# Patient Record
Sex: Male | Born: 1985 | Race: White | Hispanic: No | Marital: Married | Smoking: Never smoker
Health system: Southern US, Community
[De-identification: ages and names within clinical notes are randomized; demographics above are authoritative.]

## PROBLEM LIST (undated history)

## (undated) DIAGNOSIS — I861 Scrotal varices: Secondary | ICD-10-CM

## (undated) DIAGNOSIS — J45909 Unspecified asthma, uncomplicated: Secondary | ICD-10-CM

## (undated) DIAGNOSIS — Z8669 Personal history of other diseases of the nervous system and sense organs: Secondary | ICD-10-CM

## (undated) DIAGNOSIS — G4733 Obstructive sleep apnea (adult) (pediatric): Secondary | ICD-10-CM

## (undated) DIAGNOSIS — K219 Gastro-esophageal reflux disease without esophagitis: Secondary | ICD-10-CM

## (undated) DIAGNOSIS — J309 Allergic rhinitis, unspecified: Secondary | ICD-10-CM

## (undated) HISTORY — DX: Personal history of other diseases of the nervous system and sense organs: Z86.69

## (undated) HISTORY — PX: EYE SURGERY: SHX253

## (undated) HISTORY — PX: TESTICLE SURGERY: SHX794

## (undated) HISTORY — DX: Gastro-esophageal reflux disease without esophagitis: K21.9

## (undated) HISTORY — PX: TONSILLECTOMY AND ADENOIDECTOMY: SUR1326

## (undated) HISTORY — DX: Allergic rhinitis, unspecified: J30.9

## (undated) HISTORY — DX: Scrotal varices: I86.1

## (undated) HISTORY — DX: Unspecified asthma, uncomplicated: J45.909

## (undated) HISTORY — DX: Obstructive sleep apnea (adult) (pediatric): G47.33

---

## 2013-08-30 ENCOUNTER — Ambulatory Visit (INDEPENDENT_AMBULATORY_CARE_PROVIDER_SITE_OTHER): Payer: 59 | Admitting: Family Medicine

## 2013-08-30 ENCOUNTER — Encounter: Payer: Self-pay | Admitting: Family Medicine

## 2013-08-30 ENCOUNTER — Encounter: Payer: Self-pay | Admitting: *Deleted

## 2013-08-30 VITALS — BP 120/78 | HR 78 | Temp 98.1°F | Ht 73.75 in | Wt 320.0 lb

## 2013-08-30 DIAGNOSIS — K219 Gastro-esophageal reflux disease without esophagitis: Secondary | ICD-10-CM

## 2013-08-30 DIAGNOSIS — J309 Allergic rhinitis, unspecified: Secondary | ICD-10-CM

## 2013-08-30 DIAGNOSIS — I861 Scrotal varices: Secondary | ICD-10-CM

## 2013-08-30 DIAGNOSIS — Z0000 Encounter for general adult medical examination without abnormal findings: Secondary | ICD-10-CM

## 2013-08-30 DIAGNOSIS — E669 Obesity, unspecified: Secondary | ICD-10-CM

## 2013-08-30 DIAGNOSIS — Z8669 Personal history of other diseases of the nervous system and sense organs: Secondary | ICD-10-CM

## 2013-08-30 DIAGNOSIS — J45909 Unspecified asthma, uncomplicated: Secondary | ICD-10-CM

## 2013-08-30 HISTORY — DX: Personal history of other diseases of the nervous system and sense organs: Z86.69

## 2013-08-30 HISTORY — DX: Scrotal varices: I86.1

## 2013-08-30 HISTORY — DX: Allergic rhinitis, unspecified: J30.9

## 2013-08-30 LAB — LIPID PANEL
CHOL/HDL RATIO: 4
Cholesterol: 170 mg/dL (ref 0–200)
HDL: 38.4 mg/dL — ABNORMAL LOW (ref >39.00)
LDL CALC: 109 mg/dL — AB (ref 0–99)
Triglycerides: 113 mg/dL (ref 0.0–149.0)
VLDL: 22.6 mg/dL (ref 0.0–40.0)

## 2013-08-30 LAB — HEMOGLOBIN A1C: Hgb A1c MFr Bld: 5.1 % (ref 4.6–6.5)

## 2013-08-30 LAB — TSH: TSH: 0.89 u[IU]/mL (ref 0.35–4.50)

## 2013-08-30 NOTE — Patient Instructions (Signed)
-  We placed a referral for you as discussed to the urologist. It usually takes about 1-2 weeks to process and schedule this referral. If you have not heard from [place] regarding this appointment in 2 weeks please contact our office.  -We have ordered labs or studies at this visit. It can take up to 1-2 weeks for results and processing. We will contact you with instructions IF your results are abnormal. Normal results will be released to your [facility]. If you have not heard from [place] or can not find your results in [facility] in 2 weeks please contact our office.  -PLEASE SIGN UP FOR MYCHART TODAY   We recommend the following healthy lifestyle measures: - eat a healthy diet consisting of lots of vegetables, fruits, beans, nuts, seeds, healthy meats such as white chicken and fish and whole grains.  - avoid fried foods, fast food, processed foods, sodas, red meet and other fattening foods.  - get a least 150 minutes of aerobic exercise per week.   Follow up in: 1 year and as needed

## 2013-08-30 NOTE — Progress Notes (Signed)
No chief complaint on file.   HPI:  Sean Allen is here to establish care. Recently exercising more and in wellness program. Wants referral for varicocele. Evaluated remotely about 8 years ago and surgery offered vs conservative management. Now giving him a  Little trouble from time to time and wants to see urologist here.  Last PCP and physical:   Has the following chronic problems and concerns today:  Patient Active Problem List   Diagnosis Date Noted  . Varicocele, evaluated by urologist in the past 08/30/2013  . Hx of retinal detachment - [facility] 08/30/2013  . Asthma - sees Allergy and Asthma specialist 08/30/2013  . Allergic rhinitis 08/30/2013  . GERD (gastroesophageal reflux disease) 08/30/2013   Health Maintenance: -reports needs physical  ROS: See pertinent positives and negatives per HPI.  Past Medical History  Diagnosis Date  . Asthma   . GERD (gastroesophageal reflux disease)   . Varicocele, evaluated by urologist in the past 08/30/2013  . Allergic rhinitis 08/30/2013  . Hx of retinal detachment - [facility] 08/30/2013    Family History  Problem Relation Age of Onset  . Hyperlipidemia Father   . Hypertension Paternal Grandmother     History   Social History  . Marital Status: Single    Spouse Name: N/A    Number of Children: N/A  . Years of Education: N/A   Social History Main Topics  . Smoking status: Never Smoker   . Smokeless tobacco: None  . Alcohol Use: Yes     Comment: occ 1-2 drinks  . Drug Use: None  . Sexual Activity: None   Other Topics Concern  . 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: lives alone      Spiritual Beliefs: Christian      Lifestyle: regular CV exercise; working on a healthy diet                Current outpatient prescriptions:budesonide-formoterol (SYMBICORT) 160-4.5 MCG/ACT inhaler, Inhale 2 puffs into the lungs 2 (two) times  daily., Disp: , Rfl: ;  esomeprazole (NEXIUM) 40 MG capsule, Take 40 mg by mouth daily at 12 noon., Disp: , Rfl: ;  levocetirizine (XYZAL) 5 MG tablet, Take 5 mg by mouth every evening., Disp: , Rfl: ;  montelukast (SINGULAIR) 10 MG tablet, Take 10 mg by mouth at bedtime., Disp: , Rfl:  Olopatadine HCl (PATANASE NA), Place into the nose daily., Disp: , Rfl:   EXAM:  Filed Vitals:   08/30/13 0831  BP: 120/78  Pulse: 78  Temp: 98.1 F (36.7 C)    Body mass index is 41.38 kg/(m^2).  GENERAL: vitals reviewed and listed above, alert, oriented, appears well hydrated and in no acute distress  HEENT: atraumatic, conjunttiva clear, no obvious abnormalities on inspection of external nose and ears  NECK: no obvious masses on inspection  LUNGS: clear to auscultation bilaterally, no wheezes, rales or rhonchi, good air movement  CV: HRRR, no peripheral edema  MS: moves all extremities without noticeable abnormality  PSYCH: pleasant and cooperative, no obvious depression or anxiety  ASSESSMENT AND PLAN:  Discussed the following assessment and plan:  Varicocele, evaluated bu urologist in the past - Plan: Ambulatory referral to Urology  Hx of retinal detachment - [facility]  Asthma -followed by Asthma and Allergy specialist Allergic rhinitis  GERD (gastroesophageal reflux disease)  Encounter for preventive health examination - Plan: Lipid Panel, Hemoglobin  A1c, TSH  Obesity  -We reviewed the PMH, PSH, FH, SH, Meds and Allergies. -We provided refills for any medications we will prescribe as needed. -We addressed current concerns per orders and patient instructions. -We have asked for records for pertinent exams, studies, vaccines and notes from previous providers. -We have advised patient to follow up per instructions below. -FASTING labs -USPSTF level A and B recs advised, declined STI testing  -Patient advised to return or notify a doctor immediately if symptoms  worsen or persist or new concerns arise.  Patient Instructions  -We placed a referral for you as discussed to the urologist. It usually takes about 1-2 weeks to process and schedule this referral. If you have not heard from [place] regarding this appointment in 2 weeks please contact our office.  -We have ordered labs or studies at this visit. It can take up to 1-2 weeks for results and processing. We will contact you with instructions IF your results are abnormal. Normal results will be released to your [facility]. If you have not heard from [place] or can not find your results in [facility] in 2 weeks please contact our office.  -PLEASE SIGN UP FOR MYCHART TODAY   We recommend the following healthy lifestyle measures: - eat a healthy diet consisting of lots of vegetables, fruits, beans, nuts, seeds, healthy meats such as white chicken and fish and whole grains.  - avoid fried foods, fast food, processed foods, sodas, red meet and other fattening foods.  - get a least 150 minutes of aerobic exercise per week.   Follow up in: 1 year and as needed       Lucretia Kern

## 2013-08-30 NOTE — Progress Notes (Signed)
Pre visit review using our clinic review tool, if applicable. No additional management support is needed unless otherwise documented below in the visit note. 

## 2013-09-10 ENCOUNTER — Encounter: Payer: Self-pay | Admitting: *Deleted

## 2013-10-14 ENCOUNTER — Encounter: Payer: Self-pay | Admitting: *Deleted

## 2013-11-04 ENCOUNTER — Encounter: Payer: Self-pay | Admitting: *Deleted

## 2013-12-07 ENCOUNTER — Ambulatory Visit (INDEPENDENT_AMBULATORY_CARE_PROVIDER_SITE_OTHER): Payer: 59 | Admitting: Family Medicine

## 2013-12-07 ENCOUNTER — Encounter: Payer: Self-pay | Admitting: Family Medicine

## 2013-12-07 VITALS — BP 120/82 | HR 78 | Temp 97.9°F | Ht 73.75 in | Wt 321.5 lb

## 2013-12-07 DIAGNOSIS — J45901 Unspecified asthma with (acute) exacerbation: Secondary | ICD-10-CM

## 2013-12-07 DIAGNOSIS — J988 Other specified respiratory disorders: Secondary | ICD-10-CM

## 2013-12-07 MED ORDER — ALBUTEROL SULFATE HFA 108 (90 BASE) MCG/ACT IN AERS: 2.0000 | INHALATION_SPRAY | RESPIRATORY_TRACT | Status: DC | PRN

## 2013-12-07 MED ORDER — PREDNISONE 20 MG PO TABS: 40.0000 mg | ORAL_TABLET | Freq: Every day | ORAL | Status: DC

## 2013-12-07 MED ORDER — AZITHROMYCIN 250 MG PO TABS: ORAL_TABLET | ORAL | Status: DC

## 2013-12-07 NOTE — Patient Instructions (Signed)
-  As we discussed, we have prescribed a new medication for you at this appointment. We discussed the common and serious potential adverse effects of this medication and you can review these and more with the pharmacist when you pick up your medication.  Please follow the instructions for use carefully and notify us immediately if you have any problems taking this medication.  Melissa Montane for 4 days  -allergy and asthma medications daily  -tyelnol or naproxen per instructions as needed  -follow up as needed and if persists or worsens

## 2013-12-07 NOTE — Progress Notes (Signed)
Pre visit review using our clinic review tool, if applicable. No additional management support is needed unless otherwise documented below in the visit note. 

## 2013-12-07 NOTE — Progress Notes (Signed)
No chief complaint on file.   HPI:  -started: 3-4 days ago, cough is worsening and prouctive -symptoms:nasal congestion, sore throat, cough, chills, malaise -denies:fever, SOB, NVD, tooth pain -has tried: sudafed -sick contacts/travel/risks: denies flu exposure, tick exposure or or Ebola risks -Hx of: allergies, asthma - seeing specialist - meds include patanase, xyzal, symbicort and proair ROS: See pertinent positives and negatives per HPI.  Past Medical History  Diagnosis Date  . Asthma   . GERD (gastroesophageal reflux disease)   . Varicocele, evaluated by urologist in the past 08/30/2013  . Allergic rhinitis 08/30/2013  . Hx of retinal detachment - [facility] 08/30/2013    Past Surgical History  Procedure Laterality Date  . Tonsillectomy and adenoidectomy    . Eye surgery      bilateral retinal detachment  . Testicle surgery      Family History  Problem Relation Age of Onset  . Hyperlipidemia Father   . Hypertension Paternal Grandmother     History   Social History  . Marital Status: Single    Spouse Name: N/A    Number of Children: N/A  . Years of Education: N/A   Social History Main Topics  . Smoking status: Never Smoker   . Smokeless tobacco: None  . Alcohol Use: Yes     Comment: occ 1-2 drinks  . Drug Use: None  . Sexual Activity: None   Other Topics Concern  . 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: lives alone      Spiritual Beliefs: Christian      Lifestyle: regular CV exercise; working on a healthy diet                Current outpatient prescriptions:budesonide-formoterol (SYMBICORT) 160-4.5 MCG/ACT inhaler, Inhale 2 puffs into the lungs 2 (two) times daily., Disp: , Rfl: ;  esomeprazole (NEXIUM) 40 MG capsule, Take 40 mg by mouth daily at 12 noon., Disp: , Rfl: ;  levocetirizine (XYZAL) 5 MG tablet, Take 5 mg by mouth every evening., Disp: , Rfl: ;  montelukast (SINGULAIR) 10 MG  tablet, Take 10 mg by mouth at bedtime., Disp: , Rfl:  NON FORMULARY, Allergy injections, Disp: , Rfl: ;  Olopatadine HCl (PATANASE NA), Place into the nose daily., Disp: , Rfl: ;  Pseudoephedrine HCl (SUDAFED PO), Take by mouth., Disp: , Rfl: ;  albuterol (PROAIR HFA) 108 (90 BASE) MCG/ACT inhaler, Inhale 2 puffs into the lungs every 4 (four) hours as needed for wheezing or shortness of breath., Disp: 1 Inhaler, Rfl: 0 azithromycin (ZITHROMAX) 250 MG tablet, 2 tabs on first day then one tab daily, Disp: 6 tablet, Rfl: 0;  predniSONE (DELTASONE) 20 MG tablet, Take 2 tablets (40 mg total) by mouth daily with breakfast., Disp: 8 tablet, Rfl: 0  EXAM:  Filed Vitals:   12/07/13 1530  BP: 120/82  Pulse: 78  Temp: 97.9 F (36.6 C)    Body mass index is 41.57 kg/(m^2).  GENERAL: vitals reviewed and listed above, alert, oriented, appears well hydrated and in no acute distress  HEENT: atraumatic, conjunttiva clear, no obvious abnormalities on inspection of external nose and ears, normal appearance of ear canals and TMs, clear nasal congestion, mild post oropharyngeal erythema with PND, no tonsillar edema or exudate, no sinus TTP  NECK: no obvious masses on inspection  LUNGS: clear to auscultation bilaterally, no wheezes, rales or rhonchi, good air movement  CV: HRRR, no  peripheral edema  MS: moves all extremities without noticeable abnormality  PSYCH: pleasant and cooperative, no obvious depression or anxiety  ASSESSMENT AND PLAN:  Discussed the following assessment and plan:  Asthma exacerbation - Plan: predniSONE (DELTASONE) 20 MG tablet, azithromycin (ZITHROMAX) 250 MG tablet, albuterol (PROAIR HFA) 108 (90 BASE) MCG/ACT inhaler  Respiratory infection - Plan: predniSONE (DELTASONE) 20 MG tablet, azithromycin (ZITHROMAX) 250 MG tablet, albuterol (PROAIR HFA) 108 (90 BASE) MCG/ACT inhaler  -given HPI and exam findings today, a serious infection or illness is unlikely. We discussed  potential etiologies, with VURI being most likely, and advised supportive care and monitoring. We discussed treatment side effects, likely course, antibiotic misuse, transmission, and signs of developing a serious illness. -of course, we advised to return or notify a doctor immediately if symptoms worsen or persist or new concerns arise.    Patient Instructions  -As we discussed, we have prescribed a new medication for you at this appointment. We discussed the common and serious potential adverse effects of this medication and you can review these and more with the pharmacist when you pick up your medication.  Please follow the instructions for use carefully and notify us immediately if you have any problems taking this medication.  Melissa Montane for 4 days  -allergy and asthma medications daily  -tyelnol or naproxen per instructions as needed  -follow up as needed and if persists or worsens     Bertha Lokken R.

## 2013-12-10 ENCOUNTER — Encounter: Payer: 59 | Attending: Family Medicine | Admitting: *Deleted

## 2013-12-10 ENCOUNTER — Encounter: Payer: Self-pay | Admitting: *Deleted

## 2013-12-10 VITALS — Ht 73.75 in | Wt 319.3 lb

## 2013-12-10 DIAGNOSIS — E669 Obesity, unspecified: Secondary | ICD-10-CM | POA: Insufficient documentation

## 2013-12-10 DIAGNOSIS — Z713 Dietary counseling and surveillance: Secondary | ICD-10-CM | POA: Diagnosis present

## 2013-12-10 DIAGNOSIS — Z6841 Body Mass Index (BMI) 40.0 and over, adult: Secondary | ICD-10-CM | POA: Insufficient documentation

## 2013-12-10 NOTE — Progress Notes (Signed)
Medical Nutrition Therapy:  Appt start time: 0830 end time:  0930.  Assessment:  Patient here today for weight management. Patient is a Adult nurse. He reports that he would like to lose about 100 pounds to improve health. He has recently cut back on soda intake, limits fried foods, eating out less, and increasing intake of vegetables/salads. He has started exercising 3 days weekly at [facility] for at least 30 minutes, and would like to start doing strength exercises. He did lose some weight, but reports that he has gained it back. He is still drinking sweetened drinks (sweet tea, Gatorade) regularly. He would like to lose 100 pounds in 1 year, and while this is possible given patient's calorie requirements, this may not be realistic. A goal of 50 pound weight loss in 6-9 months was set.   MEDICATIONS: See list   DIETARY INTAKE:   Usual eating pattern includes 3 meals and 0-1 snacks per day.  24-hr recall:  B ( AM): English muffin or bagel, peanut butter, homemade jelly OR frosted mini wheat cereal/grits/oatmeal - 1-2 packets  Snk ( AM): None  L ( PM): Sandwich, [place] yogurt, raisins, individual cheese packet, occasionally chips, water and Gatorade Snk ( PM): Chips and salsa, M&M's on weekends D ( PM): Eats out ([place]), grilled chicken/deli meat salad Snk ( PM): None Beverages: Water, Gatorade daily (16-20 oz bottles), coffee (1 tsp cream, sugar), sweet tea (3 quarts in 2-3 weeks)  Usual physical activity: [employer] gym 3 days weekly, cardio 30 minutes, getting into weights  Estimated energy needs: 2000 calories 250 g carbohydrates 125 g protein 56 g fat  Progress Towards Goal(s):  In progress.   Nutritional Diagnosis:  [place]-3.3 Overweight/obesity As related to excessive energy intake from large portions and sweetened drinks.  As evidenced by BMI >30.    Intervention:  Nutrition counseling. We discussed strategies for weight loss, including balancing nutrients  (carbs, protein, fat), portion control, healthy snacks, and exercise. We also discussed realistic expectations for weight loss.   Goals:  1. 1-2 pounds weight loss per week.  2. Limit intake of sweet drinks to <20 ounces daily, cutting this back further if not losing weight.  3. Balance protein, carbs, fat, and vegetables at meals.  4. Monitor portion size.  5. Increase exercise to cardio 45-60 minutes 4-5 days weekly, strength training 2-3 days weekly.   Handouts given during visit include:  Weight loss tips  5 day 1800 calorie meal plan  Meal plan card  Monitoring/Evaluation:  Dietary intake, exercise, and body weight prn.

## 2014-08-31 ENCOUNTER — Encounter: Payer: 59 | Admitting: Family Medicine

## 2014-12-06 ENCOUNTER — Ambulatory Visit (INDEPENDENT_AMBULATORY_CARE_PROVIDER_SITE_OTHER): Payer: 59 | Admitting: Family Medicine

## 2014-12-06 ENCOUNTER — Encounter: Payer: Self-pay | Admitting: Family Medicine

## 2014-12-06 VITALS — BP 120/76 | HR 89 | Temp 98.1°F | Ht 75.0 in | Wt 314.3 lb

## 2014-12-06 DIAGNOSIS — R0683 Snoring: Secondary | ICD-10-CM | POA: Diagnosis not present

## 2014-12-06 DIAGNOSIS — Z0000 Encounter for general adult medical examination without abnormal findings: Secondary | ICD-10-CM | POA: Diagnosis not present

## 2014-12-06 DIAGNOSIS — K219 Gastro-esophageal reflux disease without esophagitis: Secondary | ICD-10-CM | POA: Diagnosis not present

## 2014-12-06 DIAGNOSIS — G4710 Hypersomnia, unspecified: Secondary | ICD-10-CM

## 2014-12-06 DIAGNOSIS — J309 Allergic rhinitis, unspecified: Secondary | ICD-10-CM

## 2014-12-06 DIAGNOSIS — Z23 Encounter for immunization: Secondary | ICD-10-CM

## 2014-12-06 DIAGNOSIS — R400 Somnolence: Secondary | ICD-10-CM

## 2014-12-06 LAB — BASIC METABOLIC PANEL
BUN: 13 mg/dL (ref 6–23)
CO2: 31 meq/L (ref 19–32)
CREATININE: 1.14 mg/dL (ref 0.40–1.50)
Calcium: 9.8 mg/dL (ref 8.4–10.5)
Chloride: 102 mEq/L (ref 96–112)
GFR: 81.6 mL/min (ref >60.00)
GLUCOSE: 92 mg/dL (ref 70–99)
Potassium: 4.8 mEq/L (ref 3.5–5.1)
Sodium: 138 mEq/L (ref 135–145)

## 2014-12-06 LAB — LIPID PANEL
CHOLESTEROL: 168 mg/dL (ref 0–200)
HDL: 45.7 mg/dL (ref >39.00)
LDL CALC: 105 mg/dL — AB (ref 0–99)
NonHDL: 122.25
TRIGLYCERIDES: 87 mg/dL (ref 0.0–149.0)
Total CHOL/HDL Ratio: 4
VLDL: 17.4 mg/dL (ref 0.0–40.0)

## 2014-12-06 LAB — HEMOGLOBIN A1C: Hgb A1c MFr Bld: 5.4 % (ref 4.6–6.5)

## 2014-12-06 NOTE — Progress Notes (Signed)
Pre visit review using our clinic review tool, if applicable. No additional management support is needed unless otherwise documented below in the visit note. 

## 2014-12-06 NOTE — Progress Notes (Signed)
HPI:  Here for CPE:  -Concerns and/or follow up today:  GERD/asthma: -on protonix 40mg  from his allergist -denies any hx of stricture or esophagitis -he wants to stop PPI but reports his asthma doctor told him he needs to take this  ? OSA: -snores a lot -usually feels well rested but sometimes is tired during the day -wakes other people up by snoring -denies SOB, choking in sleep -wants to see sleep specialist for testing for sleep apnea  -Diet: variety of foods, balance and well rounded, larger portion sizes  -Exercise: regular exercise - [employer] trainer session set up  -Diabetes and Dyslipidemia Screening: FASTING  -Hx of HTN: no  -Vaccines: Tdap today, wants to get flu shot at his office  -sexual activity: yes, male partner, no new partners  -wants STI testing, Hep C screening (if born 08-1961): no  -FH colon or prstate ca: see FH Last colon cancer screening: n/a Last prostate ca screening: n/a  -Alcohol, Tobacco, drug use: see social history  Review of Systems - no fevers, unintentional weight loss, vision loss, hearing loss, chest pain, sob, hemoptysis, melena, hematochezia, hematuria, genital discharge, changing or concerning skin lesions, bleeding, bruising, loc, thoughts of self harm or SI  Past Medical History  Diagnosis Date  . Asthma   . GERD (gastroesophageal reflux disease)   . Varicocele, evaluated by urologist in the past 08/30/2013  . Allergic rhinitis 08/30/2013  . Hx of retinal detachment - [facility] 08/30/2013    Past Surgical History  Procedure Laterality Date  . Tonsillectomy and adenoidectomy    . Eye surgery      bilateral retinal detachment  . Testicle surgery      Family History  Problem Relation Age of Onset  . Hyperlipidemia Father   . Hypertension Paternal Grandmother     Social History   Social History  . Marital Status: Single    Spouse Name: N/A  . Number of Children: N/A  . Years of Education:  N/A   Social History Main Topics  . Smoking status: Never Smoker   . Smokeless tobacco: None  . Alcohol Use: Yes     Comment: occ 1-2 drinks  . Drug Use: None  . Sexual Activity: Not Asked   Other Topics Concern  . 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  albuterol (PROAIR HFA) 108 (90 BASE) MCG/ACT inhaler, Inhale 2 puffs into the lungs every 4 (four) hours as needed for wheezing or shortness of breath., Disp: 1 Inhaler, Rfl: 0 .  budesonide-formoterol (SYMBICORT) 160-4.5 MCG/ACT inhaler, Inhale 2 puffs into the lungs 2 (two) times daily., Disp: , Rfl:  .  levocetirizine (XYZAL) 5 MG tablet, Take 5 mg by mouth every evening., Disp: , Rfl:  .  montelukast (SINGULAIR) 10 MG tablet, Take 10 mg by mouth at bedtime., Disp: , Rfl:  .  NON FORMULARY, Allergy injections, Disp: , Rfl:  .  Olopatadine HCl (PATANASE NA), Place into the nose daily., Disp: , Rfl:  .  pantoprazole (PROTONIX) 40 MG tablet, Take 40 mg by mouth daily., Disp: , Rfl:   EXAM:  Filed Vitals:   12/06/14 0835  BP: 120/76  Pulse: 89  Temp: 98.1 F (36.7 C)  TempSrc: Oral  Height: 6\' 3"  (1.905 m)  Weight: 314 lb 4.8 oz (142.566 kg)    Estimated body mass index is 39.28 kg/(m^2) as calculated from the following:   Height as of this encounter: 6\' 3"  (1.905 m).   Weight as of this encounter: 314 lb 4.8 oz (142.566 kg).  GENERAL: vitals reviewed and listed below, alert, oriented, appears well hydrated and in no acute distress  HEENT: head atraumatic, PERRLA, normal appearance of eyes, ears, nose and mouth. moist mucus membranes.  NECK: supple, no masses or lymphadenopathy  LUNGS: clear to auscultation bilaterally, no rales, rhonchi or wheeze  CV: HRRR, no peripheral edema or cyanosis, normal  pedal pulses  ABDOMEN: bowel sounds normal, soft, non tender to palpation, no masses, no rebound or guarding  GU: declined  RECTAL: deferred  SKIN: no rash or abnormal lesions  MS: normal gait, moves all extremities normally  NEURO: CN II-XII grossly intact, normal muscle strength and sensation to light touch on extremities  PSYCH: normal affect, pleasant and cooperative  ASSESSMENT AND PLAN:  Discussed the following assessment and plan:  Visit for preventive health examination - Plan: HIV antibody (with reflex), Lipid Panel, Hemoglobin K0S, Basic metabolic panel  Allergic rhinitis, unspecified allergic rhinitis type  Gastroesophageal reflux disease, esophagitis presence not specified  Snoring - Plan: Ambulatory referral to Pulmonology  Daytime somnolence - Plan: Ambulatory referral to Pulmonology   -Discussed and advised all [place] preventive services health task force level A and B recommendations for age, sex and risks.  -Advised at least 150 minutes of exercise per week and a healthy diet low in saturated fats and sweets and consisting of fresh fruits and vegetables, lean meats such as fish and white chicken and whole grains.  -Tdap  -discussed risks/benefits PPI and other options, he decided to do a trial of ranitidine or lower dose and work on lifestyle  -referred to pulm per his request for his concerns for OSA, symptoms mild and advise continued work on diet and exercise  -FASTING labs, studies and vaccines per orders this encounter  Patient advised to return to clinic immediately if symptoms worsen or persist or new concerns.  Patient Instructions  BEFORE YOU LEAVE: -labs -follow up yearly and as needed -Tdap  We recommend the following healthy lifestyle measures: - eat a healthy diet consisting of small portions of vegetables, fruits, beans, nuts, seeds, healthy meats such as white chicken and fish  - avoid fried foods, starches, sweets, fast food, processed  foods, sodas, red meet and other fattening foods.  - get a least 150-300 minutes of aerobic exercise per week.      No Follow-up on file.   Colin Benton R.

## 2014-12-06 NOTE — Patient Instructions (Signed)
BEFORE YOU LEAVE: -labs -follow up yearly and as needed -Tdap  We recommend the following healthy lifestyle measures: - eat a healthy diet consisting of small portions of vegetables, fruits, beans, nuts, seeds, healthy meats such as white chicken and fish  - avoid fried foods, starches, sweets, fast food, processed foods, sodas, red meet and other fattening foods.  - get a least 150-300 minutes of aerobic exercise per week.

## 2014-12-06 NOTE — Addendum Note (Signed)
Addended by: Agnes Lawrence on: 12/06/2014 09:22 AM   Modules accepted: Orders

## 2014-12-07 LAB — HIV ANTIBODY (ROUTINE TESTING W REFLEX): HIV 1&2 Ab, 4th Generation: NONREACTIVE

## 2015-01-27 ENCOUNTER — Ambulatory Visit (INDEPENDENT_AMBULATORY_CARE_PROVIDER_SITE_OTHER): Payer: 59 | Admitting: Pulmonary Disease

## 2015-01-27 ENCOUNTER — Encounter: Payer: Self-pay | Admitting: Pulmonary Disease

## 2015-01-27 VITALS — BP 130/78 | HR 81 | Temp 98.0°F | Ht 75.0 in | Wt 315.8 lb

## 2015-01-27 DIAGNOSIS — R0683 Snoring: Secondary | ICD-10-CM | POA: Diagnosis not present

## 2015-01-27 DIAGNOSIS — F5112 Insufficient sleep syndrome: Secondary | ICD-10-CM | POA: Diagnosis not present

## 2015-01-27 DIAGNOSIS — E669 Obesity, unspecified: Secondary | ICD-10-CM | POA: Diagnosis not present

## 2015-01-27 NOTE — Progress Notes (Deleted)
   Subjective:    Patient ID: RHYLAND HINDERLITER, male    DOB: December 10, 1985, 29 y.o.   MRN: [id number]  HPI    Review of Systems  Constitutional: Negative for fever and unexpected weight change.  HENT: Positive for postnasal drip and rhinorrhea. Negative for congestion, dental problem, ear pain, nosebleeds, sinus pressure, sneezing, sore throat and trouble swallowing.   Eyes: Negative for redness and itching.  Respiratory: Negative for cough, chest tightness, shortness of breath and wheezing.   Cardiovascular: Negative for palpitations and leg swelling.  Gastrointestinal: Negative for nausea and vomiting.  Genitourinary: Negative for dysuria.  Musculoskeletal: Negative for joint swelling.  Skin: Negative for rash.  Neurological: Positive for headaches.  Hematological: Does not bruise/bleed easily.  Psychiatric/Behavioral: Negative for dysphoric mood. The patient is not nervous/anxious.        Objective:   Physical Exam        Assessment & Plan:

## 2015-01-27 NOTE — Patient Instructions (Signed)
Will arrange for home sleep study Will call to arrange for follow up after sleep study reviewed  

## 2015-01-27 NOTE — Progress Notes (Signed)
Chief Complaint  Patient presents with  . SLEEP CONSULT    pt referred by Dr. Maudie Mercury for snoring. pt states he snores while sleeping.  pt states if he gets atleast 8 hrs of sleep he is not tired if he gets less than sometimes he struggles during the day. Epworth score: 3    History of Present Illness: Sean Allen is a 29 y.o. male for evaluation of sleep problems.  His family has told him that he snores, and stops breathing while asleep.  This has been going on for a while, but is getting worse.  He has several family members who have sleep apnea.  He will get a dry mouth at night, and frequent dreams.  He can't sleep on his stomach.  He has been told that he grinds his teeth occasionally at night, but doesn't need a mouth guard.  He goes to sleep between 10 pm and midnight.  He falls asleep instantly.  He usually sleeps through the night.  He gets out of bed at 6 am.  He feels tired in the morning, and this is worse when he goes to bed closer to midnight.  He denies morning headache.  He does not use anything to help him fall sleep.  He will drink several cups of coffee to help stay awake during the day.  He will fall asleep in the evening when watching TV.  He denies sleep walking, sleep talking, or nightmares.  There is no history of restless legs.  He denies sleep hallucinations, sleep paralysis, or cataplexy.  The Epworth score is 3 out of 24.   Sean Allen  has a past medical history of Asthma; GERD (gastroesophageal reflux disease); Varicocele, evaluated by urologist in the past (08/30/2013); Allergic rhinitis (08/30/2013); and Hx of retinal detachment - [facility] (08/30/2013).  Sean Allen  has past surgical history that includes Tonsillectomy and adenoidectomy; Eye surgery; and Testicle surgery.  Prior to Admission medications   Medication Sig Start Date End Date Taking? Authorizing Provider  albuterol (PROAIR HFA) 108 (90 BASE) MCG/ACT inhaler Inhale 2 puffs into  the lungs every 4 (four) hours as needed for wheezing or shortness of breath. 12/07/13  Yes Lucretia Kern, DO  budesonide-formoterol (SYMBICORT) 160-4.5 MCG/ACT inhaler Inhale 2 puffs into the lungs 2 (two) times daily.   Yes Historical Provider, MD  levocetirizine (XYZAL) 5 MG tablet Take 5 mg by mouth every evening.   Yes Historical Provider, MD  montelukast (SINGULAIR) 10 MG tablet Take 10 mg by mouth at bedtime.   Yes Historical Provider, MD  NON FORMULARY Allergy injections   Yes Historical Provider, MD  Olopatadine HCl (PATANASE NA) Place into the nose daily.   Yes Historical Provider, MD  pantoprazole (PROTONIX) 40 MG tablet Take 40 mg by mouth daily.   Yes Historical Provider, MD    Allergies  Allergen Reactions  . Dust Mite Extract   . Mold Extract [Trichophyton]     His family history includes Hyperlipidemia in his father; Hypertension in his paternal grandmother.  He  reports that he has never smoked. He does not have any smokeless tobacco history on file. He reports that he drinks alcohol.  Review of Systems  Constitutional: Negative for fever and unexpected weight change.  HENT: Positive for postnasal drip and rhinorrhea. Negative for congestion, dental problem, ear pain, nosebleeds, sinus pressure, sneezing, sore throat and trouble swallowing.   Eyes: Negative for redness and itching.  Respiratory: Negative for cough,  chest tightness, shortness of breath and wheezing.   Cardiovascular: Negative for palpitations and leg swelling.  Gastrointestinal: Negative for nausea and vomiting.  Genitourinary: Negative for dysuria.  Musculoskeletal: Negative for joint swelling.  Skin: Negative for rash.  Neurological: Positive for headaches.  Hematological: Does not bruise/bleed easily.  Psychiatric/Behavioral: Negative for dysphoric mood. The patient is not nervous/anxious.    Physical Exam: BP 130/78 mmHg  Pulse 81  Temp(Src) 98 F (36.7 C) (Oral)  Ht 6\' 3"  (1.905 m)  Wt 315 lb  12.8 oz (143.246 kg)  BMI 39.47 kg/m2  SpO2 98%  General - No distress ENT - No sinus tenderness, no oral exudate, no LAN, no thyromegaly, TM clear, pupils equal/reactive, MP 4, high arched palate, enlarged tongue, overbite Cardiac - s1s2 regular, no murmur, pulses symmetric Chest - No wheeze/rales/dullness, good air entry, normal respiratory excursion Back - No focal tenderness Abd - Soft, non-tender, no organomegaly, + bowel sounds Ext - No edema Neuro - Normal strength, cranial nerves intact Skin - No rashes Psych - Normal mood, and behavior  Discussion: He has snoring, sleep disruption, witnessed apnea, and daytime sleepiness.  He has family history of sleep apnea.  His BMI is > 35.  I am concerned he could have sleep apnea.  We discussed how sleep apnea can affect various health problems including risks for hypertension, cardiovascular disease, and diabetes.  We also discussed how sleep disruption can increase risks for accident, such as while driving.  Weight loss as a means of improving sleep apnea was also reviewed.  Additional treatment options discussed were CPAP therapy, oral appliance, and surgical intervention.  Assessment/plan:  Snoring with concern for obstructive sleep apnea. Plan: - will arrange for home sleep study pending insurance approval  Obesity. Plan: - discussed importance of weight loss  Insufficient sleep. Plan: - emphasized importance of allowing enough time for sleep, and explained that average sleep is 7 to 8 hours  Asthma. Does not seem to be causing nocturnal symptoms at this time, and seems well controlled at present. Plan: - he is followed by Dr. Mosetta Anis with [facility]   Chesley Mires, M.D. Pager [phone]

## 2015-02-10 DIAGNOSIS — G4733 Obstructive sleep apnea (adult) (pediatric): Secondary | ICD-10-CM | POA: Diagnosis not present

## 2015-02-14 ENCOUNTER — Encounter: Payer: Self-pay | Admitting: Pulmonary Disease

## 2015-02-14 ENCOUNTER — Telehealth: Payer: Self-pay | Admitting: Pulmonary Disease

## 2015-02-14 DIAGNOSIS — G4733 Obstructive sleep apnea (adult) (pediatric): Secondary | ICD-10-CM | POA: Insufficient documentation

## 2015-02-14 HISTORY — DX: Obstructive sleep apnea (adult) (pediatric): G47.33

## 2015-02-14 NOTE — Telephone Encounter (Signed)
HST 02/10/15 >> AHI 44.4, SaO2 low 76%.  Will have my nurse inform pt that sleep study shows severe sleep apnea.  Options are 1) CPAP now, 2) ROV first.  If He is agreeable to CPAP, then please send order for auto CPAP range 5 to 15 cm H2O with heated humidity and mask of choice.  Have download sent 1 month after starting CPAP and set up ROV 2 months after starting CPAP.

## 2015-02-15 DIAGNOSIS — G4733 Obstructive sleep apnea (adult) (pediatric): Secondary | ICD-10-CM | POA: Diagnosis not present

## 2015-02-15 NOTE — Telephone Encounter (Signed)
Pt is aware of sleep study results. Wants to come in for OV to discuss. He has been scheduled to see TP on 02/28/15 at 4pm.

## 2015-02-15 NOTE — Telephone Encounter (Signed)
[phone], pt cb

## 2015-02-15 NOTE — Telephone Encounter (Signed)
LMTCB

## 2015-02-15 NOTE — Telephone Encounter (Signed)
lmtcb for pt.  

## 2015-02-15 NOTE — Telephone Encounter (Signed)
[phone], pt cb

## 2015-02-20 ENCOUNTER — Other Ambulatory Visit: Payer: Self-pay | Admitting: *Deleted

## 2015-02-20 DIAGNOSIS — R0683 Snoring: Secondary | ICD-10-CM

## 2015-02-28 ENCOUNTER — Ambulatory Visit: Payer: 59 | Admitting: Adult Health

## 2015-03-07 ENCOUNTER — Ambulatory Visit (INDEPENDENT_AMBULATORY_CARE_PROVIDER_SITE_OTHER): Payer: 59 | Admitting: Adult Health

## 2015-03-07 ENCOUNTER — Encounter: Payer: Self-pay | Admitting: Adult Health

## 2015-03-07 VITALS — BP 132/86 | HR 76 | Temp 98.0°F | Ht 75.0 in | Wt 320.0 lb

## 2015-03-07 DIAGNOSIS — G4733 Obstructive sleep apnea (adult) (pediatric): Secondary | ICD-10-CM

## 2015-03-07 NOTE — Patient Instructions (Signed)
Begin CPAP At bedtime   Goal is to wear 6hr each night.  Work on weight loss.  Do not drive if sleepy  Download in 1 month  Follow up Dr. Halford Chessman  2 months and As needed

## 2015-03-07 NOTE — Assessment & Plan Note (Signed)
Severe OSA on HST   Plan  Begin CPAP At bedtime    Goal is to wear 6hr each night.  Work on weight loss.  Do not drive if sleepy  Download in 1 month  Follow up Dr. Halford Chessman  2 months and As needed

## 2015-03-07 NOTE — Progress Notes (Signed)
Subjective:    Patient ID: Sean Allen, male    DOB: 10/01/1985, 29 y.o.   MRN: [id number]  HPI 29 yo male morbidly obese seen for sleep consult 01/27/15 with Dr. Halford Chessman     TESTS HST 02/10/15 >> AHI 44.4, SaO2 low 76%.   03/07/2015  Follow up : Severe OSA  PT returns for a 1 month follow up . Seen last month for sleep consult for snoring and daytime  Sleepiness. He was set up for a HST , that showed severe OSA with AHI 44, SaO2 76%.  We discussed his results and reviewed possible treatment options including wt loss, and CPAP  He would like to proceed with CPAP support at bedtime.  We discussed wt loss, he has set up to start with personal trainer.   He denies chest pain, orthopnea, or edema .     Past Medical History  Diagnosis Date  . Asthma   . GERD (gastroesophageal reflux disease)   . Varicocele, evaluated by urologist in the past 08/30/2013  . Allergic rhinitis 08/30/2013  . Hx of retinal detachment - [facility] 08/30/2013  . OSA (obstructive sleep apnea) 02/14/2015   Current Outpatient Prescriptions on File Prior to Visit  Medication Sig Dispense Refill  . albuterol (PROAIR HFA) 108 (90 BASE) MCG/ACT inhaler Inhale 2 puffs into the lungs every 4 (four) hours as needed for wheezing or shortness of breath. 1 Inhaler 0  . budesonide-formoterol (SYMBICORT) 160-4.5 MCG/ACT inhaler Inhale 2 puffs into the lungs 2 (two) times daily.    Marland Kitchen levocetirizine (XYZAL) 5 MG tablet Take 5 mg by mouth every evening.    . montelukast (SINGULAIR) 10 MG tablet Take 10 mg by mouth at bedtime.    . NON FORMULARY Allergy injections    . Olopatadine HCl (PATANASE NA) Place into the nose daily.    . pantoprazole (PROTONIX) 40 MG tablet Take 40 mg by mouth daily.     No current facility-administered medications on file prior to visit.     Review of Systems Constitutional:   No  weight loss, night sweats,  Fevers, chills, fatigue, or  Lassitude.+snoring   HEENT:   No  headaches,  Difficulty swallowing,  Tooth/dental problems, or  Sore throat,                No sneezing, itching, ear ache, nasal congestion, post nasal drip,   CV:  No chest pain,  Orthopnea, PND, swelling in lower extremities, anasarca, dizziness, palpitations, syncope.   GI  No heartburn, indigestion, abdominal pain, nausea, vomiting, diarrhea, change in bowel habits, loss of appetite, bloody stools.   Resp: No shortness of breath with exertion or at rest.  No excess mucus, no productive cough,  No non-productive cough,  No coughing up of blood.  No change in color of mucus.  No wheezing.  No chest wall deformity  Skin: no rash or lesions.  GU: no dysuria, change in color of urine, no urgency or frequency.  No flank pain, no hematuria   MS:  No joint pain or swelling.  No decreased range of motion.  No back pain.  Psych:  No change in mood or affect. No depression or anxiety.  No memory loss.         Objective:   Physical Exam GEN: A/Ox3; pleasant , NAD, morbidly obese   HEENT:  [place]/AT,  EACs-clear, TMs-wnl, NOSE-clear, THROAT-clear, no lesions, no postnasal drip or exudate noted. Class 3 MP airway  NECK:  Supple w/ fair ROM; no JVD; normal carotid impulses w/o bruits; no thyromegaly or nodules palpated; no lymphadenopathy.  RESP  Clear  P & A; w/o, wheezes/ rales/ or rhonchi.no accessory muscle use, no dullness to percussion  CARD:  RRR, no m/r/g  , no peripheral edema, pulses intact, no cyanosis or clubbing.  GI:   Soft & nt; nml bowel sounds; no organomegaly or masses detected.  Musco: Warm bil, no deformities or joint swelling noted.   Neuro: alert, no focal deficits noted.    Skin: Warm, no lesions or rashes         Assessment & Plan:

## 2015-03-07 NOTE — Assessment & Plan Note (Signed)
Work on weight loss.

## 2015-03-09 NOTE — Progress Notes (Signed)
Reviewed and agree with assessment/plan. 

## 2015-04-12 ENCOUNTER — Telehealth: Payer: Self-pay | Admitting: Family Medicine

## 2015-04-12 NOTE — Telephone Encounter (Signed)
Call Id: [id number] [facility] [place] Patient Name: MAICHAEL [facility]) BOY TE DOB: Aug 30, 1985 Initial Comment Caller states he has left side upper chest tightness and congestion Nurse Assessment Nurse: Vallery Sa, RN, Cathy Date/Time (Eastern Time): 04/12/2015 8:54:10 AM Confirm and document reason for call. If symptomatic, describe symptoms. ---Caller states he developed felt a pull in his chest while doing yard work 5 days ago and continues to have mild pain. He developed chest congestion this morning. No breathing difficulty. No fever. Has the patient traveled out of the country within the last 30 days? ---No Does the patient have any new or worsening symptoms? ---Yes Will a triage be completed? ---Yes Related visit to physician within the last 2 weeks? ---No Does the PT have any chronic conditions? (i.e. diabetes, asthma, etc.) ---Yes List chronic conditions. ---Sleep Apnea (CPAP machine) , Allergies Is this a behavioral health or substance abuse call? ---No Guidelines Guideline Title Affirmed Question Affirmed Notes Chest Injury Followed a chest injury Common Cold Cold with no complications (all triage questions negative) Final Disposition User See Within 24 Hours Referrals No appointment available. He will go to [facility] Disagree/Comply: Comply

## 2015-04-13 NOTE — Telephone Encounter (Signed)
Called and Pt spoke with pt and pt states his symptoms resolved.  Pt would like to get a follow up with Dr. Maudie Mercury to discuss possibly getting some labs. Pt scheduled for 1.3.2017.  Pt verbalized understanding.

## 2015-04-18 ENCOUNTER — Ambulatory Visit (INDEPENDENT_AMBULATORY_CARE_PROVIDER_SITE_OTHER): Payer: 59 | Admitting: Family Medicine

## 2015-04-18 ENCOUNTER — Encounter: Payer: Self-pay | Admitting: Family Medicine

## 2015-04-18 VITALS — BP 136/88 | HR 79 | Temp 98.1°F | Ht 75.0 in | Wt 318.7 lb

## 2015-04-18 DIAGNOSIS — J45909 Unspecified asthma, uncomplicated: Secondary | ICD-10-CM

## 2015-04-18 DIAGNOSIS — J3089 Other allergic rhinitis: Secondary | ICD-10-CM | POA: Diagnosis not present

## 2015-04-18 DIAGNOSIS — R0789 Other chest pain: Secondary | ICD-10-CM

## 2015-04-18 DIAGNOSIS — J301 Allergic rhinitis due to pollen: Secondary | ICD-10-CM | POA: Diagnosis not present

## 2015-04-18 DIAGNOSIS — G4733 Obstructive sleep apnea (adult) (pediatric): Secondary | ICD-10-CM | POA: Diagnosis not present

## 2015-04-18 DIAGNOSIS — K219 Gastro-esophageal reflux disease without esophagitis: Secondary | ICD-10-CM

## 2015-04-18 DIAGNOSIS — J3081 Allergic rhinitis due to animal (cat) (dog) hair and dander: Secondary | ICD-10-CM | POA: Diagnosis not present

## 2015-04-18 DIAGNOSIS — E669 Obesity, unspecified: Secondary | ICD-10-CM

## 2015-04-18 NOTE — Progress Notes (Signed)
Pre visit review using our clinic review tool, if applicable. No additional management support is needed unless otherwise documented below in the visit note. 

## 2015-04-18 NOTE — Progress Notes (Signed)
HPI:  Acute issue:  Atypical CP: -has been working out and did some heavy work in the yard and developed soreness in area of L pectoralis muscles last week -was soreness when pressed in this are and when did certain movements with arms -pain was better with heat/ice and aleve -denies pain with aerobic activity that improved with rest, jaw pain, radiation of pain, SOB, DOE, nausea,k palpitations -he went to [facility] but did not feel like waiting, now pain is resolved -he thought it was a pulled muscle but was anxious about his heart  GERD/asthma: -on protonix 40mg  from his allergist -denies any hx of stricture or esophagitis -he wants to stop PPI but reports his asthma doctor told him he needs to take this  Obesity: -working on diet and starting exercise program with a trainer  OSA: -severe by testing 01/2015 -seeing pulm, on CPAP now -feel energy has improved  ROS: See pertinent positives and negatives per HPI.  Past Medical History  Diagnosis Date  . Asthma   . GERD (gastroesophageal reflux disease)   . Varicocele, evaluated by urologist in the past 08/30/2013  . Allergic rhinitis 08/30/2013  . Hx of retinal detachment - [facility] 08/30/2013  . OSA (obstructive sleep apnea) 02/14/2015    Past Surgical History  Procedure Laterality Date  . Tonsillectomy and adenoidectomy    . Eye surgery      bilateral retinal detachment  . Testicle surgery      Family History  Problem Relation Age of Onset  . Hyperlipidemia Father   . Hypertension Paternal Grandmother     Social History   Social History  . Marital Status: Single    Spouse Name: N/A  . Number of Children: N/A  . Years of Education: N/A   Social History Main Topics  . Smoking status: Never Smoker   . Smokeless tobacco: None  . Alcohol Use: 0.0 oz/week    0 Standard drinks or equivalent per week     Comment: occ 1-2 drinks  . Drug Use: None  . Sexual Activity: Not Asked   Other Topics  Concern  . 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  albuterol (PROAIR HFA) 108 (90 BASE) MCG/ACT inhaler, Inhale 2 puffs into the lungs every 4 (four) hours as needed for wheezing or shortness of breath., Disp: 1 Inhaler, Rfl: 0 .  budesonide-formoterol (SYMBICORT) 160-4.5 MCG/ACT inhaler, Inhale 2 puffs into the lungs 2 (two) times daily., Disp: , Rfl:  .  levocetirizine (XYZAL) 5 MG tablet, Take 5 mg by mouth every evening., Disp: , Rfl:  .  montelukast (SINGULAIR) 10 MG tablet, Take 10 mg by mouth at bedtime., Disp: , Rfl:  .  NON FORMULARY, Allergy injections, Disp: , Rfl:  .  Olopatadine HCl (PATANASE NA), Place into the nose daily., Disp: , Rfl:  .  pantoprazole (PROTONIX) 40 MG tablet, Take 40 mg by mouth daily., Disp: , Rfl:   EXAM:  Filed Vitals:   04/18/15 0935  BP: 136/88  Pulse: 79  Temp: 98.1 F (36.7 C)    Body mass index is 39.83 kg/(m^2).  GENERAL: vitals reviewed and listed above, alert, oriented,  appears well hydrated and in no acute distress  HEENT: atraumatic, conjunttiva clear, no obvious abnormalities on inspection of external nose and ears  NECK: no obvious masses on inspection  LUNGS: clear to auscultation bilaterally, no wheezes, rales or rhonchi, good air movement  CV: HRRR, no peripheral edema  MS: moves all extremities without noticeable abnormality, TTP mild in L pec major muscle  PSYCH: pleasant and cooperative, no obvious depression or anxiety  ASSESSMENT AND PLAN:  Discussed the following assessment and plan:  Atypical chest pain - Plan: Exercise Tolerance Test -we discussed possible serious and likely etiologies, workup and treatment, treatment risks and return precautions - highly suspect muscle strain given symptoms and  reproducible pain on exam -after this discussion, Darwyn opted for supportive care, stress test out of abundance of caution given his PMH and his worries -of course, we advised Marbin  to return or notify a doctor immediately if symptoms worsen or persist or new concerns arise - discussed features of ischemic chest pain.  Gastroesophageal reflux disease, esophagitis presence not specified -? Of, stable  Asthma, unspecified asthma severity, uncomplicated -stable  OSA (obstructive sleep apnea) -symptoms improving on treatment  Obesity -lifestyle recs  -Patient advised to return or notify a doctor immediately if symptoms worsen or persist or new concerns arise.  There are no Patient Instructions on file for this visit.   Colin Benton R.

## 2015-04-18 NOTE — Patient Instructions (Signed)
BEFORE YOU LEAVE: -follow up appointment in 4-6 months  -We placed a referral for you as discussed for the stress test. It usually takes about 1-2 weeks to process and schedule this referral. If you have not heard from [place] regarding this appointment in 2 weeks please contact our office.  We recommend the following healthy lifestyle measures: - eat a healthy whole foods diet consisting of regular small meals composed of vegetables, fruits, beans, nuts, seeds, healthy meats such as white chicken and fish and whole grains.  - avoid sweets, white starchy foods, fried foods, fast food, processed foods, sodas, red meet and other fattening foods.  - get a least 150-300 minutes of aerobic exercise per week.

## 2015-04-19 MED FILL — SYMBICORT 160-4.5 MCG INH: 160-4.5 | 30 days supply | Qty: 10 | Fill #0

## 2015-04-19 MED FILL — VENTOLIN HFA 90 MCG INHALER: 108 (90 BAS | 25 days supply | Qty: 18 | Fill #0

## 2015-04-22 DIAGNOSIS — G4733 Obstructive sleep apnea (adult) (pediatric): Secondary | ICD-10-CM | POA: Diagnosis not present

## 2015-04-24 ENCOUNTER — Ambulatory Visit: Payer: 59 | Admitting: Adult Health

## 2015-04-25 DIAGNOSIS — J3089 Other allergic rhinitis: Secondary | ICD-10-CM | POA: Diagnosis not present

## 2015-04-25 DIAGNOSIS — J3081 Allergic rhinitis due to animal (cat) (dog) hair and dander: Secondary | ICD-10-CM | POA: Diagnosis not present

## 2015-04-27 ENCOUNTER — Ambulatory Visit (INDEPENDENT_AMBULATORY_CARE_PROVIDER_SITE_OTHER): Payer: 59 | Admitting: Pulmonary Disease

## 2015-04-27 ENCOUNTER — Encounter: Payer: Self-pay | Admitting: Pulmonary Disease

## 2015-04-27 ENCOUNTER — Telehealth (HOSPITAL_COMMUNITY): Payer: Self-pay

## 2015-04-27 VITALS — BP 126/74 | HR 61 | Ht 75.0 in | Wt 321.6 lb

## 2015-04-27 DIAGNOSIS — G4733 Obstructive sleep apnea (adult) (pediatric): Secondary | ICD-10-CM | POA: Diagnosis not present

## 2015-04-27 MED ORDER — FLUTICASONE FUROATE-VILANTEROL 100-25 MCG/INH IN AEPB: 1.0000 | INHALATION_SPRAY | Freq: Every day | RESPIRATORY_TRACT | Status: DC

## 2015-04-27 NOTE — Addendum Note (Signed)
Addended by: Virl Cagey on: 04/27/2015 05:03 PM   Modules accepted: Orders, Medications

## 2015-04-27 NOTE — Telephone Encounter (Signed)
Encounter complete. 

## 2015-04-27 NOTE — Patient Instructions (Signed)
Follow up in 1 year.

## 2015-04-27 NOTE — Progress Notes (Signed)
Current Outpatient Prescriptions on File Prior to Visit  Medication Sig  . albuterol (PROAIR HFA) 108 (90 BASE) MCG/ACT inhaler Inhale 2 puffs into the lungs every 4 (four) hours as needed for wheezing or shortness of breath.  . budesonide-formoterol (SYMBICORT) 160-4.5 MCG/ACT inhaler Inhale 2 puffs into the lungs 2 (two) times daily.  Marland Kitchen levocetirizine (XYZAL) 5 MG tablet Take 5 mg by mouth every evening.  . montelukast (SINGULAIR) 10 MG tablet Take 10 mg by mouth at bedtime.  . NON FORMULARY Allergy injections  . Olopatadine HCl (PATANASE NA) Place into the nose daily.  . pantoprazole (PROTONIX) 40 MG tablet Take 40 mg by mouth daily.   No current facility-administered medications on file prior to visit.     Chief Complaint  Patient presents with  . Follow-up    pt. states he wears CPAP 6-7 hr. everynight. feels pressure is good. no supplies needed. feels that it drys his mouth out. DME:[facility]     Tests HST 02/10/15 >> AHI 44.4, SaO2 low 76%. Auto CPAP 03/22/15 to 04/26/15 >> used on 33 of 36 nights with average 6 hrs and 58 min.  Average AHI is 1.8 with median CPAP 9 cm H2O and 95 th percentile CPAP 12 cm H20.  Past medical hx Asthma, GERD, Allergies  Past surgical hx, Allergies, Family hx, Social hx all reviewed.  Vital Signs BP 126/74 mmHg  Pulse 61  Ht 6\' 3"  (1.905 m)  Wt 321 lb 9.6 oz (145.877 kg)  BMI 40.20 kg/m2  SpO2 99%  History of Present Illness Sean Allen is a 30 y.o. male with OSA.  He has been doing well with his CPAP.  He has full face mask.  He had trouble with mouth dryness >> checked with DME and they adjusted his humidifier.  He has started working with Physiological scientist, and has changed his diet.  Physical Exam  General - No distress ENT - No sinus tenderness, no oral exudate, no LAN Cardiac - s1s2 regular, no murmur Chest - No wheeze/rales/dullness Back - No focal tenderness Abd - Soft, non-tender Ext - No edema Neuro - Normal strength Skin  - No rashes Psych - normal mood, and behavior   Assessment/Plan  Obstructive sleep apnea. He is compliant with therapy and reports benefit. Plan: - continue auto CPAP  Obesity. Plan: - encouraged him to continue with his weight loss regimen   Patient Instructions  Follow up in 1 year     Chesley Mires, MD [place] Pager:  [phone]

## 2015-05-02 ENCOUNTER — Ambulatory Visit (HOSPITAL_COMMUNITY)
Admission: RE | Admit: 2015-05-02 | Discharge: 2015-05-02 | Disposition: A | Discharge status: Home / Self Care | Payer: 59 | Source: Ambulatory Visit | Attending: Cardiovascular Disease | Admitting: Cardiovascular Disease

## 2015-05-02 ENCOUNTER — Encounter (HOSPITAL_COMMUNITY): Payer: Self-pay | Admitting: *Deleted

## 2015-05-02 DIAGNOSIS — J301 Allergic rhinitis due to pollen: Secondary | ICD-10-CM | POA: Diagnosis not present

## 2015-05-02 DIAGNOSIS — J3081 Allergic rhinitis due to animal (cat) (dog) hair and dander: Secondary | ICD-10-CM | POA: Diagnosis not present

## 2015-05-02 DIAGNOSIS — R9439 Abnormal result of other cardiovascular function study: Secondary | ICD-10-CM | POA: Diagnosis not present

## 2015-05-02 DIAGNOSIS — R0789 Other chest pain: Secondary | ICD-10-CM | POA: Diagnosis not present

## 2015-05-02 DIAGNOSIS — J3089 Other allergic rhinitis: Secondary | ICD-10-CM | POA: Diagnosis not present

## 2015-05-02 LAB — EXERCISE TOLERANCE TEST
CHL CUP MPHR: 193 {beats}/min
CHL CUP STRESS STAGE 1 DBP: 85 mmHg
CHL CUP STRESS STAGE 1 SPEED: 0 mph
CHL CUP STRESS STAGE 2 GRADE: 0 %
CHL CUP STRESS STAGE 2 HR: 93 {beats}/min
CHL CUP STRESS STAGE 3 GRADE: 0 %
CHL CUP STRESS STAGE 3 HR: 95 {beats}/min
CHL CUP STRESS STAGE 4 SBP: 152 mmHg
CHL CUP STRESS STAGE 5 DBP: 72 mmHg
CHL CUP STRESS STAGE 5 GRADE: 12 %
CHL CUP STRESS STAGE 6 HR: 196 {beats}/min
CHL CUP STRESS STAGE 7 DBP: 64 mmHg
CHL CUP STRESS STAGE 7 SBP: 188 mmHg
CHL CUP STRESS STAGE 7 SPEED: 0 mph
CHL CUP STRESS STAGE 8 SBP: 150 mmHg
CHL CUP STRESS STAGE 8 SPEED: 0 mph
CHL RATE OF PERCEIVED EXERTION: 16
CSEPED: 8 min
CSEPEW: 10.1 METS
CSEPHR: 101 %
Peak BP: 190 mmHg
Peak HR: 196 {beats}/min
Percent of predicted max HR: 101 %
Rest HR: 99 {beats}/min
Stage 1 Grade: 0 %
Stage 1 HR: 96 {beats}/min
Stage 1 SBP: 133 mmHg
Stage 2 Speed: 0.8 mph
Stage 3 Speed: 1 mph
Stage 4 DBP: 64 mmHg
Stage 4 Grade: 10 %
Stage 4 HR: 148 {beats}/min
Stage 4 Speed: 1.7 mph
Stage 5 HR: 181 {beats}/min
Stage 5 SBP: 193 mmHg
Stage 5 Speed: 2.5 mph
Stage 6 DBP: 70 mmHg
Stage 6 Grade: 14 %
Stage 6 SBP: 190 mmHg
Stage 6 Speed: 3.4 mph
Stage 7 Grade: 0 %
Stage 7 HR: 171 {beats}/min
Stage 8 DBP: 57 mmHg
Stage 8 Grade: 0 %
Stage 8 HR: 123 {beats}/min

## 2015-05-02 NOTE — Progress Notes (Unsigned)
Patient ID: Sean Allen, male   DOB: 1985-12-09, 30 y.o.   MRN: [id number] Patient had NSST changes, Dr. Gwenlyn Found reviewed ok to d/c home.

## 2015-05-03 ENCOUNTER — Other Ambulatory Visit: Payer: Self-pay | Admitting: Family Medicine

## 2015-05-03 DIAGNOSIS — R9439 Abnormal result of other cardiovascular function study: Secondary | ICD-10-CM

## 2015-05-04 MED FILL — LEVOCETIRIZINE 5 MG TABLET: 5 | 60 days supply | Qty: 60 | Fill #1

## 2015-05-08 ENCOUNTER — Ambulatory Visit (INDEPENDENT_AMBULATORY_CARE_PROVIDER_SITE_OTHER): Payer: 59 | Admitting: Cardiovascular Disease

## 2015-05-08 ENCOUNTER — Encounter: Payer: Self-pay | Admitting: Cardiovascular Disease

## 2015-05-08 VITALS — BP 138/88 | HR 89 | Ht 74.0 in | Wt 319.0 lb

## 2015-05-08 DIAGNOSIS — R9439 Abnormal result of other cardiovascular function study: Secondary | ICD-10-CM | POA: Diagnosis not present

## 2015-05-08 DIAGNOSIS — R0789 Other chest pain: Secondary | ICD-10-CM | POA: Diagnosis not present

## 2015-05-08 NOTE — Patient Instructions (Signed)
Continue all current medications. Follow up as needed  

## 2015-05-08 NOTE — Progress Notes (Signed)
Patient ID: Sean Allen, male   DOB: 1985-11-19, 30 y.o.   MRN: [id number]       CARDIOLOGY CONSULT NOTE  Patient ID: Sean Allen MRN: [id number] DOB/AGE: April 05, 1986 30 y.o.  Admit date: (Not on file) Primary Physician Lucretia Kern., DO  Reason for Consultation: chest pain  HPI: The patient is a 30 year old male with a history of asthma, obesity, obstructive sleep apnea, and GERD (he reports due to postnasal drip) who experienced atypical chest pain. He had been doing some heavy yard work and developed a soreness in the left pectoral region and said he felt like he pulled a muscle. It was alleviated with NSAIDs and alternating heat and ice. He denies exertional chest pain. The pain was reportedly reproducible on palpation.  He underwent an exercise treadmill stress test on 05/02/15 and demonstrated fair exercise tolerance, exercising for 8 minutes with no chest pain and a normal hemodynamic response. He was noted to have 2 mm ST segment depressions in the inferior leads with exercise.  He has been working out with a trainer once a week and does both cardio and strength training and has no exertional symptoms whatsoever. He has no family history of premature coronary artery disease. He does not smoke and rarely drinks alcohol. He does not use drugs. He only has exertional dyspnea if he doesn't take his inhalers. He denies orthopnea, paroxysmal nocturnal dyspnea, and leg swelling. He uses CPAP for sleep apnea.     Allergies  Allergen Reactions  . Dust Mite Extract   . Mold Extract [Trichophyton]   . Other     Cats     Current Outpatient Prescriptions  Medication Sig Dispense Refill  . albuterol (PROAIR HFA) 108 (90 BASE) MCG/ACT inhaler Inhale 2 puffs into the lungs every 4 (four) hours as needed for wheezing or shortness of breath. 1 Inhaler 0  . budesonide-formoterol (SYMBICORT) 160-4.5 MCG/ACT inhaler Inhale 2 puffs into the lungs 2 (two) times daily.    Marland Kitchen levocetirizine  (XYZAL) 5 MG tablet Take 5 mg by mouth every evening.    . montelukast (SINGULAIR) 10 MG tablet Take 10 mg by mouth at bedtime.    . NON FORMULARY Allergy injections    . Olopatadine HCl (PATANASE NA) Place into the nose daily.    . pantoprazole (PROTONIX) 40 MG tablet Take 40 mg by mouth daily.     No current facility-administered medications for this visit.    Past Medical History  Diagnosis Date  . Asthma   . GERD (gastroesophageal reflux disease)   . Varicocele, evaluated by urologist in the past 08/30/2013  . Allergic rhinitis 08/30/2013  . Hx of retinal detachment - [facility] 08/30/2013  . OSA (obstructive sleep apnea) 02/14/2015    Past Surgical History  Procedure Laterality Date  . Tonsillectomy and adenoidectomy    . Eye surgery      bilateral retinal detachment  . Testicle surgery      Social History   Social History  . Marital Status: Single    Spouse Name: N/A  . Number of Children: N/A  . Years of Education: N/A   Occupational History  . Not on file.   Social History Main Topics  . Smoking status: Never Smoker   . Smokeless tobacco: Never Used  . Alcohol Use: 0.0 oz/week    0 Standard drinks or equivalent per week     Comment: occ 1-2 drinks  . Drug Use: Not on file  .  Sexual Activity: Not on file   Other Topics Concern  . Not on file   Social History Narrative   Work or School: Writer      Home Situation: lives alone      Spiritual Beliefs: Christian      Lifestyle: regular CV exercise; working on a healthy diet                 No family history of premature CAD in 1st degree relatives.  Prior to Admission medications   Medication Sig Start Date End Date Taking? Authorizing Provider  albuterol (PROAIR HFA) 108 (90 BASE) MCG/ACT inhaler Inhale 2 puffs into the lungs every 4 (four) hours as needed for wheezing or shortness of breath. 12/07/13   Lucretia Kern, DO  budesonide-formoterol (SYMBICORT) 160-4.5 MCG/ACT  inhaler Inhale 2 puffs into the lungs 2 (two) times daily.    Historical Provider, MD  levocetirizine (XYZAL) 5 MG tablet Take 5 mg by mouth every evening.    Historical Provider, MD  montelukast (SINGULAIR) 10 MG tablet Take 10 mg by mouth at bedtime.    Historical Provider, MD  NON FORMULARY Allergy injections    Historical Provider, MD  Olopatadine HCl (PATANASE NA) Place into the nose daily.    Historical Provider, MD  pantoprazole (PROTONIX) 40 MG tablet Take 40 mg by mouth daily.    Historical Provider, MD     Review of systems complete and found to be negative unless listed above in HPI     Physical exam Blood pressure 138/88, pulse 89, height 6\' 2"  (1.88 m), weight 319 lb (144.697 kg), SpO2 98 %. General: NAD Neck: No JVD, no thyromegaly or thyroid nodule.  Lungs: Diminished at bases, no rales or wheezes. CV: Nondisplaced PMI. No chest wall tenderness. Regular rate and rhythm, normal S1/S2, no S3/S4, no murmur.  No peripheral edema.  No carotid bruit.  Normal pedal pulses.  Abdomen: Soft, obese. Skin: Intact without lesions or rashes.  Neurologic: Alert and oriented x 3.  Psych: Normal affect. Extremities: No clubbing or cyanosis.  HEENT: Normal.   ECG: Most recent ECG reviewed.  Labs:  No results found for: WBC, HGB, HCT, MCV, PLT No results for input(s): NA, K, CL, CO2, BUN, CREATININE, CALCIUM, PROT, BILITOT, ALKPHOS, ALT, AST, GLUCOSE in the last 168 hours.  Invalid input(s): LABALBU No results found for: CKTOTAL, CKMB, CKMBINDEX, TROPONINI  Lab Results  Component Value Date   CHOL 168 12/06/2014   CHOL 170 08/30/2013   Lab Results  Component Value Date   HDL 45.70 12/06/2014   HDL 38.40* 08/30/2013   Lab Results  Component Value Date   LDLCALC 105* 12/06/2014   LDLCALC 109* 08/30/2013   Lab Results  Component Value Date   TRIG 87.0 12/06/2014   TRIG 113.0 08/30/2013   Lab Results  Component Value Date   CHOLHDL 4 12/06/2014   CHOLHDL 4 08/30/2013     No results found for: LDLDIRECT       Studies: No results found.  ASSESSMENT AND PLAN:  1. Chest pain with abnormal stress test: Symptoms were atypical and indeed seem consistent with musculoskeletal injury. While ECG findings are indicative of ischemia, he has no exertional symptoms whatsoever and lacks multiple, significant risk factors for ischemic heart disease. Given his age, I feel this represents a false positive stress test and no further testing is indicated. Interestingly, his father had a false positive stress test at nearly the same age.  Dispo: f/u  prn.   Signed: Kate Sable, M.D., F.A.C.C.  05/08/2015, 9:39 AM

## 2015-05-09 DIAGNOSIS — J3081 Allergic rhinitis due to animal (cat) (dog) hair and dander: Secondary | ICD-10-CM | POA: Diagnosis not present

## 2015-05-09 DIAGNOSIS — J3089 Other allergic rhinitis: Secondary | ICD-10-CM | POA: Diagnosis not present

## 2015-05-16 DIAGNOSIS — J3089 Other allergic rhinitis: Secondary | ICD-10-CM | POA: Diagnosis not present

## 2015-05-16 DIAGNOSIS — J3081 Allergic rhinitis due to animal (cat) (dog) hair and dander: Secondary | ICD-10-CM | POA: Diagnosis not present

## 2015-05-23 DIAGNOSIS — J3089 Other allergic rhinitis: Secondary | ICD-10-CM | POA: Diagnosis not present

## 2015-05-23 DIAGNOSIS — G4733 Obstructive sleep apnea (adult) (pediatric): Secondary | ICD-10-CM | POA: Diagnosis not present

## 2015-05-23 DIAGNOSIS — J3081 Allergic rhinitis due to animal (cat) (dog) hair and dander: Secondary | ICD-10-CM | POA: Diagnosis not present

## 2015-05-23 DIAGNOSIS — J301 Allergic rhinitis due to pollen: Secondary | ICD-10-CM | POA: Diagnosis not present

## 2015-05-29 MED FILL — MONTELUKAST SOD 10 MG TAB: 10 | 30 days supply | Qty: 30 | Fill #0

## 2015-05-30 DIAGNOSIS — J3081 Allergic rhinitis due to animal (cat) (dog) hair and dander: Secondary | ICD-10-CM | POA: Diagnosis not present

## 2015-05-30 DIAGNOSIS — J3089 Other allergic rhinitis: Secondary | ICD-10-CM | POA: Diagnosis not present

## 2015-06-06 DIAGNOSIS — J3089 Other allergic rhinitis: Secondary | ICD-10-CM | POA: Diagnosis not present

## 2015-06-06 DIAGNOSIS — J3081 Allergic rhinitis due to animal (cat) (dog) hair and dander: Secondary | ICD-10-CM | POA: Diagnosis not present

## 2015-06-12 ENCOUNTER — Encounter: Payer: Self-pay | Admitting: Cardiovascular Disease

## 2015-06-12 ENCOUNTER — Ambulatory Visit (INDEPENDENT_AMBULATORY_CARE_PROVIDER_SITE_OTHER): Payer: 59 | Admitting: Cardiovascular Disease

## 2015-06-12 VITALS — BP 136/82 | HR 86 | Ht 75.0 in | Wt 320.0 lb

## 2015-06-12 DIAGNOSIS — R0789 Other chest pain: Secondary | ICD-10-CM

## 2015-06-12 DIAGNOSIS — R9439 Abnormal result of other cardiovascular function study: Secondary | ICD-10-CM | POA: Diagnosis not present

## 2015-06-12 DIAGNOSIS — R002 Palpitations: Secondary | ICD-10-CM

## 2015-06-12 NOTE — Patient Instructions (Signed)
Continue all current medications. Follow up as needed  

## 2015-06-12 NOTE — Progress Notes (Signed)
Patient ID: Sean Allen, male   DOB: 29-Oct-1985, 30 y.o.   MRN: [id number]      SUBJECTIVE: The patient returns because he had some additional questions prior to embarking on a trip to [place] this summer. He continues to work out with a Physiological scientist. He occasionally has some chest muscle soreness but this quickly resolves with rest. This usually occurs after lifting weights. He had palpitations very briefly and only on one occasion since his last visit with me and wonders if it was due to anxiety.   Review of Systems: As per "subjective", otherwise negative.  Allergies  Allergen Reactions  . Dust Mite Extract   . Mold Extract [Trichophyton]   . Other     Cats     Current Outpatient Prescriptions  Medication Sig Dispense Refill  . albuterol (PROAIR HFA) 108 (90 BASE) MCG/ACT inhaler Inhale 2 puffs into the lungs every 4 (four) hours as needed for wheezing or shortness of breath. 1 Inhaler 0  . budesonide-formoterol (SYMBICORT) 160-4.5 MCG/ACT inhaler Inhale 2 puffs into the lungs 2 (two) times daily.    Marland Kitchen levocetirizine (XYZAL) 5 MG tablet Take 5 mg by mouth every evening.    . montelukast (SINGULAIR) 10 MG tablet Take 10 mg by mouth at bedtime.    . NON FORMULARY Allergy injections    . Olopatadine HCl (PATANASE NA) Place into the nose daily.    . pantoprazole (PROTONIX) 40 MG tablet Take 40 mg by mouth daily.     No current facility-administered medications for this visit.    Past Medical History  Diagnosis Date  . Asthma   . GERD (gastroesophageal reflux disease)   . Varicocele, evaluated by urologist in the past 08/30/2013  . Allergic rhinitis 08/30/2013  . Hx of retinal detachment - [facility] 08/30/2013  . OSA (obstructive sleep apnea) 02/14/2015    Past Surgical History  Procedure Laterality Date  . Tonsillectomy and adenoidectomy    . Eye surgery      bilateral retinal detachment  . Testicle surgery      Social History   Social History  .  Marital Status: Single    Spouse Name: N/A  . Number of Children: N/A  . Years of Education: N/A   Occupational History  . Not on file.   Social History Main Topics  . Smoking status: Never Smoker   . Smokeless tobacco: Never Used  . Alcohol Use: 0.0 oz/week    0 Standard drinks or equivalent per week     Comment: occ 1-2 drinks  . Drug Use: Not on file  . Sexual Activity: Not on file   Other Topics Concern  . Not on file   Social History Narrative   Work or School: Writer      Home Situation: lives alone      Spiritual Beliefs: Christian      Lifestyle: regular CV exercise; working on a healthy diet                 Filed Vitals:   06/12/15 1603  BP: 136/82  Pulse: 86  Height: 6\' 3"  (1.905 m)  Weight: 320 lb (145.151 kg)  SpO2: 98%    PHYSICAL EXAM General: NAD Neck: No JVD, no thyromegaly or thyroid nodule.  Lungs: Diminished at bases, no rales or wheezes. CV: Nondisplaced PMI. No chest wall tenderness. Regular rate and rhythm, normal S1/S2, no S3/S4, no murmur. No peripheral edema.  Abdomen: Soft, obese. Skin: Intact  without lesions or rashes.  Neurologic: Alert and oriented x 3.  Psych: Normal affect. Extremities: No clubbing or cyanosis.  HEENT: Normal.   ECG: Most recent ECG reviewed.      ASSESSMENT AND PLAN: 1. Chest pain with abnormal stress test: Symptoms were atypical and indeed seem consistent with musculoskeletal injury. While ECG findings are indicative of ischemia, he has no exertional symptoms whatsoever and lacks multiple, significant risk factors for ischemic heart disease. Given his age, I feel this represents a false positive stress test and no further testing is indicated. Interestingly, his father had a false positive stress test at nearly the same age.  2. Palpitations: Only occurred one occasion and this too very briefly. If there were frequent occurrences, I would consider Holter monitoring.  Dispo: f/u  prn.    Kate Sable, M.D., F.A.C.C.

## 2015-06-13 DIAGNOSIS — J3089 Other allergic rhinitis: Secondary | ICD-10-CM | POA: Diagnosis not present

## 2015-06-13 DIAGNOSIS — J3081 Allergic rhinitis due to animal (cat) (dog) hair and dander: Secondary | ICD-10-CM | POA: Diagnosis not present

## 2015-06-20 DIAGNOSIS — J301 Allergic rhinitis due to pollen: Secondary | ICD-10-CM | POA: Diagnosis not present

## 2015-06-20 DIAGNOSIS — J3081 Allergic rhinitis due to animal (cat) (dog) hair and dander: Secondary | ICD-10-CM | POA: Diagnosis not present

## 2015-06-20 DIAGNOSIS — J3089 Other allergic rhinitis: Secondary | ICD-10-CM | POA: Diagnosis not present

## 2015-06-20 DIAGNOSIS — G4733 Obstructive sleep apnea (adult) (pediatric): Secondary | ICD-10-CM | POA: Diagnosis not present

## 2015-06-23 DIAGNOSIS — G4733 Obstructive sleep apnea (adult) (pediatric): Secondary | ICD-10-CM | POA: Diagnosis not present

## 2015-06-27 DIAGNOSIS — J3089 Other allergic rhinitis: Secondary | ICD-10-CM | POA: Diagnosis not present

## 2015-06-27 DIAGNOSIS — J3081 Allergic rhinitis due to animal (cat) (dog) hair and dander: Secondary | ICD-10-CM | POA: Diagnosis not present

## 2015-07-04 ENCOUNTER — Telehealth: Payer: 59 | Admitting: Family

## 2015-07-04 DIAGNOSIS — J3081 Allergic rhinitis due to animal (cat) (dog) hair and dander: Secondary | ICD-10-CM | POA: Diagnosis not present

## 2015-07-04 DIAGNOSIS — J4530 Mild persistent asthma, uncomplicated: Secondary | ICD-10-CM | POA: Diagnosis not present

## 2015-07-04 DIAGNOSIS — N489 Disorder of penis, unspecified: Secondary | ICD-10-CM

## 2015-07-04 DIAGNOSIS — K219 Gastro-esophageal reflux disease without esophagitis: Secondary | ICD-10-CM | POA: Diagnosis not present

## 2015-07-04 DIAGNOSIS — J3089 Other allergic rhinitis: Secondary | ICD-10-CM | POA: Diagnosis not present

## 2015-07-04 DIAGNOSIS — J301 Allergic rhinitis due to pollen: Secondary | ICD-10-CM | POA: Diagnosis not present

## 2015-07-04 MED FILL — MONTELUKAST SOD 10 MG TAB: 10 | 90 days supply | Qty: 90 | Fill #0

## 2015-07-04 MED FILL — LEVOCETIRIZINE 5 MG TABLET: 5 | 90 days supply | Qty: 90 | Fill #0

## 2015-07-04 NOTE — Progress Notes (Signed)
Based on what you shared with me it looks like you have a serious condition that should be evaluated in a face to face office visit.  If you are having a true medical emergency please call 911.  If you need an urgent face to face visit, [facility] has four urgent care centers for your convenience.  . [facility]  [phone] Get Driving Directions Find a Provider at this Location  [address] [place], [place] [address] . 8 am to 8 pm Monday-Friday . 9 am to 7 pm Saturday-Sunday  . [facility]  [phone] Get Driving Directions Find a Provider at this Location  [address], [address] [place], [place] [address] . 8 am to 8 pm Monday-Friday . 9 am to 6 pm Saturday . 11 am to 6 pm Sunday   .  [facility]  [phone] Get Driving Directions  [address] [place], [place] [address] . 8 am to 8 pm Monday-Friday . 9 am to 4 pm Saturday-Sunday   . [facility] (a walk in primary care provider)  [phone]  Get Driving Directions Find a Provider at this Location  [address] [place], [place] [address] . 8 am to 8:30 pm Monday-Thursday . 8 am to 6 pm Friday . 8 am to 4 pm Saturday-Sunday   Your e-visit answers were reviewed by a board certified advanced clinical practitioner to complete your personal care plan.  Thank you for using e-Visits. 

## 2015-07-05 ENCOUNTER — Ambulatory Visit (INDEPENDENT_AMBULATORY_CARE_PROVIDER_SITE_OTHER): Payer: 59 | Admitting: Internal Medicine

## 2015-07-05 VITALS — BP 136/80 | HR 79 | Temp 98.4°F | Resp 20 | Wt 322.0 lb

## 2015-07-05 DIAGNOSIS — R300 Dysuria: Secondary | ICD-10-CM

## 2015-07-05 LAB — POCT URINALYSIS DIPSTICK
BILIRUBIN UA: NEGATIVE
GLUCOSE UA: NEGATIVE
Ketones, UA: NEGATIVE
LEUKOCYTES UA: NEGATIVE
NITRITE UA: NEGATIVE
Protein, UA: NEGATIVE
RBC UA: NEGATIVE
Spec Grav, UA: 1.02
Urobilinogen, UA: NEGATIVE
pH, UA: 6

## 2015-07-05 MED ORDER — CIPROFLOXACIN HCL 500 MG PO TABS: 500.0000 mg | ORAL_TABLET | Freq: Two times a day (BID) | ORAL | Status: DC

## 2015-07-05 NOTE — Progress Notes (Signed)
Subjective:    Patient ID: Sean Allen, male    DOB: 03-30-1986, 30 y.o.   MRN: [id number]  HPI  Here after chopping wood in the woods 4 days ago, had a ? Insect bite to the scrotum, felt the bite, never saw the insect, and today with dysuria with mostly end-urination discomfort and urgency, helped somewhat with cranberry, but back today.  Denies urinary symptoms such as urgency, flank pain, hematuria or n/v, fever, chills. Reading on the internet, he thinks possible uti. No unprotected intercourse x 6 mo.  No prior hx. HIV neg aug 2016.  Pt denies fever, wt loss, night sweats, loss of appetite, or other constitutional symptoms Past Medical History  Diagnosis Date  . Asthma   . GERD (gastroesophageal reflux disease)   . Varicocele, evaluated by urologist in the past 08/30/2013  . Allergic rhinitis 08/30/2013  . Hx of retinal detachment - [facility] 08/30/2013  . OSA (obstructive sleep apnea) 02/14/2015   Past Surgical History  Procedure Laterality Date  . Tonsillectomy and adenoidectomy    . Eye surgery      bilateral retinal detachment  . Testicle surgery      reports that he has never smoked. He has never used smokeless tobacco. He reports that he drinks alcohol. His drug history is not on file. family history includes Heart attack in his paternal grandfather and paternal grandmother; Heart disease in his maternal grandfather, maternal grandmother, paternal grandfather, and paternal grandmother; Hyperlipidemia in his father; Hypertension in his paternal grandmother. Allergies  Allergen Reactions  . Dust Mite Extract   . Mold Extract [Trichophyton]   . Other     Cats    Current Outpatient Prescriptions on File Prior to Visit  Medication Sig Dispense Refill  . albuterol (PROAIR HFA) 108 (90 BASE) MCG/ACT inhaler Inhale 2 puffs into the lungs every 4 (four) hours as needed for wheezing or shortness of breath. 1 Inhaler 0  . budesonide-formoterol (SYMBICORT) 160-4.5  MCG/ACT inhaler Inhale 2 puffs into the lungs 2 (two) times daily.    Marland Kitchen levocetirizine (XYZAL) 5 MG tablet Take 5 mg by mouth every evening.    . montelukast (SINGULAIR) 10 MG tablet Take 10 mg by mouth at bedtime.    . NON FORMULARY Allergy injections    . Olopatadine HCl (PATANASE NA) Place into the nose daily.    . pantoprazole (PROTONIX) 40 MG tablet Take 40 mg by mouth daily.     No current facility-administered medications on file prior to visit.     Review of Systems  All otherwise neg per pt      Objective:   Physical Exam BP 136/80 mmHg  Pulse 79  Temp(Src) 98.4 F (36.9 C) (Oral)  Resp 20  Wt 322 lb (146.058 kg)  SpO2 97% VS noted, obese Constitutional: Pt appears in no significant distress HENT: Head: NCAT.  Right Ear: External ear normal.  Left Ear: External ear normal.  Eyes: . Pupils are equal, round, and reactive to light. Conjunctivae and EOM are normal Neck: Normal range of motion. Neck supple.  Cardiovascular: Normal rate and regular rhythm.   Pulmonary/Chest: Effort normal and breath sounds without rales or wheezing.  Abd:  Soft, ND, + BS with mild low mid abd tender GU: normal male penis without d/c, ulcer, rash, Has varicocele nontender left epididymal area, testicles normal size, no mass, no tender Neurological: Pt is alert. Not confused , motor grossly intact Skin: Skin is warm. No rash, no  LE edema Psychiatric: Pt behavior is normal. No agitation. mild nervous  POCT Urinalysis Dipstick  Status: Finalresult Visible to patient:  Not Released Dx:  Dysuria   Normal         Ref Range 5:40 PM    Color, UA  yellow   Clarity, UA  cloudy   Glucose, UA  negative   Bilirubin, UA  negative   Ketones, UA  negative   Spec Grav, UA  1.020   Blood, UA  negative   pH, UA  6.0   Protein, UA  negative   Urobilinogen, UA  negative   Nitrite, UA  negative   Leukocytes, UA Negative  Negative            Assessment & Plan:

## 2015-07-05 NOTE — Patient Instructions (Signed)
Please take all new medication as prescribed - the antibiotic; I sent this to the [facility] outpatient pharmacy, but they are now closed  I also gave a written prescription that can be filled at any other pharmacy tonight  Please continue all other medications as before, and refills have been done if requested.  Please have the pharmacy call with any other refills you may need.  Please keep your appointments with your specialists as you may have planned

## 2015-07-05 NOTE — Progress Notes (Signed)
Pre visit review using our clinic review tool, if applicable. No additional management support is needed unless otherwise documented below in the visit note. 

## 2015-07-06 NOTE — Assessment & Plan Note (Signed)
Udip reviewed, but with symptoms and mild tender, will tx empirically pending urine cx,  to f/u any worsening symptoms or concerns

## 2015-07-11 DIAGNOSIS — J301 Allergic rhinitis due to pollen: Secondary | ICD-10-CM | POA: Diagnosis not present

## 2015-07-11 DIAGNOSIS — J3089 Other allergic rhinitis: Secondary | ICD-10-CM | POA: Diagnosis not present

## 2015-07-18 DIAGNOSIS — J3089 Other allergic rhinitis: Secondary | ICD-10-CM | POA: Diagnosis not present

## 2015-07-18 DIAGNOSIS — J3081 Allergic rhinitis due to animal (cat) (dog) hair and dander: Secondary | ICD-10-CM | POA: Diagnosis not present

## 2015-07-21 DIAGNOSIS — G4733 Obstructive sleep apnea (adult) (pediatric): Secondary | ICD-10-CM | POA: Diagnosis not present

## 2015-07-25 DIAGNOSIS — J3089 Other allergic rhinitis: Secondary | ICD-10-CM | POA: Diagnosis not present

## 2015-07-25 DIAGNOSIS — J301 Allergic rhinitis due to pollen: Secondary | ICD-10-CM | POA: Diagnosis not present

## 2015-07-25 DIAGNOSIS — J3081 Allergic rhinitis due to animal (cat) (dog) hair and dander: Secondary | ICD-10-CM | POA: Diagnosis not present

## 2015-08-01 DIAGNOSIS — J3089 Other allergic rhinitis: Secondary | ICD-10-CM | POA: Diagnosis not present

## 2015-08-01 DIAGNOSIS — J3081 Allergic rhinitis due to animal (cat) (dog) hair and dander: Secondary | ICD-10-CM | POA: Diagnosis not present

## 2015-08-08 DIAGNOSIS — J3081 Allergic rhinitis due to animal (cat) (dog) hair and dander: Secondary | ICD-10-CM | POA: Diagnosis not present

## 2015-08-08 DIAGNOSIS — J3089 Other allergic rhinitis: Secondary | ICD-10-CM | POA: Diagnosis not present

## 2015-08-08 MED FILL — EPINEPHRINE 0.3 MG AUTO-INJ: 0.3 | 30 days supply | Qty: 2 | Fill #0

## 2015-08-08 MED FILL — OLOPATADINE 665 MCG NASAL S: 0.6 | 30 days supply | Qty: 31 | Fill #0

## 2015-08-09 DIAGNOSIS — J3089 Other allergic rhinitis: Secondary | ICD-10-CM | POA: Diagnosis not present

## 2015-08-09 DIAGNOSIS — J3081 Allergic rhinitis due to animal (cat) (dog) hair and dander: Secondary | ICD-10-CM | POA: Diagnosis not present

## 2015-08-10 ENCOUNTER — Telehealth: Payer: 59 | Admitting: Physician Assistant

## 2015-08-10 DIAGNOSIS — R399 Unspecified symptoms and signs involving the genitourinary system: Secondary | ICD-10-CM

## 2015-08-10 NOTE — Progress Notes (Signed)
We are sorry that you are not feeling well.  Here is how we plan to help!  Male bladder infections are not very common.  We worry about prostate or kidney conditions.  The standard of care is to examine the abdomen and kidneys, and to do a urine and blood test to make sure that something more serious is not going on.  We recommend that you see a provider today.  If your doctor's office is closed [facility] has the following Urgent Cares:   . [facility]  [phone] Get Driving Directions Find a Provider at this Location  [address] [place], [place] [address] . 8 am to 8 pm Monday-Friday . 9 am to 7 pm Saturday-Sunday  . [facility]  [phone] Get Driving Directions Find a Provider at this Location  [address], [address] [place], [place] [address] . 8 am to 8 pm Monday-Friday . 9 am to 6 pm Saturday . 11 am to 6 pm Sunday  .  [facility] [place]  [phone] Get Driving Directions  [address] [place], [place] [address] . 8 am to 8 pm Monday-Friday . 9 am to 4 pm Saturday-Sunday  . [facility] (a walk in primary care provider)  [phone]  Get Driving Directions Find a Provider at this Location  [address] [place], [place] [address] . 8 am to 8:30 pm Monday-Thursday . 8 am to 6 pm Friday . 8 am to 4 pm Saturday-Sunday  Your e-visit answers were reviewed by a board certified advanced clinical practitioner to complete your personal care plan.  Depending on the condition, your plan could have included both over the counter or prescription medications.  You will get an e-mail in the next two days asking about your experience.  I hope that your e-visit has been valuable and will speed your recovery. Thank you for using e-visits.    

## 2015-08-11 ENCOUNTER — Encounter: Payer: Self-pay | Admitting: Family Medicine

## 2015-08-11 ENCOUNTER — Telehealth: Payer: Self-pay | Admitting: *Deleted

## 2015-08-11 ENCOUNTER — Ambulatory Visit (INDEPENDENT_AMBULATORY_CARE_PROVIDER_SITE_OTHER): Payer: 59 | Admitting: Family Medicine

## 2015-08-11 ENCOUNTER — Other Ambulatory Visit (HOSPITAL_COMMUNITY)
Admission: RE | Admit: 2015-08-11 | Discharge: 2015-08-11 | Disposition: A | Discharge status: Home / Self Care | Payer: 59 | Source: Ambulatory Visit | Attending: Family Medicine | Admitting: Family Medicine

## 2015-08-11 VITALS — BP 132/84 | HR 79 | Temp 98.1°F | Ht 75.0 in | Wt 317.1 lb

## 2015-08-11 DIAGNOSIS — R300 Dysuria: Secondary | ICD-10-CM | POA: Diagnosis not present

## 2015-08-11 DIAGNOSIS — Z113 Encounter for screening for infections with a predominantly sexual mode of transmission: Secondary | ICD-10-CM | POA: Diagnosis not present

## 2015-08-11 DIAGNOSIS — N342 Other urethritis: Secondary | ICD-10-CM

## 2015-08-11 LAB — POCT URINALYSIS DIPSTICK
Bilirubin, UA: NEGATIVE
Blood, UA: NEGATIVE
GLUCOSE UA: NEGATIVE
Ketones, UA: NEGATIVE
Leukocytes, UA: NEGATIVE
NITRITE UA: POSITIVE
PROTEIN UA: NEGATIVE
UROBILINOGEN UA: 0.2
pH, UA: 6

## 2015-08-11 MED ORDER — DOXYCYCLINE HYCLATE 100 MG PO CAPS: 100.0000 mg | ORAL_CAPSULE | Freq: Two times a day (BID) | ORAL | Status: DC

## 2015-08-11 MED ORDER — PANTOPRAZOLE SODIUM 20 MG PO TBEC: 20.0000 mg | DELAYED_RELEASE_TABLET | Freq: Every day | ORAL | Status: DC | PRN

## 2015-08-11 MED ORDER — CEFTRIAXONE SODIUM 1 G IJ SOLR
250.0000 mg | Freq: Once | INTRAMUSCULAR | Status: AC
  Administered 2015-08-11: 250 mg via INTRAMUSCULAR

## 2015-08-11 MED FILL — PANTOPRAZOLE SOD DR 20 MG T: 20 | 30 days supply | Qty: 30 | Fill #0

## 2015-08-11 MED FILL — DOXYCYCLINE HYCLATE 100 MG: 100 | 7 days supply | Qty: 14 | Fill #0

## 2015-08-11 NOTE — Telephone Encounter (Signed)
We recommend he take a lower dose of this. 20mg  sent for occasional use.

## 2015-08-11 NOTE — Telephone Encounter (Signed)
Patient forgot to ask Dr Maudie Mercury at his visit today for a refill on Protonix?  States this was given by Dr Donneta Romberg initially for PND and he needs to take this only every now and again?  Please call pt at his cell number.

## 2015-08-11 NOTE — Patient Instructions (Addendum)
BEFORE YOU LEAVE: -Rocephin  Take the antibiotic as instructed. Stay out of the sun.  Follow up if any persistent symptoms after treatment.  -We have ordered labs or studies at this visit. It can take up to 1-2 weeks for results and processing. We will contact you with instructions IF your results are abnormal. Normal results will be released to your [facility]. If you have not heard from [place] or can not find your results in [facility] in 2 weeks please contact our office.

## 2015-08-11 NOTE — Progress Notes (Signed)
Pre visit review using our clinic review tool, if applicable. No additional management support is needed unless otherwise documented below in the visit note. 

## 2015-08-11 NOTE — Progress Notes (Signed)
HPI: Acute visit for Dysuria: -several evisits and OV with other providers for this  -sees urologist for varicocele -symptoms started several weeks ago and include burning at penile tip, frequency, urgency - small amount of discharge in underwear yesterday -reports treated with several days of cipro and symptoms resolved for the most part but then returned -not concerned for STI, reports no sexual activity in 6 months, always used condoms and in monogamous relationship in the past -denies: rectal pain, fevers, malaise, weak stream, flank pain, hematuria -reports he does not want to take cipro again as upset his stomach  ROS: See pertinent positives and negatives per HPI.  Past Medical History  Diagnosis Date  . Asthma   . GERD (gastroesophageal reflux disease)   . Varicocele, evaluated by urologist in the past 08/30/2013  . Allergic rhinitis 08/30/2013  . Hx of retinal detachment - [facility] 08/30/2013  . OSA (obstructive sleep apnea) 02/14/2015    Past Surgical History  Procedure Laterality Date  . Tonsillectomy and adenoidectomy    . Eye surgery      bilateral retinal detachment  . Testicle surgery      Family History  Problem Relation Age of Onset  . Hyperlipidemia Father   . Hypertension Paternal Grandmother   . Heart disease Paternal Grandmother   . Heart attack Paternal Grandmother   . Heart disease Maternal Grandmother   . Heart disease Maternal Grandfather   . Heart disease Paternal Grandfather   . Heart attack Paternal Grandfather     Social History   Social History  . Marital Status: Single    Spouse Name: N/A  . Number of Children: N/A  . Years of Education: N/A   Social History Main Topics  . Smoking status: Never Smoker   . Smokeless tobacco: Never Used  . Alcohol Use: 0.0 oz/week    0 Standard drinks or equivalent per week     Comment: occ 1-2 drinks  . Drug Use: None  . Sexual Activity: Not Asked   Other Topics Concern  . 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  albuterol (PROAIR HFA) 108 (90 BASE) MCG/ACT inhaler, Inhale 2 puffs into the lungs every 4 (four) hours as needed for wheezing or shortness of breath., Disp: 1 Inhaler, Rfl: 0 .  budesonide-formoterol (SYMBICORT) 160-4.5 MCG/ACT inhaler, Inhale 2 puffs into the lungs 2 (two) times daily., Disp: , Rfl:  .  levocetirizine (XYZAL) 5 MG tablet, Take 5 mg by mouth every evening., Disp: , Rfl:  .  montelukast (SINGULAIR) 10 MG tablet, Take 10 mg by mouth at bedtime., Disp: , Rfl:  .  NON FORMULARY, Allergy injections, Disp: , Rfl:  .  Olopatadine HCl (PATANASE NA), Place into the nose daily., Disp: , Rfl:  .  pantoprazole (PROTONIX) 40 MG tablet, Take 40 mg by mouth daily., Disp: , Rfl:   EXAM:  Filed Vitals:   08/11/15 1029  BP: 132/84  Pulse: 79  Temp: 98.1 F (36.7 C)    Body mass index is 39.63 kg/(m^2).  GENERAL: vitals reviewed and listed above, alert, oriented, appears well hydrated and in no acute distress  HEENT: atraumatic,  conjunttiva clear, no obvious abnormalities on inspection of external nose and ears  NECK: no obvious masses on inspection  LUNGS: clear to auscultation bilaterally, no wheezes, rales or rhonchi, good air movement  CV: HRRR, no peripheral edema  GU: normal GU, normal DRE  ABD: BS+, soft, NTTP  MS: moves all extremities without noticeable abnormality  PSYCH: pleasant and cooperative, no obvious depression or anxiety  ASSESSMENT AND PLAN:  Discussed the following assessment and plan:  Dysuria - Plan: POC Urinalysis Dipstick  -query urethritis, partially treated -ucx, GC/Chlam, trich pending -rocephin IM today, home with doxy pedning cx results -urology referral if symptoms persist despite  tx -Patient advised to return or notify a doctor immediately if symptoms worsen or persist or new concerns arise.  There are no Patient Instructions on file for this visit.   Colin Benton R.

## 2015-08-11 NOTE — Addendum Note (Signed)
Addended by: Agnes Lawrence on: 08/11/2015 11:37 AM   Modules accepted: Orders

## 2015-08-11 NOTE — Telephone Encounter (Signed)
I called the pt and informed him of the message below. 

## 2015-08-13 LAB — URINE CULTURE
Colony Count: NO GROWTH
Organism ID, Bacteria: NO GROWTH

## 2015-08-14 LAB — URINE CYTOLOGY ANCILLARY ONLY
Chlamydia: NEGATIVE
Neisseria Gonorrhea: NEGATIVE
Trichomonas: NEGATIVE

## 2015-08-22 DIAGNOSIS — J3089 Other allergic rhinitis: Secondary | ICD-10-CM | POA: Diagnosis not present

## 2015-08-22 DIAGNOSIS — J3081 Allergic rhinitis due to animal (cat) (dog) hair and dander: Secondary | ICD-10-CM | POA: Diagnosis not present

## 2015-08-29 DIAGNOSIS — J3081 Allergic rhinitis due to animal (cat) (dog) hair and dander: Secondary | ICD-10-CM | POA: Diagnosis not present

## 2015-08-29 DIAGNOSIS — J3089 Other allergic rhinitis: Secondary | ICD-10-CM | POA: Diagnosis not present

## 2015-08-31 DIAGNOSIS — J3089 Other allergic rhinitis: Secondary | ICD-10-CM | POA: Diagnosis not present

## 2015-08-31 DIAGNOSIS — J3081 Allergic rhinitis due to animal (cat) (dog) hair and dander: Secondary | ICD-10-CM | POA: Diagnosis not present

## 2015-09-05 DIAGNOSIS — J301 Allergic rhinitis due to pollen: Secondary | ICD-10-CM | POA: Diagnosis not present

## 2015-09-05 DIAGNOSIS — J3081 Allergic rhinitis due to animal (cat) (dog) hair and dander: Secondary | ICD-10-CM | POA: Diagnosis not present

## 2015-09-05 DIAGNOSIS — J3089 Other allergic rhinitis: Secondary | ICD-10-CM | POA: Diagnosis not present

## 2015-09-07 DIAGNOSIS — J3081 Allergic rhinitis due to animal (cat) (dog) hair and dander: Secondary | ICD-10-CM | POA: Diagnosis not present

## 2015-09-07 DIAGNOSIS — J3089 Other allergic rhinitis: Secondary | ICD-10-CM | POA: Diagnosis not present

## 2015-09-09 ENCOUNTER — Telehealth: Payer: 59 | Admitting: Nurse Practitioner

## 2015-09-09 DIAGNOSIS — W57XXXA Bitten or stung by nonvenomous insect and other nonvenomous arthropods, initial encounter: Secondary | ICD-10-CM | POA: Diagnosis not present

## 2015-09-09 DIAGNOSIS — T148 Other injury of unspecified body region: Secondary | ICD-10-CM | POA: Diagnosis not present

## 2015-09-09 MED ORDER — DOXYCYCLINE HYCLATE 100 MG PO CAPS: 100.0000 mg | ORAL_CAPSULE | Freq: Two times a day (BID) | ORAL | Status: DC

## 2015-09-09 NOTE — Progress Notes (Signed)
Thank you for describing your tick bite, Here is how we plan to help! Based on the information that you shared with me it looks like you have A tick that bite that we will treat with a short course of doxycycline.  In most cases a tick bite is painless and does not itch.  Most tick bites in which the tick is quickly removed do not require prescriptions. Ticks can transmit several diseases if they are infected and remain attacked to your skin. Therefore the length that the tick was attached and any symptoms you have experienced after the bite are import to accurately develop your custom treatment plan. In most cases a single dose of doxycycline may prevent the development of a more serious condition.  Based on your information I have Provided a home care guide for tick bites  and  instructions on when to call for help. and I have sent a single dose of doxycycline to the pharmacy you selected. Please make sure that you selected a pharmacy that is open now.  Which ticks  are associated with illness?  The Wood Tick (dog tick) is the size of a watermelon seed and can sometimes transmit [place] spotted fever and [place] tick fever.   The Deer Tick (black-legged tick) is between the size of a poppy seed (pin head) and an apple seed, and can sometimes transmit Lyme disease.  A brown to black tick with a white splotch on its back is likely a male Amblyomma americanum ([place] tick). This tick has been associated with Southern Tick Associated illness ( STARI)  Lyme disease has become the most common tick-borne illness in the [place]. The risk of Lyme disease following a recognized deer tick bite is estimated to be 1%.  The majority of cases of Lyme disease start with a bull's eye rash at the site of the tick bite. The rash can occur days to weeks (typically 7-10 days) after a tick bite. Treatment with antibiotics is indicated if this rash appears. Flu-like symptoms may accompany the rash,  including: fever, chills, headaches, muscle aches, and fatigue. Removing ticks promptly may prevent tick borne disease.  What can be used to prevent Tick Bites?   Insect repellant with at leas 20% DEET.  Wearing long pants with sock and shoes.  Avoiding tall grass and heavily wooded areas.  Checking your skin after being outdoors.  Shower with a washcloth after outdoor exposures.  HOME CARE ADVICE FOR TICK BITE  1. Wood Tick Removal:  o Use a pair of tweezers and grasp the wood tick close to the skin (on its head). Pull the wood tick straight upward without twisting or crushing it. Maintain a steady pressure until it releases its grip.   o If tweezers aren't available, use fingers, a loop of thread around the jaws, or a needle between the jaws for traction.  o Note: covering the tick with petroleum jelly, nail polish or rubbing alcohol doesn't work. Neither does touching the tick with a hot or cold object. 2. Tiny Deer Tick Removal:   o Needs to be scraped off with a knife blade or credit card edge. o Place tick in a sealed container (e.g. glass jar, zip lock plastic bag), in case your doctor wants to see it. 3. Tick's Head Removal:  o If the wood tick's head breaks off in the skin, it must be removed. Clean the skin. Then use a sterile needle to uncover the head and lift it out   or scrape it off.  o If a very small piece of the head remains, the skin will eventually slough it off. 4. Antibiotic Ointment:  o Wash the wound and your hands with soap and water after removal to prevent catching any tick disease.  Apply an over the counter antibiotic ointment (e.g. bacitracin) to the bite once. 5. Expected Course: Tick bites normally don't itch or hurt. That's why they often go unnoticed. 6. Call Your Doctor If:  o You can't remove the tick or the tick's head o Fever, a severe head ache, or rash occur in the next 2 weeks o Bite begins to look infected o Lyme's disease is common in your  area o You have not had a tetanus in the last 10 years o Your current symptoms become worse    MAKE SURE YOU   Understand these instructions.  Will watch your condition.  Will get help right away if you are not doing well or get worse.   Thank you for choosing an e-visit.  Your e-visit answers were reviewed by a board certified advanced clinical practitioner to complete your personal care plan. Depending upon the condition, your plan could have included both over the counter or prescription medications. Please review your pharmacy choice. If there is a problem you may use MyChart messaging to have the prescription routed to another pharmacy. Your safety is important to us. If you have drug allergies check your prescription carefully.   You can use MyChart to ask questions about today's visit, request a non-urgent call back, or ask for a work or school excuse for 24 hours related to this e-Visit. If it has been greater than 24 hours you will need to follow up with your provider, or enter a new e-Visit to address those concerns.  You will get an email in the next two days asking about your experience. I hope  that your e-visit has been valuable and will speed your recovery 

## 2015-09-12 DIAGNOSIS — J3081 Allergic rhinitis due to animal (cat) (dog) hair and dander: Secondary | ICD-10-CM | POA: Diagnosis not present

## 2015-09-12 DIAGNOSIS — J3089 Other allergic rhinitis: Secondary | ICD-10-CM | POA: Diagnosis not present

## 2015-09-19 ENCOUNTER — Ambulatory Visit (INDEPENDENT_AMBULATORY_CARE_PROVIDER_SITE_OTHER): Payer: 59 | Admitting: Family Medicine

## 2015-09-19 ENCOUNTER — Telehealth: Payer: Self-pay | Admitting: Family Medicine

## 2015-09-19 DIAGNOSIS — J3081 Allergic rhinitis due to animal (cat) (dog) hair and dander: Secondary | ICD-10-CM | POA: Diagnosis not present

## 2015-09-19 DIAGNOSIS — R69 Illness, unspecified: Secondary | ICD-10-CM

## 2015-09-19 DIAGNOSIS — J3089 Other allergic rhinitis: Secondary | ICD-10-CM | POA: Diagnosis not present

## 2015-09-19 NOTE — Telephone Encounter (Signed)
Okay 

## 2015-09-19 NOTE — Progress Notes (Signed)
Late cancel

## 2015-09-19 NOTE — Telephone Encounter (Signed)
Patient had a 4-6 month follow up today that he got the appointment time mixed up for, so he wants to know if he can just wait until August to be seen at his next appointment.

## 2015-09-20 NOTE — Telephone Encounter (Signed)
Left a detailed message for pt making him aware that it was okay to wait until august.

## 2015-09-26 DIAGNOSIS — J3081 Allergic rhinitis due to animal (cat) (dog) hair and dander: Secondary | ICD-10-CM | POA: Diagnosis not present

## 2015-09-26 DIAGNOSIS — J301 Allergic rhinitis due to pollen: Secondary | ICD-10-CM | POA: Diagnosis not present

## 2015-09-28 DIAGNOSIS — G4733 Obstructive sleep apnea (adult) (pediatric): Secondary | ICD-10-CM | POA: Diagnosis not present

## 2015-10-03 DIAGNOSIS — J3089 Other allergic rhinitis: Secondary | ICD-10-CM | POA: Diagnosis not present

## 2015-10-03 DIAGNOSIS — J3081 Allergic rhinitis due to animal (cat) (dog) hair and dander: Secondary | ICD-10-CM | POA: Diagnosis not present

## 2015-10-12 DIAGNOSIS — J3081 Allergic rhinitis due to animal (cat) (dog) hair and dander: Secondary | ICD-10-CM | POA: Diagnosis not present

## 2015-10-12 DIAGNOSIS — J3089 Other allergic rhinitis: Secondary | ICD-10-CM | POA: Diagnosis not present

## 2015-10-19 DIAGNOSIS — J3081 Allergic rhinitis due to animal (cat) (dog) hair and dander: Secondary | ICD-10-CM | POA: Diagnosis not present

## 2015-10-19 DIAGNOSIS — J3089 Other allergic rhinitis: Secondary | ICD-10-CM | POA: Diagnosis not present

## 2015-10-20 MED FILL — MONTELUKAST SOD 10 MG TAB: 10 | 90 days supply | Qty: 90 | Fill #1

## 2015-10-20 MED FILL — LEVOCETIRIZINE 5 MG TABLET: 5 | 90 days supply | Qty: 90 | Fill #1

## 2015-10-24 DIAGNOSIS — J3081 Allergic rhinitis due to animal (cat) (dog) hair and dander: Secondary | ICD-10-CM | POA: Diagnosis not present

## 2015-10-24 DIAGNOSIS — J3089 Other allergic rhinitis: Secondary | ICD-10-CM | POA: Diagnosis not present

## 2015-10-31 DIAGNOSIS — J3089 Other allergic rhinitis: Secondary | ICD-10-CM | POA: Diagnosis not present

## 2015-10-31 DIAGNOSIS — J3081 Allergic rhinitis due to animal (cat) (dog) hair and dander: Secondary | ICD-10-CM | POA: Diagnosis not present

## 2015-11-14 DIAGNOSIS — J3089 Other allergic rhinitis: Secondary | ICD-10-CM | POA: Diagnosis not present

## 2015-11-14 DIAGNOSIS — J3081 Allergic rhinitis due to animal (cat) (dog) hair and dander: Secondary | ICD-10-CM | POA: Diagnosis not present

## 2015-11-16 ENCOUNTER — Telehealth: Payer: 59 | Admitting: Physician Assistant

## 2015-11-16 DIAGNOSIS — J309 Allergic rhinitis, unspecified: Secondary | ICD-10-CM

## 2015-11-16 NOTE — Progress Notes (Signed)
E visit for Allergic Rhinitis We are sorry that you are not feeling well.  Her is how we plan to help!  Based on what you have shared with me it looks like you have Allergic Rhinitis.  Rhinitis is when a reaction occurs that causes nasal congestion, runny nose, sneezing, and itching.  Most types of rhinitis are caused by an inflammation and are associated with symptoms in the eyes ears or throat.  There are several types of rhinitis.  The most common are acute rhinitis, which is usually caused by a viral illness, allergic or seasonal rhinitis, and nonallergic or year-round rhinitis.  Nasal allergies occur certain times of the year.  Allergic rhinitis is caused when allergens in the air trigger the release of histamine in the body.  Histamine causes itching, swelling, and fluid to build up in the fragile linings of the nasal passages, sinuses and eyelids.  An itchy nose and clear discharge are common.  I recommend the following over the counter treatments: You should take a daily dose of antihistamine like Xyzal 5 mg take 1 tablet daily. Over the counter Delsym can help with cough.  I also would recommend a nasal spray: Flonase 2 sprays into each nostril once daily  You may also benefit from eye drops such as: Visine 1-2 drops each eye twice daily as needed  HOME CARE:   You can use an over-the-counter saline nasal spray as needed  Avoid areas where there is heavy dust, mites, or molds  Stay indoors on windy days during the pollen season  Keep windows closed in home, at least in bedroom; use air conditioner.  Use high-efficiency house air filter  Keep windows closed in car, turn AC on re-circulate  Avoid playing out with dog during pollen season  GET HELP RIGHT AWAY IF:   If your symptoms do not improve within 10 days  You become short of breath  You develop yellow or green discharge from your nose for over 3 days  You have coughing fits  MAKE SURE YOU:   Understand  these instructions  Will watch your condition  Will get help right away if you are not doing well or get worse  Thank you for choosing an e-visit. Your e-visit answers were reviewed by a board certified advanced clinical practitioner to complete your personal care plan. Depending upon the condition, your plan could have included both over the counter or prescription medications. Please review your pharmacy choice. Be sure that the pharmacy you have chosen is open so that you can pick up your prescription now.  If there is a problem you may message your provider in [place] to have the prescription routed to another pharmacy. Your safety is important to [place]. If you have drug allergies check your prescription carefully.  For the next 24 hours, you can use MyChart to ask questions about today's visit, request a non-urgent call back, or ask for a work or school excuse from your e-visit provider. You will get an email in the next two days asking about your experience. I hope that your e-visit has been valuable and will speed your recovery.

## 2015-11-20 ENCOUNTER — Other Ambulatory Visit: Payer: Self-pay | Admitting: Family Medicine

## 2015-11-20 MED FILL — VENTOLIN HFA 90 MCG INHALER: 108 (90 BAS | 25 days supply | Qty: 18 | Fill #0

## 2015-11-20 MED FILL — PANTOPRAZOLE SOD DR 20 MG T: 20 | 30 days supply | Qty: 30 | Fill #0

## 2015-11-21 DIAGNOSIS — J3081 Allergic rhinitis due to animal (cat) (dog) hair and dander: Secondary | ICD-10-CM | POA: Diagnosis not present

## 2015-11-21 DIAGNOSIS — J3089 Other allergic rhinitis: Secondary | ICD-10-CM | POA: Diagnosis not present

## 2015-11-22 ENCOUNTER — Telehealth: Payer: 59 | Admitting: Family

## 2015-11-22 DIAGNOSIS — B354 Tinea corporis: Secondary | ICD-10-CM | POA: Diagnosis not present

## 2015-11-22 MED ORDER — NAFTIFINE HCL 1 % EX GEL: CUTANEOUS | 0 refills | Status: DC

## 2015-11-22 NOTE — Progress Notes (Signed)
E Visit for Rash  We are sorry that you are not feeling well. Here is how we plan to help!  It looks as if you may have ringworm. I have sent in naftifine topical cream that apply twice a day. Practice good hand hygiene.   HOME CARE:   Take cool showers and avoid direct sunlight.  Apply cool compress or wet dressings.  Take a bath in an oatmeal bath.  Sprinkle content of one Aveeno packet under running faucet with comfortably warm water.  Bathe for 15-20 minutes, 1-2 times daily.  Pat dry with a towel. Do not rub the rash.  Use hydrocortisone cream.  Take an antihistamine like Benadryl for widespread rashes that itch.  The adult dose of Benadryl is 25-50 mg by mouth 4 times daily.  Caution:  This type of medication may cause sleepiness.  Do not drink alcohol, drive, or operate dangerous machinery while taking antihistamines.  Do not take these medications if you have prostate enlargement.  Read package instructions thoroughly on all medications that you take.  GET HELP RIGHT AWAY IF:   Symptoms don't go away after treatment.  Severe itching that persists.  If you rash spreads or swells.  If you rash begins to smell.  If it blisters and opens or develops a yellow-brown crust.  You develop a fever.  You have a sore throat.  You become short of breath.  MAKE SURE YOU:  Understand these instructions. Will watch your condition. Will get help right away if you are not doing well or get worse.  Thank you for choosing an e-visit. Your e-visit answers were reviewed by a board certified advanced clinical practitioner to complete your personal care plan. Depending upon the condition, your plan could have included both over the counter or prescription medications. Please review your pharmacy choice. Be sure that the pharmacy you have chosen is open so that you can pick up your prescription now.  If there is a problem you may message your provider in [place] to have the  prescription routed to another pharmacy. Your safety is important to [place]. If you have drug allergies check your prescription carefully.  For the next 24 hours, you can use MyChart to ask questions about today's visit, request a non-urgent call back, or ask for a work or school excuse from your e-visit provider. You will get an email in the next two days asking about your experience. I hope that your e-visit has been valuable and will speed your recovery.

## 2015-11-28 DIAGNOSIS — J3081 Allergic rhinitis due to animal (cat) (dog) hair and dander: Secondary | ICD-10-CM | POA: Diagnosis not present

## 2015-11-28 DIAGNOSIS — J3089 Other allergic rhinitis: Secondary | ICD-10-CM | POA: Diagnosis not present

## 2015-12-06 NOTE — Progress Notes (Signed)
HPI:  Here for CPE:  -Concerns and/or follow up today:  Obesity: -has lost 30 lbs since established care here! -"cut out sugar", trying to eat healthy and Crossfit -he reports he feels better and is very happy about progress  -Diet: variety of foods, balance and well rounded  -Exercise: regular exercise 5-6 days per week  -Diabetes and Dyslipidemia Screening: Fasting for labs  -Hx of HTN: no  -Vaccines: UTD - will get flu shot at work  -sexual activity: yes, male partner, no new partners  -wants STI testing, Hep C screening (if born 48-1963): no  -FH colon or prstate ca: see FH Last colon cancer screening:n/a Last prostate ca screening:n/a  -Alcohol, Tobacco, drug use: see social history  Review of Systems - no fevers, unintentional weight loss, vision loss, hearing loss, chest pain, sob, hemoptysis, melena, hematochezia, hematuria, genital discharge, changing or concerning skin lesions, bleeding, bruising, loc, thoughts of self harm or SI  Past Medical History:  Diagnosis Date  . Allergic rhinitis 08/30/2013  . Asthma   . GERD (gastroesophageal reflux disease)   . Hx of retinal detachment - [facility] 08/30/2013  . OSA (obstructive sleep apnea) 02/14/2015  . Varicocele, evaluated by urologist in the past 08/30/2013    Past Surgical History:  Procedure Laterality Date  . EYE SURGERY     bilateral retinal detachment  . TESTICLE SURGERY    . TONSILLECTOMY AND ADENOIDECTOMY      Family History  Problem Relation Age of Onset  . Hyperlipidemia Father   . Hypertension Paternal Grandmother   . Heart disease Paternal Grandmother   . Heart attack Paternal Grandmother   . Heart disease Maternal Grandmother   . Heart disease Maternal Grandfather   . Heart disease Paternal Grandfather   . Heart attack Paternal Grandfather     Social History   Social History  . Marital status: Single    Spouse name: N/A  . Number of children: N/A  . Years of  education: N/A   Social History Main Topics  . Smoking status: Never Smoker  . Smokeless tobacco: Never Used  . Alcohol use 0.0 oz/week     Comment: occ 1-2 drinks  . Drug use: Unknown  . Sexual activity: Not Asked   Other Topics Concern  . 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: lives alone      Spiritual Beliefs: Christian      Lifestyle: regular CV exercise; working on a healthy diet                 Current Outpatient Prescriptions:  .  albuterol (PROAIR HFA) 108 (90 BASE) MCG/ACT inhaler, Inhale 2 puffs into the lungs every 4 (four) hours as needed for wheezing or shortness of breath., Disp: 1 Inhaler, Rfl: 0 .  budesonide-formoterol (SYMBICORT) 160-4.5 MCG/ACT inhaler, Inhale 2 puffs into the lungs 2 (two) times daily., Disp: , Rfl:  .  levocetirizine (XYZAL) 5 MG tablet, Take 5 mg by mouth every evening., Disp: , Rfl:  .  montelukast (SINGULAIR) 10 MG tablet, Take 10 mg by mouth at bedtime., Disp: , Rfl:  .  NON FORMULARY, Allergy injections, Disp: , Rfl:  .  Olopatadine HCl (PATANASE NA), Place into the nose daily., Disp: , Rfl:  .  pantoprazole (PROTONIX) 20 MG tablet, TAKE 1 TABLET BY MOUTH DAILY AS NEEDED., Disp: 30 tablet, Rfl: 0  EXAM:  Vitals:   12/07/15  0813  BP: 122/90  Pulse: 80  Temp: 98.6 F (37 C)  TempSrc: Oral  Weight: 292 lb 12.8 oz (132.8 kg)  Height: 6' 2.5" (1.892 m)    Estimated body mass index is 37.09 kg/m as calculated from the following:   Height as of this encounter: 6' 2.5" (1.892 m).   Weight as of this encounter: 292 lb 12.8 oz (132.8 kg).  GENERAL: vitals reviewed and listed below, alert, oriented, appears well hydrated and in no acute distress  HEENT: head atraumatic, PERRLA, normal appearance of eyes, ears, nose and mouth. moist mucus membranes.  NECK: supple, no masses or lymphadenopathy  LUNGS: clear to auscultation bilaterally, no rales, rhonchi or wheeze  CV: HRRR, no  peripheral edema or cyanosis, normal pedal pulses  ABDOMEN: bowel sounds normal, soft, non tender to palpation, no masses, no rebound or guarding  GU: declined  SKIN: no rash or abnormal lesions, declined full exam - scheduled with derm for skin exam  MS: normal gait, moves all extremities normally  NEURO: normal gait, speech and thought processing grossly intact, muscle tone grossly intact throughout  PSYCH: normal affect, pleasant and cooperative  ASSESSMENT AND PLAN:  Discussed the following assessment and plan:  Encounter for preventive health examination - Plan: Hemoglobin A1c, Lipid Panel, Basic metabolic panel  BMI [id number], adult   -Discussed and advised all [place] preventive services health task force level A and B recommendations for age, sex and risks.  --Advised at least 150 minutes of exercise per week and a healthy diet with avoidance of (less then 1 serving per week) processed foods, white starches, red meat, fast foods and sweets and consisting of: * 5-9 servings of fresh fruits and vegetables (not corn or potatoes) *nuts and seeds, beans *olives and olive oil *lean meats such as fish and white chicken  *whole grains  -labs, studies and vaccines per orders this encounter   Patient advised to return to clinic immediately if symptoms worsen or persist or new concerns.  Patient Instructions  BEFORE YOU LEAVE: -follow up: yearly for physical -labs  Skin exam with dermatologist  We have ordered labs or studies at this visit. It can take up to 1-2 weeks for results and processing. IF results require follow up or explanation, we will call you with instructions. Clinically stable results will be released to your [facility]. If you have not heard from [place] or cannot find your results in [facility] in 2 weeks please contact our office at [phone].  If you are not yet signed up for [facility], please consider signing up.        We recommend the following healthy  lifestyle: 1) Small portions - eat off of salad plate instead of dinner plate 2) Eat a healthy clean diet with avoidance of (less then 1 serving per week) processed foods, sweetened drinks, white starches, red meat, fast foods and sweets and consisting of: * 5-9 servings per day of fresh or frozen fruits and vegetables (not corn or potatoes, not dried or canned) *nuts and seeds, beans *olives and olive oil *small portions of lean meats such as fish and white chicken  *small portions of whole grains 3)Get at least 150 minutes of sweaty aerobic exercise per week 4)reduce stress - counseling, meditation, relaxation to balance other aspects of your life     No Follow-up on file.   Colin Benton R., DO

## 2015-12-07 ENCOUNTER — Ambulatory Visit (INDEPENDENT_AMBULATORY_CARE_PROVIDER_SITE_OTHER): Payer: 59 | Admitting: Family Medicine

## 2015-12-07 ENCOUNTER — Encounter: Payer: 59 | Admitting: Family Medicine

## 2015-12-07 ENCOUNTER — Encounter: Payer: Self-pay | Admitting: Family Medicine

## 2015-12-07 VITALS — BP 122/90 | HR 80 | Temp 98.6°F | Ht 74.5 in | Wt 292.8 lb

## 2015-12-07 DIAGNOSIS — Z6837 Body mass index (BMI) 37.0-37.9, adult: Secondary | ICD-10-CM

## 2015-12-07 DIAGNOSIS — Z0000 Encounter for general adult medical examination without abnormal findings: Secondary | ICD-10-CM

## 2015-12-07 DIAGNOSIS — J3089 Other allergic rhinitis: Secondary | ICD-10-CM | POA: Diagnosis not present

## 2015-12-07 DIAGNOSIS — J3081 Allergic rhinitis due to animal (cat) (dog) hair and dander: Secondary | ICD-10-CM | POA: Diagnosis not present

## 2015-12-07 LAB — BASIC METABOLIC PANEL
BUN: 17 mg/dL (ref 6–23)
CALCIUM: 9.1 mg/dL (ref 8.4–10.5)
CO2: 29 mEq/L (ref 19–32)
CREATININE: 1.24 mg/dL (ref 0.40–1.50)
Chloride: 102 mEq/L (ref 96–112)
GFR: 73.52 mL/min (ref >60.00)
Glucose, Bld: 85 mg/dL (ref 70–99)
Potassium: 4 mEq/L (ref 3.5–5.1)
Sodium: 138 mEq/L (ref 135–145)

## 2015-12-07 LAB — LIPID PANEL
CHOL/HDL RATIO: 3
CHOLESTEROL: 133 mg/dL (ref 0–200)
HDL: 43.9 mg/dL (ref >39.00)
LDL CALC: 72 mg/dL (ref 0–99)
NonHDL: 88.85
TRIGLYCERIDES: 85 mg/dL (ref 0.0–149.0)
VLDL: 17 mg/dL (ref 0.0–40.0)

## 2015-12-07 LAB — HEMOGLOBIN A1C: Hgb A1c MFr Bld: 5.4 % (ref 4.6–6.5)

## 2015-12-07 NOTE — Progress Notes (Signed)
Pre visit review using our clinic review tool, if applicable. No additional management support is needed unless otherwise documented below in the visit note. 

## 2015-12-07 NOTE — Patient Instructions (Signed)
BEFORE YOU LEAVE: -follow up: yearly for physical -labs  Skin exam with dermatologist  We have ordered labs or studies at this visit. It can take up to 1-2 weeks for results and processing. IF results require follow up or explanation, we will call you with instructions. Clinically stable results will be released to your [facility]. If you have not heard from [place] or cannot find your results in [facility] in 2 weeks please contact our office at [phone].  If you are not yet signed up for [facility], please consider signing up.        We recommend the following healthy lifestyle: 1) Small portions - eat off of salad plate instead of dinner plate 2) Eat a healthy clean diet with avoidance of (less then 1 serving per week) processed foods, sweetened drinks, white starches, red meat, fast foods and sweets and consisting of: * 5-9 servings per day of fresh or frozen fruits and vegetables (not corn or potatoes, not dried or canned) *nuts and seeds, beans *olives and olive oil *small portions of lean meats such as fish and white chicken  *small portions of whole grains 3)Get at least 150 minutes of sweaty aerobic exercise per week 4)reduce stress - counseling, meditation, relaxation to balance other aspects of your life

## 2015-12-12 DIAGNOSIS — J301 Allergic rhinitis due to pollen: Secondary | ICD-10-CM | POA: Diagnosis not present

## 2015-12-12 DIAGNOSIS — J3089 Other allergic rhinitis: Secondary | ICD-10-CM | POA: Diagnosis not present

## 2015-12-12 DIAGNOSIS — J3081 Allergic rhinitis due to animal (cat) (dog) hair and dander: Secondary | ICD-10-CM | POA: Diagnosis not present

## 2015-12-14 DIAGNOSIS — J3089 Other allergic rhinitis: Secondary | ICD-10-CM | POA: Diagnosis not present

## 2015-12-14 DIAGNOSIS — J3081 Allergic rhinitis due to animal (cat) (dog) hair and dander: Secondary | ICD-10-CM | POA: Diagnosis not present

## 2015-12-21 DIAGNOSIS — J3089 Other allergic rhinitis: Secondary | ICD-10-CM | POA: Diagnosis not present

## 2015-12-21 DIAGNOSIS — J3081 Allergic rhinitis due to animal (cat) (dog) hair and dander: Secondary | ICD-10-CM | POA: Diagnosis not present

## 2015-12-28 ENCOUNTER — Other Ambulatory Visit: Payer: Self-pay | Admitting: Family Medicine

## 2015-12-28 MED FILL — PANTOPRAZOLE SOD DR 20 MG T: 20 | 30 days supply | Qty: 30 | Fill #0

## 2015-12-28 NOTE — Telephone Encounter (Signed)
Rx refill sent to pharmacy. 

## 2015-12-29 DIAGNOSIS — J3089 Other allergic rhinitis: Secondary | ICD-10-CM | POA: Diagnosis not present

## 2015-12-29 DIAGNOSIS — J3081 Allergic rhinitis due to animal (cat) (dog) hair and dander: Secondary | ICD-10-CM | POA: Diagnosis not present

## 2016-01-02 DIAGNOSIS — J3081 Allergic rhinitis due to animal (cat) (dog) hair and dander: Secondary | ICD-10-CM | POA: Diagnosis not present

## 2016-01-02 DIAGNOSIS — J3089 Other allergic rhinitis: Secondary | ICD-10-CM | POA: Diagnosis not present

## 2016-01-09 DIAGNOSIS — J3089 Other allergic rhinitis: Secondary | ICD-10-CM | POA: Diagnosis not present

## 2016-01-09 DIAGNOSIS — J3081 Allergic rhinitis due to animal (cat) (dog) hair and dander: Secondary | ICD-10-CM | POA: Diagnosis not present

## 2016-01-10 DIAGNOSIS — D225 Melanocytic nevi of trunk: Secondary | ICD-10-CM | POA: Diagnosis not present

## 2016-01-10 DIAGNOSIS — L738 Other specified follicular disorders: Secondary | ICD-10-CM | POA: Diagnosis not present

## 2016-01-10 DIAGNOSIS — L814 Other melanin hyperpigmentation: Secondary | ICD-10-CM | POA: Diagnosis not present

## 2016-01-10 DIAGNOSIS — D1801 Hemangioma of skin and subcutaneous tissue: Secondary | ICD-10-CM | POA: Diagnosis not present

## 2016-01-10 DIAGNOSIS — L813 Cafe au lait spots: Secondary | ICD-10-CM | POA: Diagnosis not present

## 2016-01-10 DIAGNOSIS — D2262 Melanocytic nevi of left upper limb, including shoulder: Secondary | ICD-10-CM | POA: Diagnosis not present

## 2016-01-10 DIAGNOSIS — D2271 Melanocytic nevi of right lower limb, including hip: Secondary | ICD-10-CM | POA: Diagnosis not present

## 2016-01-16 DIAGNOSIS — J3089 Other allergic rhinitis: Secondary | ICD-10-CM | POA: Diagnosis not present

## 2016-01-16 DIAGNOSIS — J3081 Allergic rhinitis due to animal (cat) (dog) hair and dander: Secondary | ICD-10-CM | POA: Diagnosis not present

## 2016-01-19 ENCOUNTER — Telehealth: Payer: 59 | Admitting: Family

## 2016-01-19 DIAGNOSIS — R300 Dysuria: Secondary | ICD-10-CM

## 2016-01-19 NOTE — Progress Notes (Signed)
We are sorry that you are not feeling well.  Here is how we plan to help!  Male bladder infections are not very common.  We worry about prostate or kidney conditions.  The standard of care is to examine the abdomen and kidneys, and to do a urine and blood test to make sure that something more serious is not going on.  We recommend that you see a provider today.  If your doctor's office is closed [facility] has the following Urgent Cares:  If you need care fast and have a high deductible or no insurance consider:   [email or web address]  [phone]  [address], [address] [place], [place] [address] 8 am to 8 pm Monday-Friday 10 am to 4 pm Saturday-Sunday   The following sites will take your  insurance:    . [facility]  [phone] Get Driving Directions Find a Provider at this Location  [address] [place], [place] [address] . 10 am to 8 pm Monday-Friday . 12 pm to 8 pm Saturday-Sunday   . [facility]  [phone] Get Driving Directions Find a Provider at this Location  [address], [address] , [place] [address] . 8 am to 8 pm Monday-Friday . 9 am to 6 pm Saturday . 11 am to 6 pm Sunday   . [facility] [place]  [phone] Get Driving Directions  [address] [place], [place] [address] . 8 am to 8 pm Monday-Friday . 8 am to 4 pm Saturday-Sunday   . [facility] (a walk in primary care provider)  [phone]  Get Driving Directions Find a Provider at this Location  [address] [place], [place] [address] . 8 am to 8:30 pm Monday-Thursday . 8 am to 6 pm Friday . 8 am to 4 pm Saturday-Sunday   Your e-visit answers were reviewed by a board certified advanced clinical practitioner to complete your personal care plan.  Thank you for using e-Visits.   

## 2016-01-21 NOTE — Progress Notes (Signed)
HPI:  Acute visit for "bladder discomfort": -2 days last week after held urine for a long time on a car ride -symptoms was mild pain in mid lower abd -symptoms have now been completely resolved for several days -he was constipated at the time and symptoms resolved after prunes for constipation and BM -he did not have any: fever, malaise, nausea, diarrhea, vomiting, dysuria, pain with urination, urgency, frequency, hematuria, flank pain, penile discharge -mother thought he may have uti   ROS: See pertinent positives and negatives per HPI.  Past Medical History:  Diagnosis Date  . Allergic rhinitis 08/30/2013  . Asthma   . GERD (gastroesophageal reflux disease)   . Hx of retinal detachment - [facility] 08/30/2013  . OSA (obstructive sleep apnea) 02/14/2015  . Varicocele, evaluated by urologist in the past 08/30/2013    Past Surgical History:  Procedure Laterality Date  . EYE SURGERY     bilateral retinal detachment  . TESTICLE SURGERY    . TONSILLECTOMY AND ADENOIDECTOMY      Family History  Problem Relation Age of Onset  . Hyperlipidemia Father   . Hypertension Paternal Grandmother   . Heart disease Paternal Grandmother   . Heart attack Paternal Grandmother   . Heart disease Maternal Grandmother   . Heart disease Maternal Grandfather   . Heart disease Paternal Grandfather   . Heart attack Paternal Grandfather     Social History   Social History  . Marital status: Single    Spouse name: N/A  . Number of children: N/A  . Years of education: N/A   Social History Main Topics  . Smoking status: Never Smoker  . Smokeless tobacco: Never Used  . Alcohol use 0.0 oz/week     Comment: occ 1-2 drinks  . Drug use: Unknown  . Sexual activity: Not Asked   Other Topics Concern  . 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: lives alone      Spiritual Beliefs: Christian      Lifestyle: regular CV exercise;  working on a healthy diet                 Current Outpatient Prescriptions:  .  albuterol (PROAIR HFA) 108 (90 BASE) MCG/ACT inhaler, Inhale 2 puffs into the lungs every 4 (four) hours as needed for wheezing or shortness of breath., Disp: 1 Inhaler, Rfl: 0 .  budesonide-formoterol (SYMBICORT) 160-4.5 MCG/ACT inhaler, Inhale 2 puffs into the lungs 2 (two) times daily., Disp: , Rfl:  .  levocetirizine (XYZAL) 5 MG tablet, Take 5 mg by mouth every evening., Disp: , Rfl:  .  montelukast (SINGULAIR) 10 MG tablet, Take 10 mg by mouth at bedtime., Disp: , Rfl:  .  NON FORMULARY, Allergy injections, Disp: , Rfl:  .  Olopatadine HCl (PATANASE NA), Place into the nose daily., Disp: , Rfl:  .  pantoprazole (PROTONIX) 20 MG tablet, TAKE 1 TABLET BY MOUTH DAILY AS NEEDED., Disp: 30 tablet, Rfl: 0  EXAM:  Vitals:   01/22/16 0948  BP: 122/68  Pulse: 78  Temp: 98.3 F (36.8 C)    Body mass index is 37.04 kg/m.  GENERAL: vitals reviewed and listed above, alert, oriented, appears well hydrated and in no acute distress  HEENT: atraumatic, conjunttiva clear, no obvious abnormalities on inspection of external nose and ears  NECK: no obvious masses on inspection  LUNGS: clear to auscultation bilaterally, no wheezes, rales or  rhonchi, good air movement  CV: HRRR, no peripheral edema  ABD: BS+, soft, NTTP  MS: moves all extremities without noticeable abnormality  PSYCH: pleasant and cooperative, no obvious depression or anxiety  ASSESSMENT AND PLAN:  Discussed the following assessment and plan:  Lower abdominal pain - Plan: POC Urinalysis Dipstick  Constipation, unspecified constipation type  -seems symptoms were more likely related to constipation or diet/bowl issue then urinary -since resolved and UA normal opted for observation and f/u if recurrent or other concerns -bowel regimen for constipation discussed -Patient advised to return or notify a doctor immediately if symptoms  worsen or persist or new concerns arise.  There are no Patient Instructions on file for this visit.  Colin Benton R., DO

## 2016-01-22 ENCOUNTER — Encounter: Payer: Self-pay | Admitting: Family Medicine

## 2016-01-22 ENCOUNTER — Ambulatory Visit (INDEPENDENT_AMBULATORY_CARE_PROVIDER_SITE_OTHER): Payer: 59 | Admitting: Family Medicine

## 2016-01-22 VITALS — BP 122/68 | HR 78 | Temp 98.3°F | Ht 74.5 in | Wt 292.4 lb

## 2016-01-22 DIAGNOSIS — R1030 Lower abdominal pain, unspecified: Secondary | ICD-10-CM | POA: Diagnosis not present

## 2016-01-22 DIAGNOSIS — J3089 Other allergic rhinitis: Secondary | ICD-10-CM | POA: Diagnosis not present

## 2016-01-22 DIAGNOSIS — K5900 Constipation, unspecified: Secondary | ICD-10-CM | POA: Diagnosis not present

## 2016-01-22 DIAGNOSIS — J3081 Allergic rhinitis due to animal (cat) (dog) hair and dander: Secondary | ICD-10-CM | POA: Diagnosis not present

## 2016-01-22 LAB — POCT URINALYSIS DIPSTICK
BILIRUBIN UA: NEGATIVE
Blood, UA: NEGATIVE
GLUCOSE UA: NEGATIVE
KETONES UA: NEGATIVE
LEUKOCYTES UA: NEGATIVE
NITRITE UA: NEGATIVE
PH UA: 5
PROTEIN UA: NEGATIVE
Urobilinogen, UA: 0.2

## 2016-01-22 MED FILL — LEVOCETIRIZINE 5 MG TABLET: 5 | 30 days supply | Qty: 30 | Fill #2

## 2016-01-22 MED FILL — MONTELUKAST SOD 10 MG TAB: 10 | 30 days supply | Qty: 30 | Fill #2

## 2016-01-22 NOTE — Progress Notes (Signed)
Pre visit review using our clinic review tool, if applicable. No additional management support is needed unless otherwise documented below in the visit note. 

## 2016-01-23 ENCOUNTER — Encounter: Payer: Self-pay | Admitting: Family Medicine

## 2016-01-24 ENCOUNTER — Encounter: Payer: Self-pay | Admitting: Family Medicine

## 2016-01-29 ENCOUNTER — Other Ambulatory Visit: Payer: Self-pay | Admitting: *Deleted

## 2016-01-29 DIAGNOSIS — R300 Dysuria: Secondary | ICD-10-CM

## 2016-01-30 DIAGNOSIS — J3081 Allergic rhinitis due to animal (cat) (dog) hair and dander: Secondary | ICD-10-CM | POA: Diagnosis not present

## 2016-01-30 DIAGNOSIS — J3089 Other allergic rhinitis: Secondary | ICD-10-CM | POA: Diagnosis not present

## 2016-02-01 ENCOUNTER — Telehealth: Payer: 59 | Admitting: Physician Assistant

## 2016-02-01 ENCOUNTER — Encounter: Payer: Self-pay | Admitting: Family Medicine

## 2016-02-01 DIAGNOSIS — A6920 Lyme disease, unspecified: Secondary | ICD-10-CM

## 2016-02-01 NOTE — Progress Notes (Signed)
Based on what you shared with me it looks like you have a serious condition that should be evaluated in a face to face office visit. If there is truly a bulls-eye rash then there is concern for Lyme disease. You need assessment in office so the area can be examined to determine if  Lyme testing and treatment needs to be started versus other causes of symptoms -- including irritation or local infection of the skin. This is especially true since you do not remember seeing or removing a tick.  If you are having a true medical emergency please call 911.  If you need an urgent face to face visit, [place] has four urgent care centers for your convenience.  If you need care fast and have a high deductible or no insurance consider:   [email or web address]  [phone]  [address] [place], [place] [address] 8 am to 8 pm Monday-Friday 10 am to 4 pm Saturday-Sunday   The following sites will take your  insurance:    . [facility] [place] [facility] a Provider at this Location  [address] [place], [place] [address] . 10 am to 8 pm Monday-Friday . 12 pm to 8 pm Saturday-Sunday   . [facility] [place] a Provider at this Location  [address] [place], [place] [address] . 8 am to 8 pm Monday-Friday . 9 am to 6 pm Saturday . 11 am to 6 pm Sunday   . [facility] at [place] Get Driving Directions  [id number] [place], [place] [address] . 8 am to 8 pm Monday-Friday . 8 am to 4 pm Saturday-Sunday   . [facility] (walk-ins welcome, or call for a scheduled time)  [phone]  Get Driving Directions Find a Provider at this Location  [place], [place] [address] . 8 am to 8:30 pm Monday-Thursday . 8 am to 6 pm Friday . 8 am to 4 pm Saturday-Sunday     Your e-visit answers were reviewed by a board certified advanced clinical practitioner to complete your personal care plan.  Thank you for using e-Visits.

## 2016-02-01 NOTE — Progress Notes (Signed)
I called the pt, informed him of the message below and scheduled an appt for tomorrow at 11:15am.

## 2016-02-02 ENCOUNTER — Ambulatory Visit (INDEPENDENT_AMBULATORY_CARE_PROVIDER_SITE_OTHER): Payer: 59 | Admitting: Family Medicine

## 2016-02-02 ENCOUNTER — Encounter: Payer: Self-pay | Admitting: Family Medicine

## 2016-02-02 VITALS — BP 112/80 | HR 70 | Temp 97.9°F | Ht 74.5 in | Wt 285.5 lb

## 2016-02-02 DIAGNOSIS — L989 Disorder of the skin and subcutaneous tissue, unspecified: Secondary | ICD-10-CM

## 2016-02-02 NOTE — Progress Notes (Signed)
Pre visit review using our clinic review tool, if applicable. No additional management support is needed unless otherwise documented below in the visit note. 

## 2016-02-02 NOTE — Progress Notes (Signed)
HPI:  Acute visit for:  Bug bite: -happened 1 week ago -got a little infected, reports girlfriend squeezed it and some pus came out, then it hurt yesterday -he did evisit thinking " I can just take an antibiotic" - provider for evisit advised appointment -today better -no known tick bite, fevers, malaise, rash elsewhere or any other symptoms  ROS: See pertinent positives and negatives per HPI.  Past Medical History:  Diagnosis Date  . Allergic rhinitis 08/30/2013  . Asthma   . GERD (gastroesophageal reflux disease)   . Hx of retinal detachment - [facility] 08/30/2013  . OSA (obstructive sleep apnea) 02/14/2015  . Varicocele, evaluated by urologist in the past 08/30/2013    Past Surgical History:  Procedure Laterality Date  . EYE SURGERY     bilateral retinal detachment  . TESTICLE SURGERY    . TONSILLECTOMY AND ADENOIDECTOMY      Family History  Problem Relation Age of Onset  . Hyperlipidemia Father   . Hypertension Paternal Grandmother   . Heart disease Paternal Grandmother   . Heart attack Paternal Grandmother   . Heart disease Maternal Grandmother   . Heart disease Maternal Grandfather   . Heart disease Paternal Grandfather   . Heart attack Paternal Grandfather     Social History   Social History  . Marital status: Single    Spouse name: N/A  . Number of children: N/A  . Years of education: N/A   Social History Main Topics  . Smoking status: Never Smoker  . Smokeless tobacco: Never Used  . Alcohol use 0.0 oz/week     Comment: occ 1-2 drinks  . Drug use: Unknown  . Sexual activity: Not Asked   Other Topics Concern  . 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: lives alone      Spiritual Beliefs: Christian      Lifestyle: regular CV exercise; working on a healthy diet                 Current Outpatient Prescriptions:  .  albuterol (PROAIR HFA) 108 (90 BASE) MCG/ACT inhaler, Inhale 2  puffs into the lungs every 4 (four) hours as needed for wheezing or shortness of breath., Disp: 1 Inhaler, Rfl: 0 .  budesonide-formoterol (SYMBICORT) 160-4.5 MCG/ACT inhaler, Inhale 2 puffs into the lungs 2 (two) times daily., Disp: , Rfl:  .  levocetirizine (XYZAL) 5 MG tablet, Take 5 mg by mouth every evening., Disp: , Rfl:  .  montelukast (SINGULAIR) 10 MG tablet, Take 10 mg by mouth at bedtime., Disp: , Rfl:  .  NON FORMULARY, Allergy injections, Disp: , Rfl:  .  Olopatadine HCl (PATANASE NA), Place into the nose daily., Disp: , Rfl:  .  pantoprazole (PROTONIX) 20 MG tablet, TAKE 1 TABLET BY MOUTH DAILY AS NEEDED., Disp: 30 tablet, Rfl: 0  EXAM:  Vitals:   02/02/16 1127  BP: 112/80  Pulse: 70  Temp: 97.9 F (36.6 C)    Body mass index is 36.17 kg/m.  GENERAL: vitals reviewed and listed above, alert, oriented, appears well hydrated and in no acute distress  HEENT: atraumatic, conjunttiva clear, no obvious abnormalities on inspection of external nose and ears  NECK: no obvious masses on inspection  SKIN: small scab lowe back, no surrounding erythema, induration or any signs of infection today  MS: moves all extremities without noticeable abnormality  PSYCH: pleasant and cooperative, no obvious depression  or anxiety  ASSESSMENT AND PLAN:  Discussed the following assessment and plan:  Skin lesion -looks good today and appears to be healing - likely insect bite -likely was mildly infected per hx, but now no signs infection -discussed signs symptoms tick born illness and advise to seek care promptly if any should develop -Patient advised to return or notify a doctor immediately if symptoms worsen or persist or new concerns arise.  There are no Patient Instructions on file for this visit.  Colin Benton R., DO

## 2016-02-06 DIAGNOSIS — J3089 Other allergic rhinitis: Secondary | ICD-10-CM | POA: Diagnosis not present

## 2016-02-06 DIAGNOSIS — J3081 Allergic rhinitis due to animal (cat) (dog) hair and dander: Secondary | ICD-10-CM | POA: Diagnosis not present

## 2016-02-19 DIAGNOSIS — R300 Dysuria: Secondary | ICD-10-CM | POA: Diagnosis not present

## 2016-02-22 DIAGNOSIS — J3081 Allergic rhinitis due to animal (cat) (dog) hair and dander: Secondary | ICD-10-CM | POA: Diagnosis not present

## 2016-02-22 DIAGNOSIS — J3089 Other allergic rhinitis: Secondary | ICD-10-CM | POA: Diagnosis not present

## 2016-02-27 DIAGNOSIS — J3089 Other allergic rhinitis: Secondary | ICD-10-CM | POA: Diagnosis not present

## 2016-02-27 DIAGNOSIS — J3081 Allergic rhinitis due to animal (cat) (dog) hair and dander: Secondary | ICD-10-CM | POA: Diagnosis not present

## 2016-02-27 DIAGNOSIS — J301 Allergic rhinitis due to pollen: Secondary | ICD-10-CM | POA: Diagnosis not present

## 2016-03-05 DIAGNOSIS — J3089 Other allergic rhinitis: Secondary | ICD-10-CM | POA: Diagnosis not present

## 2016-03-05 DIAGNOSIS — J3081 Allergic rhinitis due to animal (cat) (dog) hair and dander: Secondary | ICD-10-CM | POA: Diagnosis not present

## 2016-03-12 DIAGNOSIS — J3081 Allergic rhinitis due to animal (cat) (dog) hair and dander: Secondary | ICD-10-CM | POA: Diagnosis not present

## 2016-03-12 DIAGNOSIS — J3089 Other allergic rhinitis: Secondary | ICD-10-CM | POA: Diagnosis not present

## 2016-03-19 DIAGNOSIS — J3089 Other allergic rhinitis: Secondary | ICD-10-CM | POA: Diagnosis not present

## 2016-03-19 DIAGNOSIS — J3081 Allergic rhinitis due to animal (cat) (dog) hair and dander: Secondary | ICD-10-CM | POA: Diagnosis not present

## 2016-03-24 ENCOUNTER — Telehealth: Payer: 59 | Admitting: Family

## 2016-03-24 DIAGNOSIS — B9789 Other viral agents as the cause of diseases classified elsewhere: Secondary | ICD-10-CM | POA: Diagnosis not present

## 2016-03-24 DIAGNOSIS — J329 Chronic sinusitis, unspecified: Secondary | ICD-10-CM | POA: Diagnosis not present

## 2016-03-24 MED ORDER — FLUTICASONE PROPIONATE 50 MCG/ACT NA SUSP: 2.0000 | Freq: Every day | NASAL | 6 refills | Status: DC

## 2016-03-24 MED ORDER — MOMETASONE FUROATE 50 MCG/ACT NA SUSP: 2.0000 | Freq: Every day | NASAL | 12 refills | Status: DC

## 2016-03-24 NOTE — Addendum Note (Signed)
Addended by: Evelina Dun A on: 03/24/2016 12:46 PM   Modules accepted: Orders

## 2016-03-24 NOTE — Progress Notes (Signed)

## 2016-03-27 MED ORDER — AZITHROMYCIN 250 MG PO TABS: ORAL_TABLET | ORAL | 0 refills | Status: DC

## 2016-03-27 MED FILL — AZITHROMYCIN 250 MG TABLET: 250 | 5 days supply | Qty: 6 | Fill #0

## 2016-03-27 NOTE — Addendum Note (Signed)
Addended by: Chevis Pretty on: 03/27/2016 08:21 AM   Modules accepted: Orders

## 2016-04-02 DIAGNOSIS — J301 Allergic rhinitis due to pollen: Secondary | ICD-10-CM | POA: Diagnosis not present

## 2016-04-02 DIAGNOSIS — J3089 Other allergic rhinitis: Secondary | ICD-10-CM | POA: Diagnosis not present

## 2016-04-02 DIAGNOSIS — J3081 Allergic rhinitis due to animal (cat) (dog) hair and dander: Secondary | ICD-10-CM | POA: Diagnosis not present

## 2016-04-03 ENCOUNTER — Other Ambulatory Visit: Payer: Self-pay | Admitting: Family Medicine

## 2016-04-04 MED FILL — PANTOPRAZOLE SOD DR 20 MG T: 20 | 30 days supply | Qty: 30 | Fill #0

## 2016-04-08 ENCOUNTER — Telehealth: Payer: 59 | Admitting: Nurse Practitioner

## 2016-04-08 DIAGNOSIS — H1030 Unspecified acute conjunctivitis, unspecified eye: Secondary | ICD-10-CM

## 2016-04-08 MED ORDER — POLYMYXIN B-TRIMETHOPRIM 10000-0.1 UNIT/ML-% OP SOLN: 2.0000 [drp] | OPHTHALMIC | 0 refills | Status: DC

## 2016-04-08 NOTE — Progress Notes (Signed)

## 2016-04-09 DIAGNOSIS — J3081 Allergic rhinitis due to animal (cat) (dog) hair and dander: Secondary | ICD-10-CM | POA: Diagnosis not present

## 2016-04-09 DIAGNOSIS — J4530 Mild persistent asthma, uncomplicated: Secondary | ICD-10-CM | POA: Diagnosis not present

## 2016-04-09 DIAGNOSIS — K219 Gastro-esophageal reflux disease without esophagitis: Secondary | ICD-10-CM | POA: Diagnosis not present

## 2016-04-09 DIAGNOSIS — J3089 Other allergic rhinitis: Secondary | ICD-10-CM | POA: Diagnosis not present

## 2016-04-09 MED FILL — POLYMYXIN B/TMP EYE DROPS: 10000-0.1 | 10 days supply | Qty: 10 | Fill #0

## 2016-04-10 DIAGNOSIS — J3089 Other allergic rhinitis: Secondary | ICD-10-CM | POA: Diagnosis not present

## 2016-04-10 DIAGNOSIS — J3081 Allergic rhinitis due to animal (cat) (dog) hair and dander: Secondary | ICD-10-CM | POA: Diagnosis not present

## 2016-04-11 DIAGNOSIS — J3081 Allergic rhinitis due to animal (cat) (dog) hair and dander: Secondary | ICD-10-CM | POA: Diagnosis not present

## 2016-04-19 ENCOUNTER — Other Ambulatory Visit: Payer: Self-pay | Admitting: Nurse Practitioner

## 2016-04-23 DIAGNOSIS — J3089 Other allergic rhinitis: Secondary | ICD-10-CM | POA: Diagnosis not present

## 2016-04-23 DIAGNOSIS — J3081 Allergic rhinitis due to animal (cat) (dog) hair and dander: Secondary | ICD-10-CM | POA: Diagnosis not present

## 2016-04-30 DIAGNOSIS — J3089 Other allergic rhinitis: Secondary | ICD-10-CM | POA: Diagnosis not present

## 2016-04-30 DIAGNOSIS — J3081 Allergic rhinitis due to animal (cat) (dog) hair and dander: Secondary | ICD-10-CM | POA: Diagnosis not present

## 2016-05-07 DIAGNOSIS — J3081 Allergic rhinitis due to animal (cat) (dog) hair and dander: Secondary | ICD-10-CM | POA: Diagnosis not present

## 2016-05-07 DIAGNOSIS — J3089 Other allergic rhinitis: Secondary | ICD-10-CM | POA: Diagnosis not present

## 2016-05-08 ENCOUNTER — Ambulatory Visit (INDEPENDENT_AMBULATORY_CARE_PROVIDER_SITE_OTHER): Payer: 59 | Admitting: Pulmonary Disease

## 2016-05-08 ENCOUNTER — Encounter: Payer: Self-pay | Admitting: Pulmonary Disease

## 2016-05-08 VITALS — BP 120/80 | HR 73 | Ht 74.25 in | Wt 285.6 lb

## 2016-05-08 DIAGNOSIS — E669 Obesity, unspecified: Secondary | ICD-10-CM

## 2016-05-08 DIAGNOSIS — G4733 Obstructive sleep apnea (adult) (pediatric): Secondary | ICD-10-CM

## 2016-05-08 NOTE — Patient Instructions (Signed)
Call if you need an order to get your CPAP mask refitted  Can look up online for different CPAP mask options  Follow up in 1 year

## 2016-05-08 NOTE — Progress Notes (Signed)
Current Outpatient Prescriptions on File Prior to Visit  Medication Sig  . albuterol (PROAIR HFA) 108 (90 BASE) MCG/ACT inhaler Inhale 2 puffs into the lungs every 4 (four) hours as needed for wheezing or shortness of breath.  . pantoprazole (PROTONIX) 20 MG tablet TAKE 1 TABLET BY MOUTH DAILY AS NEEDED.   No current facility-administered medications on file prior to visit.      Chief Complaint  Patient presents with  . Follow-up    1 yr follow up. Breathing has been ok since last visit.      Sleep tests HST 02/10/15 >> AHI 44.4, SaO2 low 76%. Auto CPAP 04/07/16 to 05/06/16 >> used on 28 of 30 nights with average 6 hrs 30 min.  Average AHI 2.2 with median CPAP 9 and 95 th percentile CPAP 13 cm H2O  Past medical hx Asthma, GERD, Allergies  Past surgical hx, Allergies, Family hx, Social hx all reviewed.  Vital Signs BP 120/80 (BP Location: Left Arm, Patient Position: Sitting, Cuff Size: Normal)   Pulse 73   Ht 6' 2.25" (1.886 m)   Wt 285 lb 9.6 oz (129.5 kg)   SpO2 99%   BMI 36.42 kg/m   History of Present Illness Sean Allen is a 31 y.o. male with OSA.  His girlfriend has noticed that he snores still.  He is not sure if this happens intermittently, or throughout the night.  He is not aware of mask leak.  He has been exercising more, and has lost 40 lbs since last year.  He thinks his face is smaller, and that might cause his mask to leak.  He does notice intermittent episodes of mouth dryness.  He is not sure if this is when his snoring is worse also.  Physical Exam  General - pleasant ENT - no sinus tenderness, no oral exudate, MP 3 Cardiac - regular, no murmur Chest - no wheeze/rales Back - no tenderness Abd - soft, non tender Ext - no edema Neuro - normal strength Skin - no rashes Psych - normal mood   Assessment/Plan  Obstructive sleep apnea. - he is compliant with CPAP and reports benefit - he might be getting intermittent mask leak causing snoring and  dryness - advised him to d/w his DME about assessing CPAP mask fit - he can also look up CPAP mask options online and call if he wants to switch to different mask  Obesity. - encouraged him to continue with his weight loss regimen   Patient Instructions  Call if you need an order to get your CPAP mask refitted  Can look up online for different CPAP mask options  Follow up in 1 year    Chesley Mires, MD [place] Pager:  [phone]

## 2016-05-14 MED FILL — PANTOPRAZOLE SOD DR 20 MG T: 20 | 90 days supply | Qty: 90 | Fill #1

## 2016-05-16 DIAGNOSIS — J3081 Allergic rhinitis due to animal (cat) (dog) hair and dander: Secondary | ICD-10-CM | POA: Diagnosis not present

## 2016-05-16 DIAGNOSIS — J3089 Other allergic rhinitis: Secondary | ICD-10-CM | POA: Diagnosis not present

## 2016-05-21 DIAGNOSIS — J3089 Other allergic rhinitis: Secondary | ICD-10-CM | POA: Diagnosis not present

## 2016-05-21 DIAGNOSIS — J3081 Allergic rhinitis due to animal (cat) (dog) hair and dander: Secondary | ICD-10-CM | POA: Diagnosis not present

## 2016-05-28 DIAGNOSIS — J3089 Other allergic rhinitis: Secondary | ICD-10-CM | POA: Diagnosis not present

## 2016-05-28 DIAGNOSIS — J3081 Allergic rhinitis due to animal (cat) (dog) hair and dander: Secondary | ICD-10-CM | POA: Diagnosis not present

## 2016-06-11 DIAGNOSIS — J301 Allergic rhinitis due to pollen: Secondary | ICD-10-CM | POA: Diagnosis not present

## 2016-06-11 DIAGNOSIS — J3081 Allergic rhinitis due to animal (cat) (dog) hair and dander: Secondary | ICD-10-CM | POA: Diagnosis not present

## 2016-06-18 DIAGNOSIS — J3089 Other allergic rhinitis: Secondary | ICD-10-CM | POA: Diagnosis not present

## 2016-06-18 DIAGNOSIS — J3081 Allergic rhinitis due to animal (cat) (dog) hair and dander: Secondary | ICD-10-CM | POA: Diagnosis not present

## 2016-06-26 DIAGNOSIS — J3089 Other allergic rhinitis: Secondary | ICD-10-CM | POA: Diagnosis not present

## 2016-06-26 DIAGNOSIS — J3081 Allergic rhinitis due to animal (cat) (dog) hair and dander: Secondary | ICD-10-CM | POA: Diagnosis not present

## 2016-07-02 DIAGNOSIS — J3089 Other allergic rhinitis: Secondary | ICD-10-CM | POA: Diagnosis not present

## 2016-07-02 DIAGNOSIS — J3081 Allergic rhinitis due to animal (cat) (dog) hair and dander: Secondary | ICD-10-CM | POA: Diagnosis not present

## 2016-07-04 DIAGNOSIS — J3081 Allergic rhinitis due to animal (cat) (dog) hair and dander: Secondary | ICD-10-CM | POA: Diagnosis not present

## 2016-07-09 DIAGNOSIS — J3081 Allergic rhinitis due to animal (cat) (dog) hair and dander: Secondary | ICD-10-CM | POA: Diagnosis not present

## 2016-07-09 DIAGNOSIS — J3089 Other allergic rhinitis: Secondary | ICD-10-CM | POA: Diagnosis not present

## 2016-07-16 DIAGNOSIS — J3089 Other allergic rhinitis: Secondary | ICD-10-CM | POA: Diagnosis not present

## 2016-07-16 DIAGNOSIS — J3081 Allergic rhinitis due to animal (cat) (dog) hair and dander: Secondary | ICD-10-CM | POA: Diagnosis not present

## 2016-07-18 DIAGNOSIS — J3089 Other allergic rhinitis: Secondary | ICD-10-CM | POA: Diagnosis not present

## 2016-07-18 DIAGNOSIS — J3081 Allergic rhinitis due to animal (cat) (dog) hair and dander: Secondary | ICD-10-CM | POA: Diagnosis not present

## 2016-07-23 DIAGNOSIS — J3089 Other allergic rhinitis: Secondary | ICD-10-CM | POA: Diagnosis not present

## 2016-07-23 DIAGNOSIS — J3081 Allergic rhinitis due to animal (cat) (dog) hair and dander: Secondary | ICD-10-CM | POA: Diagnosis not present

## 2016-07-25 DIAGNOSIS — J3081 Allergic rhinitis due to animal (cat) (dog) hair and dander: Secondary | ICD-10-CM | POA: Diagnosis not present

## 2016-07-25 DIAGNOSIS — J3089 Other allergic rhinitis: Secondary | ICD-10-CM | POA: Diagnosis not present

## 2016-07-26 ENCOUNTER — Telehealth: Payer: 59 | Admitting: Family

## 2016-07-26 DIAGNOSIS — W57XXXA Bitten or stung by nonvenomous insect and other nonvenomous arthropods, initial encounter: Secondary | ICD-10-CM

## 2016-07-26 DIAGNOSIS — T148XXA Other injury of unspecified body region, initial encounter: Secondary | ICD-10-CM | POA: Diagnosis not present

## 2016-07-26 MED ORDER — DOXYCYCLINE HYCLATE 100 MG PO TABS: 200.0000 mg | ORAL_TABLET | Freq: Once | ORAL | 0 refills | Status: AC

## 2016-07-26 MED FILL — DOXYCYCLINE HYCLATE 100 MG: 100 | 1 days supply | Qty: 2 | Fill #0

## 2016-07-26 NOTE — Progress Notes (Signed)
Thank you for describing your tick bite, Here is how we plan to help! Based on the information that you shared with me it looks like you have A tick that bite that we will treat with a short course of doxycycline.  In most cases a tick bite is painless and does not itch.  Most tick bites in which the tick is quickly removed do not require prescriptions. Ticks can transmit several diseases if they are infected and remain attacked to your skin. Therefore the length that the tick was attached and any symptoms you have experienced after the bite are import to accurately develop your custom treatment plan. In most cases a single dose of doxycycline may prevent the development of a more serious condition.  Based on your information I have Provided a home care guide for tick bites  and  instructions on when to call for help. and I have sent a single dose of doxycycline to the pharmacy you selected. Please make sure that you selected a pharmacy that is open now.  Which ticks  are associated with illness?  The Wood Tick (dog tick) is the size of a watermelon seed and can sometimes transmit [place] spotted fever and [place] tick fever.   The Deer Tick (black-legged tick) is between the size of a poppy seed (pin head) and an apple seed, and can sometimes transmit Lyme disease.  A brown to black tick with a white splotch on its back is likely a male Amblyomma americanum ([place] tick). This tick has been associated with Southern Tick Associated illness ( STARI)  Lyme disease has become the most common tick-borne illness in the [place]. The risk of Lyme disease following a recognized deer tick bite is estimated to be 1%.  The majority of cases of Lyme disease start with a bull's eye rash at the site of the tick bite. The rash can occur days to weeks (typically 7-10 days) after a tick bite. Treatment with antibiotics is indicated if this rash appears. Flu-like symptoms may accompany the rash,  including: fever, chills, headaches, muscle aches, and fatigue. Removing ticks promptly may prevent tick borne disease.  What can be used to prevent Tick Bites?   Insect repellant with at leas 20% DEET.  Wearing long pants with sock and shoes.  Avoiding tall grass and heavily wooded areas.  Checking your skin after being outdoors.  Shower with a washcloth after outdoor exposures.  HOME CARE ADVICE FOR TICK BITE  1. Wood Tick Removal:  o Use a pair of tweezers and grasp the wood tick close to the skin (on its head). Pull the wood tick straight upward without twisting or crushing it. Maintain a steady pressure until it releases its grip.   o If tweezers aren't available, use fingers, a loop of thread around the jaws, or a needle between the jaws for traction.  o Note: covering the tick with petroleum jelly, nail polish or rubbing alcohol doesn't work. Neither does touching the tick with a hot or cold object. 2. Tiny Deer Tick Removal:   o Needs to be scraped off with a knife blade or credit card edge. o Place tick in a sealed container (e.g. glass jar, zip lock plastic bag), in case your doctor wants to see it. 3. Tick's Head Removal:  o If the wood tick's head breaks off in the skin, it must be removed. Clean the skin. Then use a sterile needle to uncover the head and lift it out   or scrape it off.  o If a very small piece of the head remains, the skin will eventually slough it off. 4. Antibiotic Ointment:  o Wash the wound and your hands with soap and water after removal to prevent catching any tick disease.  Apply an over the counter antibiotic ointment (e.g. bacitracin) to the bite once. 5. Expected Course: Tick bites normally don't itch or hurt. That's why they often go unnoticed. 6. Call Your Doctor If:  o You can't remove the tick or the tick's head o Fever, a severe head ache, or rash occur in the next 2 weeks o Bite begins to look infected o Lyme's disease is common in your  area o You have not had a tetanus in the last 10 years o Your current symptoms become worse    MAKE SURE YOU   Understand these instructions.  Will watch your condition.  Will get help right away if you are not doing well or get worse.   Thank you for choosing an e-visit.  Your e-visit answers were reviewed by a board certified advanced clinical practitioner to complete your personal care plan. Depending upon the condition, your plan could have included both over the counter or prescription medications. Please review your pharmacy choice. If there is a problem you may use MyChart messaging to have the prescription routed to another pharmacy. Your safety is important to us. If you have drug allergies check your prescription carefully.   You can use MyChart to ask questions about today's visit, request a non-urgent call back, or ask for a work or school excuse for 24 hours related to this e-Visit. If it has been greater than 24 hours you will need to follow up with your provider, or enter a new e-Visit to address those concerns.  You will get an email in the next two days asking about your experience. I hope  that your e-visit has been valuable and will speed your recovery 

## 2016-07-29 DIAGNOSIS — J3089 Other allergic rhinitis: Secondary | ICD-10-CM | POA: Diagnosis not present

## 2016-07-29 DIAGNOSIS — J3081 Allergic rhinitis due to animal (cat) (dog) hair and dander: Secondary | ICD-10-CM | POA: Diagnosis not present

## 2016-08-06 DIAGNOSIS — J3089 Other allergic rhinitis: Secondary | ICD-10-CM | POA: Diagnosis not present

## 2016-08-06 DIAGNOSIS — J3081 Allergic rhinitis due to animal (cat) (dog) hair and dander: Secondary | ICD-10-CM | POA: Diagnosis not present

## 2016-08-10 DIAGNOSIS — H5211 Myopia, right eye: Secondary | ICD-10-CM | POA: Diagnosis not present

## 2016-08-10 DIAGNOSIS — H52222 Regular astigmatism, left eye: Secondary | ICD-10-CM | POA: Diagnosis not present

## 2016-08-12 ENCOUNTER — Other Ambulatory Visit: Payer: Self-pay | Admitting: Family Medicine

## 2016-08-12 DIAGNOSIS — J3089 Other allergic rhinitis: Secondary | ICD-10-CM | POA: Diagnosis not present

## 2016-08-12 DIAGNOSIS — J3081 Allergic rhinitis due to animal (cat) (dog) hair and dander: Secondary | ICD-10-CM | POA: Diagnosis not present

## 2016-08-12 MED FILL — PANTOPRAZOLE SOD DR 20 MG T: 20 | 90 days supply | Qty: 90 | Fill #0

## 2016-08-12 MED FILL — EPINEPHRINE 0.3 MG AUTO-INJ: 0.3 | 30 days supply | Qty: 2 | Fill #0

## 2016-08-21 DIAGNOSIS — J3089 Other allergic rhinitis: Secondary | ICD-10-CM | POA: Diagnosis not present

## 2016-08-21 DIAGNOSIS — J3081 Allergic rhinitis due to animal (cat) (dog) hair and dander: Secondary | ICD-10-CM | POA: Diagnosis not present

## 2016-08-29 DIAGNOSIS — J3081 Allergic rhinitis due to animal (cat) (dog) hair and dander: Secondary | ICD-10-CM | POA: Diagnosis not present

## 2016-08-29 DIAGNOSIS — J3089 Other allergic rhinitis: Secondary | ICD-10-CM | POA: Diagnosis not present

## 2016-09-03 DIAGNOSIS — J3081 Allergic rhinitis due to animal (cat) (dog) hair and dander: Secondary | ICD-10-CM | POA: Diagnosis not present

## 2016-09-03 DIAGNOSIS — J301 Allergic rhinitis due to pollen: Secondary | ICD-10-CM | POA: Diagnosis not present

## 2016-09-04 ENCOUNTER — Telehealth: Payer: Self-pay | Admitting: Pulmonary Disease

## 2016-09-04 NOTE — Telephone Encounter (Signed)
lmtcb X1 for pt No supplies have been ordered since 2016.Marland Kitchen

## 2016-09-04 NOTE — Telephone Encounter (Signed)
He says order was faxed to TP? He can be reached [phone]

## 2016-09-04 NOTE — Telephone Encounter (Signed)
Pt returning call, says an order was  Faxed to [place] from [facility] and says he has ordered since 2016, please

## 2016-09-04 NOTE — Telephone Encounter (Signed)
Spoke with pt, states that [facility] send a CMN for new cpap supplies to our office for TP to sign on Monday, and pt is following up on the status of that.    Rodena Piety please advise if a CMN for supplies has come through for this pt.  Thanks!

## 2016-09-05 NOTE — Telephone Encounter (Signed)
I have checked Dr. Juanetta Gosling folder and Tammy Parrett's folder and we don't have any forms at all on this patient

## 2016-09-06 NOTE — Telephone Encounter (Signed)
We finally have the CMN on this patient and it is for Sean Allen who is not here today to sign

## 2016-09-06 NOTE — Telephone Encounter (Signed)
I have called and spoke with Melissa and she will check and see what is needed and give [place] a call back.

## 2016-09-10 NOTE — Telephone Encounter (Signed)
Called and spoke with pt and he is aware that we do have the CMN for [facility] and will get this sent back so he can get his supplies.    Will forward to Janett Billow so she can follow up on CMN to make sure this gets signed today by TP.

## 2016-09-11 DIAGNOSIS — J3081 Allergic rhinitis due to animal (cat) (dog) hair and dander: Secondary | ICD-10-CM | POA: Diagnosis not present

## 2016-09-11 DIAGNOSIS — J3089 Other allergic rhinitis: Secondary | ICD-10-CM | POA: Diagnosis not present

## 2016-09-11 NOTE — Telephone Encounter (Signed)
Jess please advise if the CMN was signed by TP yet.  thanks

## 2016-09-12 NOTE — Telephone Encounter (Signed)
TP was not in the office yesterday. Will forward to Pierre to follow up on

## 2016-09-13 NOTE — Telephone Encounter (Signed)
Tammy Parrett signed the form this morning and I faxed it to [phone] and received confirmation

## 2016-09-17 ENCOUNTER — Telehealth: Payer: 59 | Admitting: Family

## 2016-09-17 DIAGNOSIS — J019 Acute sinusitis, unspecified: Secondary | ICD-10-CM

## 2016-09-17 DIAGNOSIS — B9689 Other specified bacterial agents as the cause of diseases classified elsewhere: Secondary | ICD-10-CM | POA: Diagnosis not present

## 2016-09-17 MED ORDER — AMOXICILLIN-POT CLAVULANATE 875-125 MG PO TABS: 1.0000 | ORAL_TABLET | Freq: Two times a day (BID) | ORAL | 0 refills | Status: DC

## 2016-09-17 MED FILL — AMOX TR-K CLV 875-125 MG TA: 875-125 | 7 days supply | Qty: 14 | Fill #0

## 2016-09-17 NOTE — Progress Notes (Signed)

## 2016-09-20 DIAGNOSIS — J3089 Other allergic rhinitis: Secondary | ICD-10-CM | POA: Diagnosis not present

## 2016-09-20 DIAGNOSIS — G4733 Obstructive sleep apnea (adult) (pediatric): Secondary | ICD-10-CM | POA: Diagnosis not present

## 2016-09-20 DIAGNOSIS — J3081 Allergic rhinitis due to animal (cat) (dog) hair and dander: Secondary | ICD-10-CM | POA: Diagnosis not present

## 2016-09-23 DIAGNOSIS — J3089 Other allergic rhinitis: Secondary | ICD-10-CM | POA: Diagnosis not present

## 2016-09-23 DIAGNOSIS — J3081 Allergic rhinitis due to animal (cat) (dog) hair and dander: Secondary | ICD-10-CM | POA: Diagnosis not present

## 2016-10-01 DIAGNOSIS — J3089 Other allergic rhinitis: Secondary | ICD-10-CM | POA: Diagnosis not present

## 2016-10-01 DIAGNOSIS — J3081 Allergic rhinitis due to animal (cat) (dog) hair and dander: Secondary | ICD-10-CM | POA: Diagnosis not present

## 2016-10-14 DIAGNOSIS — J3089 Other allergic rhinitis: Secondary | ICD-10-CM | POA: Diagnosis not present

## 2016-10-14 DIAGNOSIS — J3081 Allergic rhinitis due to animal (cat) (dog) hair and dander: Secondary | ICD-10-CM | POA: Diagnosis not present

## 2016-10-22 DIAGNOSIS — J3089 Other allergic rhinitis: Secondary | ICD-10-CM | POA: Diagnosis not present

## 2016-10-22 DIAGNOSIS — J3081 Allergic rhinitis due to animal (cat) (dog) hair and dander: Secondary | ICD-10-CM | POA: Diagnosis not present

## 2016-10-31 DIAGNOSIS — J3089 Other allergic rhinitis: Secondary | ICD-10-CM | POA: Diagnosis not present

## 2016-10-31 DIAGNOSIS — J3081 Allergic rhinitis due to animal (cat) (dog) hair and dander: Secondary | ICD-10-CM | POA: Diagnosis not present

## 2016-11-01 DIAGNOSIS — H35412 Lattice degeneration of retina, left eye: Secondary | ICD-10-CM | POA: Diagnosis not present

## 2016-11-01 DIAGNOSIS — H179 Unspecified corneal scar and opacity: Secondary | ICD-10-CM | POA: Diagnosis not present

## 2016-11-04 DIAGNOSIS — J3081 Allergic rhinitis due to animal (cat) (dog) hair and dander: Secondary | ICD-10-CM | POA: Diagnosis not present

## 2016-11-04 DIAGNOSIS — J3089 Other allergic rhinitis: Secondary | ICD-10-CM | POA: Diagnosis not present

## 2016-11-12 DIAGNOSIS — J3081 Allergic rhinitis due to animal (cat) (dog) hair and dander: Secondary | ICD-10-CM | POA: Diagnosis not present

## 2016-11-12 DIAGNOSIS — J3089 Other allergic rhinitis: Secondary | ICD-10-CM | POA: Diagnosis not present

## 2016-11-19 DIAGNOSIS — J3081 Allergic rhinitis due to animal (cat) (dog) hair and dander: Secondary | ICD-10-CM | POA: Diagnosis not present

## 2016-11-19 DIAGNOSIS — J3089 Other allergic rhinitis: Secondary | ICD-10-CM | POA: Diagnosis not present

## 2016-11-21 DIAGNOSIS — J3081 Allergic rhinitis due to animal (cat) (dog) hair and dander: Secondary | ICD-10-CM | POA: Diagnosis not present

## 2016-11-21 DIAGNOSIS — J3089 Other allergic rhinitis: Secondary | ICD-10-CM | POA: Diagnosis not present

## 2016-11-26 DIAGNOSIS — J3081 Allergic rhinitis due to animal (cat) (dog) hair and dander: Secondary | ICD-10-CM | POA: Diagnosis not present

## 2016-11-26 DIAGNOSIS — J3089 Other allergic rhinitis: Secondary | ICD-10-CM | POA: Diagnosis not present

## 2016-12-05 DIAGNOSIS — J3081 Allergic rhinitis due to animal (cat) (dog) hair and dander: Secondary | ICD-10-CM | POA: Diagnosis not present

## 2016-12-05 DIAGNOSIS — J3089 Other allergic rhinitis: Secondary | ICD-10-CM | POA: Diagnosis not present

## 2016-12-07 NOTE — Progress Notes (Signed)
HPI:  Here for CPE:  -Concerns and/or follow up today: Not eating as healthy and gained some weight. Plans to get back on track. No regular exercise currently. Wants to check kidneys because he occ takes a PPI.  -Diabetes and Dyslipidemia Screening:fasting for labs  -Hx of HTN: no  -Vaccines: UTD except flu - declines as prefers to get at work  -sexual activity: yes, male partner, no new partners  -wants STI testing, Hep C screening (if born 22-1963): no, declines  -FH colon or prstate ca: see FH  Last colon cancer screening: n/a Last prostate ca screening:n/a  -Alcohol, Tobacco, drug use: see social history  Review of Systems - no fevers, unintentional weight loss, vision loss, hearing loss, chest pain, sob, hemoptysis, melena, hematochezia, hematuria, genital discharge, changing or concerning skin lesions, bleeding, bruising, loc, thoughts of self harm or SI  Past Medical History:  Diagnosis Date  . Allergic rhinitis 08/30/2013  . Asthma   . GERD (gastroesophageal reflux disease)   . Hx of retinal detachment - [facility] 08/30/2013  . OSA (obstructive sleep apnea) 02/14/2015  . Varicocele, evaluated by urologist in the past 08/30/2013    Past Surgical History:  Procedure Laterality Date  . EYE SURGERY     bilateral retinal detachment  . TESTICLE SURGERY    . TONSILLECTOMY AND ADENOIDECTOMY      Family History  Problem Relation Age of Onset  . Hyperlipidemia Father   . Hypertension Paternal Grandmother   . Heart disease Paternal Grandmother   . Heart attack Paternal Grandmother   . Heart disease Maternal Grandmother   . Heart disease Maternal Grandfather   . Heart disease Paternal Grandfather   . Heart attack Paternal Grandfather     Social History   Social History  . Marital status: Single    Spouse name: N/A  . Number of children: N/A  . Years of education: N/A   Social History Main Topics  . Smoking status: Never Smoker  .  Smokeless tobacco: Never Used  . Alcohol use 0.0 oz/week     Comment: occ 1-2 drinks  . Drug use: Unknown  . Sexual activity: Not Asked   Other Topics Concern  . 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: lives alone      Spiritual Beliefs: Christian      Lifestyle: regular CV exercise; working on a healthy diet                 Current Outpatient Prescriptions:  .  albuterol (PROAIR HFA) 108 (90 BASE) MCG/ACT inhaler, Inhale 2 puffs into the lungs every 4 (four) hours as needed for wheezing or shortness of breath., Disp: 1 Inhaler, Rfl: 0 .  pantoprazole (PROTONIX) 20 MG tablet, TAKE 1 TABLET BY MOUTH DAILY AS NEEDED., Disp: 30 tablet, Rfl: 5  EXAM:  Vitals:   12/09/16 0812  BP: 120/82  Pulse: 64  Temp: 98.2 F (36.8 C)  TempSrc: Oral  Weight: 291 lb 14.4 oz (132.4 kg)  Height: 6' 1.5" (1.867 m)    Estimated body mass index is 37.99 kg/m as calculated from the following:   Height as of this encounter: 6' 1.5" (1.867 m).   Weight as of this encounter: 291 lb 14.4 oz (132.4 kg).  GENERAL: vitals reviewed and listed below, alert, oriented, appears well hydrated and in no acute distress  HEENT: head atraumatic, PERRLA, normal appearance of eyes, ears,  nose and mouth. moist mucus membranes.  NECK: supple, no masses or lymphadenopathy  LUNGS: clear to auscultation bilaterally, no rales, rhonchi or wheeze  CV: HRRR, no peripheral edema or cyanosis, normal pedal pulses  ABDOMEN: bowel sounds normal, soft, non tender to palpation, no masses, no rebound or guarding  GU: normal appearance of external genitalia - no lesions or masses, hernia exam normal.  RECTAL: refused  SKIN: no rash or abnormal lesions  MS: normal gait, moves all extremities normally  NEURO: normal gait, speech and thought processing grossly intact, muscle tone grossly intact throughout  PSYCH: normal affect, pleasant and cooperative  ASSESSMENT  AND PLAN:  Discussed the following assessment and plan:  There are no diagnoses linked to this encounter.  -Discussed and advised all [place] preventive services health task force level A and B recommendations for age, sex and risks.  --Advised at least 150 minutes of exercise per week and a healthy diet with avoidance of (less then 1 serving per week) processed foods, white starches, red meat, fast foods and sweets and consisting of: * 5-9 servings of fresh fruits and vegetables (not corn or potatoes) *nuts and seeds, beans *olives and olive oil *lean meats such as fish and white chicken  *whole grains  -labs, studies and vaccines per orders this encounter   Patient advised to return to clinic immediately if symptoms worsen or persist or new concerns.  Patient Instructions  BEFORE YOU LEAVE: -follow up: yearly for CPE -labs  Please get your flu shot in the next 2 months.  Vit D3 [phone] IU daily  Advise regular aerobic exercise (at least 150 minutes per week of sweaty exercise) and a healthy diet. Try to eat at least 5-9 servings of vegetables and fruits per day (not corn, potatoes or bananas.) Avoid sweets, red meat, pork, butter, fried foods, fast food, processed food, excessive dairy, eggs and coconut. Replace bad fats with good fats - fish, nuts and seeds, canola oil, olive oil.   We have ordered labs or studies at this visit. It can take up to 1-2 weeks for results and processing. IF results require follow up or explanation, we will call you with instructions. Clinically stable results will be released to your [facility]. If you have not heard from [place] or cannot find your results in [facility] in 2 weeks please contact our office at [phone].  If you are not yet signed up for [facility], please consider signing up.   Health Maintenance, Male A healthy lifestyle and preventive care is important for your health and wellness. Ask your health care provider about what schedule of regular  examinations is right for you. What should I know about weight and diet? Eat a Healthy Diet  Eat plenty of vegetables, fruits, whole grains, low-fat dairy products, and lean protein.  Do not eat a lot of foods high in solid fats, added sugars, or salt.  Maintain a Healthy Weight Regular exercise can help you achieve or maintain a healthy weight. You should:  Do at least 150 minutes of exercise each week. The exercise should increase your heart rate and make you sweat (moderate-intensity exercise).  Do strength-training exercises at least twice a week.  Watch Your Levels of Cholesterol and Blood Lipids  Have your blood tested for lipids and cholesterol every 5 years starting at 28 years of age. If you are at high risk for heart disease, you should start having your blood tested when you are 29 years old. You may need to have  your cholesterol levels checked more often if: ? Your lipid or cholesterol levels are high. ? You are older than 29 years of age. ? You are at high risk for heart disease.  What should I know about cancer screening? Many types of cancers can be detected early and may often be prevented. Lung Cancer  You should be screened every year for lung cancer if: ? You are a current smoker who has smoked for at least 30 years. ? You are a former smoker who has quit within the past 15 years.  Talk to your health care provider about your screening options, when you should start screening, and how often you should be screened.  Colorectal Cancer  Routine colorectal cancer screening usually begins at 29 years of age and should be repeated every 5-10 years until you are 29 years old. You may need to be screened more often if early forms of precancerous polyps or small growths are found. Your health care provider may recommend screening at an earlier age if you have risk factors for colon cancer.  Your health care provider may recommend using home test kits to check for hidden  blood in the stool.  A small camera at the end of a tube can be used to examine your colon (sigmoidoscopy or colonoscopy). This checks for the earliest forms of colorectal cancer.  Prostate and Testicular Cancer  Depending on your age and overall health, your health care provider may do certain tests to screen for prostate and testicular cancer.  Talk to your health care provider about any symptoms or concerns you have about testicular or prostate cancer.  Skin Cancer  Check your skin from head to toe regularly.  Tell your health care provider about any new moles or changes in moles, especially if: ? There is a change in a mole's size, shape, or color. ? You have a mole that is larger than a pencil eraser.  Always use sunscreen. Apply sunscreen liberally and repeat throughout the day.  Protect yourself by wearing long sleeves, pants, a wide-brimmed hat, and sunglasses when outside.  What should I know about heart disease, diabetes, and high blood pressure?  If you are 40-75 years of age, have your blood pressure checked every 3-5 years. If you are 44 years of age or older, have your blood pressure checked every year. You should have your blood pressure measured twice-once when you are at a hospital or clinic, and once when you are not at a hospital or clinic. Record the average of the two measurements. To check your blood pressure when you are not at a hospital or clinic, you can use: ? An automated blood pressure machine at a pharmacy. ? A home blood pressure monitor.  Talk to your health care provider about your target blood pressure.  If you are between 50-65 years old, ask your health care provider if you should take aspirin to prevent heart disease.  Have regular diabetes screenings by checking your fasting blood sugar level. ? If you are at a normal weight and have a low risk for diabetes, have this test once every three years after the age of 75. ? If you are overweight and  have a high risk for diabetes, consider being tested at a younger age or more often.  A one-time screening for abdominal aortic aneurysm (AAA) by ultrasound is recommended for men aged 67-75 years who are current or former smokers. What should I know about preventing infection? Hepatitis B If  you have a higher risk for hepatitis B, you should be screened for this virus. Talk with your health care provider to find out if you are at risk for hepatitis B infection. Hepatitis C Blood testing is recommended for:  Everyone born from [birth year] through 1963.  Anyone with known risk factors for hepatitis C.  Sexually Transmitted Diseases (STDs)  You should be screened each year for STDs including gonorrhea and chlamydia if: ? You are sexually active and are younger than 29 years of age. ? You are older than 29 years of age and your health care provider tells you that you are at risk for this type of infection. ? Your sexual activity has changed since you were last screened and you are at an increased risk for chlamydia or gonorrhea. Ask your health care provider if you are at risk.  Talk with your health care provider about whether you are at high risk of being infected with HIV. Your health care provider may recommend a prescription medicine to help prevent HIV infection.  What else can I do?  Schedule regular health, dental, and eye exams.  Stay current with your vaccines (immunizations).  Do not use any tobacco products, such as cigarettes, chewing tobacco, and e-cigarettes. If you need help quitting, ask your health care provider.  Limit alcohol intake to no more than 2 drinks per day. One drink equals 12 ounces of beer, 5 ounces of wine, or 1 ounces of hard liquor.  Do not use street drugs.  Do not share needles.  Ask your health care provider for help if you need support or information about quitting drugs.  Tell your health care provider if you often feel depressed.  Tell your  health care provider if you have ever been abused or do not feel safe at home. This information is not intended to replace advice given to you by your health care provider. Make sure you discuss any questions you have with your health care provider. Document Released: 09/28/2007 Document Revised: 11/29/2015 Document Reviewed: 01/03/2015 Elsevier Interactive Patient Education  2018 [employer].         No Follow-up on file.   Colin Benton R., DO

## 2016-12-09 ENCOUNTER — Ambulatory Visit (INDEPENDENT_AMBULATORY_CARE_PROVIDER_SITE_OTHER): Payer: 59 | Admitting: Family Medicine

## 2016-12-09 ENCOUNTER — Encounter: Payer: Self-pay | Admitting: Family Medicine

## 2016-12-09 VITALS — BP 120/82 | HR 64 | Temp 98.2°F | Ht 73.5 in | Wt 291.9 lb

## 2016-12-09 DIAGNOSIS — Z6837 Body mass index (BMI) 37.0-37.9, adult: Secondary | ICD-10-CM

## 2016-12-09 DIAGNOSIS — J3089 Other allergic rhinitis: Secondary | ICD-10-CM | POA: Diagnosis not present

## 2016-12-09 DIAGNOSIS — Z0000 Encounter for general adult medical examination without abnormal findings: Secondary | ICD-10-CM

## 2016-12-09 DIAGNOSIS — J3081 Allergic rhinitis due to animal (cat) (dog) hair and dander: Secondary | ICD-10-CM | POA: Diagnosis not present

## 2016-12-09 DIAGNOSIS — K219 Gastro-esophageal reflux disease without esophagitis: Secondary | ICD-10-CM | POA: Diagnosis not present

## 2016-12-09 LAB — BASIC METABOLIC PANEL
BUN: 16 mg/dL (ref 6–23)
CO2: 30 mEq/L (ref 19–32)
CREATININE: 1.2 mg/dL (ref 0.40–1.50)
Calcium: 9.7 mg/dL (ref 8.4–10.5)
Chloride: 103 mEq/L (ref 96–112)
GFR: 75.81 mL/min (ref >60.00)
Glucose, Bld: 99 mg/dL (ref 70–99)
Potassium: 4.6 mEq/L (ref 3.5–5.1)
Sodium: 138 mEq/L (ref 135–145)

## 2016-12-09 LAB — LIPID PANEL
CHOLESTEROL: 171 mg/dL (ref 0–200)
HDL: 42.4 mg/dL (ref >39.00)
LDL Cholesterol: 98 mg/dL (ref 0–99)
NONHDL: 128.87
Total CHOL/HDL Ratio: 4
Triglycerides: 155 mg/dL — ABNORMAL HIGH (ref 0.0–149.0)
VLDL: 31 mg/dL (ref 0.0–40.0)

## 2016-12-09 LAB — HEMOGLOBIN A1C: Hgb A1c MFr Bld: 5.3 % (ref 4.6–6.5)

## 2016-12-09 MED ORDER — PANTOPRAZOLE SODIUM 20 MG PO TBEC: 20.0000 mg | DELAYED_RELEASE_TABLET | Freq: Every day | ORAL | 5 refills | Status: DC | PRN

## 2016-12-09 MED FILL — PANTOPRAZOLE SOD DR 20 MG T: 20 | 90 days supply | Qty: 90 | Fill #1

## 2016-12-09 NOTE — Patient Instructions (Signed)
BEFORE YOU LEAVE: -follow up: yearly for CPE -labs  Please get your flu shot in the next 2 months.  Vit D3 [phone] IU daily  Advise regular aerobic exercise (at least 150 minutes per week of sweaty exercise) and a healthy diet. Try to eat at least 5-9 servings of vegetables and fruits per day (not corn, potatoes or bananas.) Avoid sweets, red meat, pork, butter, fried foods, fast food, processed food, excessive dairy, eggs and coconut. Replace bad fats with good fats - fish, nuts and seeds, canola oil, olive oil.   We have ordered labs or studies at this visit. It can take up to 1-2 weeks for results and processing. IF results require follow up or explanation, we will call you with instructions. Clinically stable results will be released to your [facility]. If you have not heard from [place] or cannot find your results in [facility] in 2 weeks please contact our office at [phone].  If you are not yet signed up for [facility], please consider signing up.   Health Maintenance, Male A healthy lifestyle and preventive care is important for your health and wellness. Ask your health care provider about what schedule of regular examinations is right for you. What should I know about weight and diet? Eat a Healthy Diet  Eat plenty of vegetables, fruits, whole grains, low-fat dairy products, and lean protein.  Do not eat a lot of foods high in solid fats, added sugars, or salt.  Maintain a Healthy Weight Regular exercise can help you achieve or maintain a healthy weight. You should:  Do at least 150 minutes of exercise each week. The exercise should increase your heart rate and make you sweat (moderate-intensity exercise).  Do strength-training exercises at least twice a week.  Watch Your Levels of Cholesterol and Blood Lipids  Have your blood tested for lipids and cholesterol every 5 years starting at 29 years of age. If you are at high risk for heart disease, you should start having your blood  tested when you are 29 years old. You may need to have your cholesterol levels checked more often if: ? Your lipid or cholesterol levels are high. ? You are older than 29 years of age. ? You are at high risk for heart disease.  What should I know about cancer screening? Many types of cancers can be detected early and may often be prevented. Lung Cancer  You should be screened every year for lung cancer if: ? You are a current smoker who has smoked for at least 30 years. ? You are a former smoker who has quit within the past 15 years.  Talk to your health care provider about your screening options, when you should start screening, and how often you should be screened.  Colorectal Cancer  Routine colorectal cancer screening usually begins at 29 years of age and should be repeated every 5-10 years until you are 29 years old. You may need to be screened more often if early forms of precancerous polyps or small growths are found. Your health care provider may recommend screening at an earlier age if you have risk factors for colon cancer.  Your health care provider may recommend using home test kits to check for hidden blood in the stool.  A small camera at the end of a tube can be used to examine your colon (sigmoidoscopy or colonoscopy). This checks for the earliest forms of colorectal cancer.  Prostate and Testicular Cancer  Depending on your age and overall health,  your health care provider may do certain tests to screen for prostate and testicular cancer.  Talk to your health care provider about any symptoms or concerns you have about testicular or prostate cancer.  Skin Cancer  Check your skin from head to toe regularly.  Tell your health care provider about any new moles or changes in moles, especially if: ? There is a change in a mole's size, shape, or color. ? You have a mole that is larger than a pencil eraser.  Always use sunscreen. Apply sunscreen liberally and repeat  throughout the day.  Protect yourself by wearing long sleeves, pants, a wide-brimmed hat, and sunglasses when outside.  What should I know about heart disease, diabetes, and high blood pressure?  If you are 36-11 years of age, have your blood pressure checked every 3-5 years. If you are 64 years of age or older, have your blood pressure checked every year. You should have your blood pressure measured twice-once when you are at a hospital or clinic, and once when you are not at a hospital or clinic. Record the average of the two measurements. To check your blood pressure when you are not at a hospital or clinic, you can use: ? An automated blood pressure machine at a pharmacy. ? A home blood pressure monitor.  Talk to your health care provider about your target blood pressure.  If you are between 32-94 years old, ask your health care provider if you should take aspirin to prevent heart disease.  Have regular diabetes screenings by checking your fasting blood sugar level. ? If you are at a normal weight and have a low risk for diabetes, have this test once every three years after the age of 42. ? If you are overweight and have a high risk for diabetes, consider being tested at a younger age or more often.  A one-time screening for abdominal aortic aneurysm (AAA) by ultrasound is recommended for men aged 16-75 years who are current or former smokers. What should I know about preventing infection? Hepatitis B If you have a higher risk for hepatitis B, you should be screened for this virus. Talk with your health care provider to find out if you are at risk for hepatitis B infection. Hepatitis C Blood testing is recommended for:  Everyone born from [birth year] through 1963.  Anyone with known risk factors for hepatitis C.  Sexually Transmitted Diseases (STDs)  You should be screened each year for STDs including gonorrhea and chlamydia if: ? You are sexually active and are younger than 29 years of  age. ? You are older than 29 years of age and your health care provider tells you that you are at risk for this type of infection. ? Your sexual activity has changed since you were last screened and you are at an increased risk for chlamydia or gonorrhea. Ask your health care provider if you are at risk.  Talk with your health care provider about whether you are at high risk of being infected with HIV. Your health care provider may recommend a prescription medicine to help prevent HIV infection.  What else can I do?  Schedule regular health, dental, and eye exams.  Stay current with your vaccines (immunizations).  Do not use any tobacco products, such as cigarettes, chewing tobacco, and e-cigarettes. If you need help quitting, ask your health care provider.  Limit alcohol intake to no more than 2 drinks per day. One drink equals 12 ounces of beer, 5 ounces  of wine, or 1 ounces of hard liquor.  Do not use street drugs.  Do not share needles.  Ask your health care provider for help if you need support or information about quitting drugs.  Tell your health care provider if you often feel depressed.  Tell your health care provider if you have ever been abused or do not feel safe at home. This information is not intended to replace advice given to you by your health care provider. Make sure you discuss any questions you have with your health care provider. Document Released: 09/28/2007 Document Revised: 11/29/2015 Document Reviewed: 01/03/2015 Elsevier Interactive Patient Education  [employer].

## 2016-12-18 DIAGNOSIS — J3081 Allergic rhinitis due to animal (cat) (dog) hair and dander: Secondary | ICD-10-CM | POA: Diagnosis not present

## 2016-12-18 DIAGNOSIS — J3089 Other allergic rhinitis: Secondary | ICD-10-CM | POA: Diagnosis not present

## 2016-12-20 DIAGNOSIS — J3089 Other allergic rhinitis: Secondary | ICD-10-CM | POA: Diagnosis not present

## 2016-12-20 DIAGNOSIS — J3081 Allergic rhinitis due to animal (cat) (dog) hair and dander: Secondary | ICD-10-CM | POA: Diagnosis not present

## 2016-12-23 DIAGNOSIS — J3089 Other allergic rhinitis: Secondary | ICD-10-CM | POA: Diagnosis not present

## 2016-12-23 DIAGNOSIS — J3081 Allergic rhinitis due to animal (cat) (dog) hair and dander: Secondary | ICD-10-CM | POA: Diagnosis not present

## 2016-12-26 DIAGNOSIS — J3081 Allergic rhinitis due to animal (cat) (dog) hair and dander: Secondary | ICD-10-CM | POA: Diagnosis not present

## 2016-12-26 DIAGNOSIS — J3089 Other allergic rhinitis: Secondary | ICD-10-CM | POA: Diagnosis not present

## 2016-12-31 DIAGNOSIS — J3081 Allergic rhinitis due to animal (cat) (dog) hair and dander: Secondary | ICD-10-CM | POA: Diagnosis not present

## 2016-12-31 DIAGNOSIS — J3089 Other allergic rhinitis: Secondary | ICD-10-CM | POA: Diagnosis not present

## 2017-01-02 ENCOUNTER — Encounter: Payer: Self-pay | Admitting: Family Medicine

## 2017-01-08 DIAGNOSIS — J3081 Allergic rhinitis due to animal (cat) (dog) hair and dander: Secondary | ICD-10-CM | POA: Diagnosis not present

## 2017-01-08 DIAGNOSIS — J3089 Other allergic rhinitis: Secondary | ICD-10-CM | POA: Diagnosis not present

## 2017-01-17 DIAGNOSIS — J3081 Allergic rhinitis due to animal (cat) (dog) hair and dander: Secondary | ICD-10-CM | POA: Diagnosis not present

## 2017-01-17 DIAGNOSIS — J3089 Other allergic rhinitis: Secondary | ICD-10-CM | POA: Diagnosis not present

## 2017-01-21 DIAGNOSIS — J3081 Allergic rhinitis due to animal (cat) (dog) hair and dander: Secondary | ICD-10-CM | POA: Diagnosis not present

## 2017-01-22 DIAGNOSIS — J3089 Other allergic rhinitis: Secondary | ICD-10-CM | POA: Diagnosis not present

## 2017-01-22 DIAGNOSIS — J3081 Allergic rhinitis due to animal (cat) (dog) hair and dander: Secondary | ICD-10-CM | POA: Diagnosis not present

## 2017-01-24 ENCOUNTER — Encounter: Payer: Self-pay | Admitting: Family Medicine

## 2017-01-31 DIAGNOSIS — J301 Allergic rhinitis due to pollen: Secondary | ICD-10-CM | POA: Diagnosis not present

## 2017-01-31 DIAGNOSIS — J3081 Allergic rhinitis due to animal (cat) (dog) hair and dander: Secondary | ICD-10-CM | POA: Diagnosis not present

## 2017-02-04 DIAGNOSIS — J3081 Allergic rhinitis due to animal (cat) (dog) hair and dander: Secondary | ICD-10-CM | POA: Diagnosis not present

## 2017-02-04 DIAGNOSIS — J3089 Other allergic rhinitis: Secondary | ICD-10-CM | POA: Diagnosis not present

## 2017-02-10 DIAGNOSIS — J3081 Allergic rhinitis due to animal (cat) (dog) hair and dander: Secondary | ICD-10-CM | POA: Diagnosis not present

## 2017-02-10 DIAGNOSIS — J3089 Other allergic rhinitis: Secondary | ICD-10-CM | POA: Diagnosis not present

## 2017-02-18 DIAGNOSIS — J3081 Allergic rhinitis due to animal (cat) (dog) hair and dander: Secondary | ICD-10-CM | POA: Diagnosis not present

## 2017-02-18 DIAGNOSIS — J3089 Other allergic rhinitis: Secondary | ICD-10-CM | POA: Diagnosis not present

## 2017-03-05 DIAGNOSIS — J3089 Other allergic rhinitis: Secondary | ICD-10-CM | POA: Diagnosis not present

## 2017-03-05 DIAGNOSIS — J3081 Allergic rhinitis due to animal (cat) (dog) hair and dander: Secondary | ICD-10-CM | POA: Diagnosis not present

## 2017-03-10 DIAGNOSIS — J3089 Other allergic rhinitis: Secondary | ICD-10-CM | POA: Diagnosis not present

## 2017-03-10 DIAGNOSIS — J3081 Allergic rhinitis due to animal (cat) (dog) hair and dander: Secondary | ICD-10-CM | POA: Diagnosis not present

## 2017-03-18 DIAGNOSIS — J3089 Other allergic rhinitis: Secondary | ICD-10-CM | POA: Diagnosis not present

## 2017-03-18 DIAGNOSIS — J301 Allergic rhinitis due to pollen: Secondary | ICD-10-CM | POA: Diagnosis not present

## 2017-03-18 DIAGNOSIS — J3081 Allergic rhinitis due to animal (cat) (dog) hair and dander: Secondary | ICD-10-CM | POA: Diagnosis not present

## 2017-03-20 MED FILL — PANTOPRAZOLE SOD DR 20 MG T: 20 | 90 days supply | Qty: 90 | Fill #0

## 2017-04-01 ENCOUNTER — Encounter: Payer: Self-pay | Admitting: Family Medicine

## 2017-04-01 NOTE — Telephone Encounter (Signed)
I left a message for the pt to call the office for an appt in regards to the symptoms he mentioned.

## 2017-04-03 ENCOUNTER — Ambulatory Visit (INDEPENDENT_AMBULATORY_CARE_PROVIDER_SITE_OTHER): Payer: 59 | Admitting: Family Medicine

## 2017-04-03 ENCOUNTER — Encounter: Payer: Self-pay | Admitting: Family Medicine

## 2017-04-03 VITALS — BP 130/88 | HR 69 | Temp 98.6°F | Ht 73.5 in | Wt 292.6 lb

## 2017-04-03 DIAGNOSIS — J3081 Allergic rhinitis due to animal (cat) (dog) hair and dander: Secondary | ICD-10-CM | POA: Diagnosis not present

## 2017-04-03 DIAGNOSIS — H6981 Other specified disorders of Eustachian tube, right ear: Secondary | ICD-10-CM | POA: Diagnosis not present

## 2017-04-03 DIAGNOSIS — J069 Acute upper respiratory infection, unspecified: Secondary | ICD-10-CM

## 2017-04-03 DIAGNOSIS — J3089 Other allergic rhinitis: Secondary | ICD-10-CM | POA: Diagnosis not present

## 2017-04-03 NOTE — Progress Notes (Signed)
HPI:  Acute visit for respiratory illness: -started:5 days ago -symptoms:nasal congestion, sore throat, R ear pain, improving today -denies:fever, SOB, NVD, tooth pain, body aches, wheezing -has tried: ibuprofen -sick contacts/travel/risks: no reported flu, strep or tick exposure  ROS: See pertinent positives and negatives per HPI.  Past Medical History:  Diagnosis Date  . Allergic rhinitis 08/30/2013  . Asthma   . GERD (gastroesophageal reflux disease)   . Hx of retinal detachment - [facility] 08/30/2013  . OSA (obstructive sleep apnea) 02/14/2015  . Varicocele, evaluated by urologist in the past 08/30/2013    Past Surgical History:  Procedure Laterality Date  . EYE SURGERY     bilateral retinal detachment  . TESTICLE SURGERY    . TONSILLECTOMY AND ADENOIDECTOMY      Family History  Problem Relation Age of Onset  . Hyperlipidemia Father   . Hypertension Paternal Grandmother   . Heart disease Paternal Grandmother   . Heart attack Paternal Grandmother   . Heart disease Maternal Grandmother   . Heart disease Maternal Grandfather   . Heart disease Paternal Grandfather   . Heart attack Paternal Grandfather     Social History   Socioeconomic History  . Marital status: Married    Spouse name: None  . Number of children: None  . Years of education: None  . Highest education level: None  Social Needs  . Financial resource strain: None  . Food insecurity - worry: None  . Food insecurity - inability: None  . Transportation needs - medical: None  . Transportation needs - non-medical: None  Occupational History  . None  Tobacco Use  . Smoking status: Never Smoker  . Smokeless tobacco: Never Used  Substance and Sexual Activity  . Alcohol use: Yes    Alcohol/week: 0.0 oz    Comment: occ 1-2 drinks  . Drug use: None  . Sexual activity: None  Other Topics Concern  . None  Social History Narrative   Work or School: Writer      Home  Situation: lives alone      Spiritual Beliefs: Christian      Lifestyle: regular CV exercise; working on a healthy diet              Current Outpatient Medications:  .  albuterol (PROAIR HFA) 108 (90 BASE) MCG/ACT inhaler, Inhale 2 puffs into the lungs every 4 (four) hours as needed for wheezing or shortness of breath., Disp: 1 Inhaler, Rfl: 0 .  pantoprazole (PROTONIX) 20 MG tablet, Take 1 tablet (20 mg total) by mouth daily as needed., Disp: 30 tablet, Rfl: 5  EXAM:  Vitals:   04/03/17 1633  BP: 130/88  Pulse: 69  Temp: 98.6 F (37 C)  SpO2: 98%    Body mass index is 38.08 kg/m.  GENERAL: vitals reviewed and listed above, alert, oriented, appears well hydrated and in no acute distress  HEENT: atraumatic, conjunttiva clear, no obvious abnormalities on inspection of external nose and ears, normal appearance of ear canals and TMs, clear nasal congestion, mild post oropharyngeal erythema with PND, no tonsillar edema or exudate, no sinus TTP  NECK: no obvious masses on inspection  LUNGS: clear to auscultation bilaterally, no wheezes, rales or rhonchi, good air movement,   CV: HRRR, no peripheral edema  MS: moves all extremities without noticeable abnormality  PSYCH: pleasant and cooperative, no obvious depression or anxiety  ASSESSMENT AND PLAN:  Discussed the following assessment and plan:  Viral upper respiratory tract  infection  Dysfunction of right eustachian tube  -given HPI and exam findings today, a serious infection or illness is unlikely. We discussed potential etiologies, with VURI being most likely, and advised supportive care and monitoring. We discussed treatment side effects, likely course, antibiotic misuse, transmission, and signs of developing a serious illness -of course, we advised to return or notify a doctor immediately if symptoms worsen or persist or new concerns arise.    Patient Instructions  INSTRUCTIONS FOR UPPER RESPIRATORY  INFECTION:  -plenty of rest and fluids  -nasal saline wash 2-3 times daily (use prepackaged nasal saline or bottled/distilled water if making your own)   -can use AFRIN nasal spray for drainage and nasal congestion - but do NOT use longer then 3-4 days  -can use tylenol (in no history of liver disease) or ibuprofen (if no history of kidney disease, bowel bleeding or significant heart disease) as directed for aches and sorethroat  -in the winter time, using a humidifier at night is helpful (please follow cleaning instructions)  -if you are taking a cough medication - use only as directed, may also try a teaspoon of honey to coat the throat and throat lozenges. If given a cough medication with codeine or hydrocodone or other narcotic please be advised that this contains a strong and  potentially addicting medication. Please follow instructions carefully, take as little as possible and only use AS NEEDED for severe cough. Discuss potential side effects with your pharmacy. Please do not drive or operate machinery while taking these types of medications. Please do not take other sedating medications, drugs or alcohol while taking this medication without discussing with your doctor.  -for sore throat, salt water gargles can help  -follow up if you have fevers, facial pain, tooth pain, difficulty breathing or are worsening or symptoms persist longer then expected  Upper Respiratory Infection, Adult An upper respiratory infection (URI) is also known as the common cold. It is often caused by a type of germ (virus). Colds are easily spread (contagious). You can pass it to others by kissing, coughing, sneezing, or drinking out of the same glass. Usually, you get better in 1 to 3  weeks.  However, the cough can last for even longer. HOME CARE   Only take medicine as told by your doctor. Follow instructions provided above.  Drink enough water and fluids to keep your pee (urine) clear or pale  yellow.  Get plenty of rest.  Return to work when your temperature is < 100 for 24 hours or as told by your doctor. You may use a face mask and wash your hands to stop your cold from spreading. GET HELP RIGHT AWAY IF:   After the first few days, you feel you are getting worse.  You have questions about your medicine.  You have chills, shortness of breath, or red spit (mucus).  You have pain in the face for more then 1-2 days, especially when you bend forward.  You have a fever, puffy (swollen) neck, pain when you swallow, or white spots in the back of your throat.  You have a bad headache, ear pain, sinus pain, or chest pain.  You have a high-pitched whistling sound when you breathe in and out (wheezing).  You cough up blood.  You have sore muscles or a stiff neck. MAKE SURE YOU:   Understand these instructions.  Will watch your condition.  Will get help right away if you are not doing well or get worse. Document Released:  09/18/2007 Document Revised: 06/24/2011 Document Reviewed: 07/07/2013 [facility] Patient Information 2015 [place], [place]. This information is not intended to replace advice given to you by your health care provider. Make sure you discuss any questions you have with your health care provider.    Colin Benton R., DO

## 2017-04-03 NOTE — Patient Instructions (Signed)

## 2017-04-11 DIAGNOSIS — J3081 Allergic rhinitis due to animal (cat) (dog) hair and dander: Secondary | ICD-10-CM | POA: Diagnosis not present

## 2017-04-11 DIAGNOSIS — J3089 Other allergic rhinitis: Secondary | ICD-10-CM | POA: Diagnosis not present

## 2017-04-21 DIAGNOSIS — J3089 Other allergic rhinitis: Secondary | ICD-10-CM | POA: Diagnosis not present

## 2017-04-21 DIAGNOSIS — J301 Allergic rhinitis due to pollen: Secondary | ICD-10-CM | POA: Diagnosis not present

## 2017-04-21 DIAGNOSIS — J3081 Allergic rhinitis due to animal (cat) (dog) hair and dander: Secondary | ICD-10-CM | POA: Diagnosis not present

## 2017-04-24 ENCOUNTER — Encounter: Payer: Self-pay | Admitting: Family Medicine

## 2017-04-29 DIAGNOSIS — J3089 Other allergic rhinitis: Secondary | ICD-10-CM | POA: Diagnosis not present

## 2017-04-29 DIAGNOSIS — J3081 Allergic rhinitis due to animal (cat) (dog) hair and dander: Secondary | ICD-10-CM | POA: Diagnosis not present

## 2017-04-29 DIAGNOSIS — J301 Allergic rhinitis due to pollen: Secondary | ICD-10-CM | POA: Diagnosis not present

## 2017-04-29 MED FILL — VENTOLIN HFA 90 MCG INHALER: 108 (90 BAS | 25 days supply | Qty: 18 | Fill #0

## 2017-05-06 DIAGNOSIS — J3089 Other allergic rhinitis: Secondary | ICD-10-CM | POA: Diagnosis not present

## 2017-05-06 DIAGNOSIS — J3081 Allergic rhinitis due to animal (cat) (dog) hair and dander: Secondary | ICD-10-CM | POA: Diagnosis not present

## 2017-05-12 ENCOUNTER — Ambulatory Visit: Payer: 59 | Admitting: Pulmonary Disease

## 2017-05-13 DIAGNOSIS — J3089 Other allergic rhinitis: Secondary | ICD-10-CM | POA: Diagnosis not present

## 2017-05-13 DIAGNOSIS — J301 Allergic rhinitis due to pollen: Secondary | ICD-10-CM | POA: Diagnosis not present

## 2017-05-13 DIAGNOSIS — J3081 Allergic rhinitis due to animal (cat) (dog) hair and dander: Secondary | ICD-10-CM | POA: Diagnosis not present

## 2017-05-15 DIAGNOSIS — J3089 Other allergic rhinitis: Secondary | ICD-10-CM | POA: Diagnosis not present

## 2017-05-15 DIAGNOSIS — J3081 Allergic rhinitis due to animal (cat) (dog) hair and dander: Secondary | ICD-10-CM | POA: Diagnosis not present

## 2017-05-19 DIAGNOSIS — J4530 Mild persistent asthma, uncomplicated: Secondary | ICD-10-CM | POA: Diagnosis not present

## 2017-05-19 DIAGNOSIS — K219 Gastro-esophageal reflux disease without esophagitis: Secondary | ICD-10-CM | POA: Diagnosis not present

## 2017-05-19 DIAGNOSIS — J3089 Other allergic rhinitis: Secondary | ICD-10-CM | POA: Diagnosis not present

## 2017-05-19 DIAGNOSIS — J3081 Allergic rhinitis due to animal (cat) (dog) hair and dander: Secondary | ICD-10-CM | POA: Diagnosis not present

## 2017-05-21 ENCOUNTER — Encounter: Payer: Self-pay | Admitting: Family Medicine

## 2017-05-21 ENCOUNTER — Telehealth: Payer: Self-pay | Admitting: Family Medicine

## 2017-05-21 MED ORDER — OSELTAMIVIR PHOSPHATE 75 MG PO CAPS: 75.0000 mg | ORAL_CAPSULE | Freq: Every day | ORAL | 0 refills | Status: DC

## 2017-05-21 NOTE — Telephone Encounter (Signed)
Rx sent and Left message on machine for patient. 

## 2017-05-21 NOTE — Telephone Encounter (Signed)
Copied from [place] [phone]. Topic: Quick Communication - See Telephone Encounter >> May 21, 2017 11:18 AM [place], Laban Emperor wrote: CRM for notification. See Telephone encounter for:  Pt states that his wife was just Dx with the flu and he would like to know if Dr. Maudie Mercury can call him in something for prevention.  [place], [place] - [place] [phone] (Phone) [phone] (Fax)   05/21/17.

## 2017-05-21 NOTE — Telephone Encounter (Signed)
tamiflu 75 mg po qd for 10 days.

## 2017-05-21 NOTE — Telephone Encounter (Signed)
Can you ask Dr. Elease Hashimoto about this message?

## 2017-05-21 NOTE — Telephone Encounter (Signed)
Telephone encounter sent to Dr. Elease Hashimoto in PCP's absence.

## 2017-05-22 MED FILL — OSELTAMIVIR PHOSPHATE 75 MG: 75 | 10 days supply | Qty: 10 | Fill #0

## 2017-05-26 DIAGNOSIS — J301 Allergic rhinitis due to pollen: Secondary | ICD-10-CM | POA: Diagnosis not present

## 2017-05-26 DIAGNOSIS — J3089 Other allergic rhinitis: Secondary | ICD-10-CM | POA: Diagnosis not present

## 2017-05-26 DIAGNOSIS — J3081 Allergic rhinitis due to animal (cat) (dog) hair and dander: Secondary | ICD-10-CM | POA: Diagnosis not present

## 2017-05-26 DIAGNOSIS — H5213 Myopia, bilateral: Secondary | ICD-10-CM | POA: Diagnosis not present

## 2017-05-26 DIAGNOSIS — H52223 Regular astigmatism, bilateral: Secondary | ICD-10-CM | POA: Diagnosis not present

## 2017-06-02 DIAGNOSIS — J3081 Allergic rhinitis due to animal (cat) (dog) hair and dander: Secondary | ICD-10-CM | POA: Diagnosis not present

## 2017-06-02 DIAGNOSIS — J3089 Other allergic rhinitis: Secondary | ICD-10-CM | POA: Diagnosis not present

## 2017-06-04 DIAGNOSIS — J301 Allergic rhinitis due to pollen: Secondary | ICD-10-CM | POA: Diagnosis not present

## 2017-06-04 DIAGNOSIS — J3081 Allergic rhinitis due to animal (cat) (dog) hair and dander: Secondary | ICD-10-CM | POA: Diagnosis not present

## 2017-06-04 DIAGNOSIS — J3089 Other allergic rhinitis: Secondary | ICD-10-CM | POA: Diagnosis not present

## 2017-06-09 ENCOUNTER — Ambulatory Visit: Payer: 59 | Admitting: Pulmonary Disease

## 2017-06-09 ENCOUNTER — Encounter: Payer: Self-pay | Admitting: Pulmonary Disease

## 2017-06-09 VITALS — BP 122/90 | HR 64 | Ht 75.0 in | Wt 295.0 lb

## 2017-06-09 DIAGNOSIS — J3089 Other allergic rhinitis: Secondary | ICD-10-CM | POA: Diagnosis not present

## 2017-06-09 DIAGNOSIS — J301 Allergic rhinitis due to pollen: Secondary | ICD-10-CM | POA: Diagnosis not present

## 2017-06-09 DIAGNOSIS — G4733 Obstructive sleep apnea (adult) (pediatric): Secondary | ICD-10-CM | POA: Diagnosis not present

## 2017-06-09 DIAGNOSIS — J3081 Allergic rhinitis due to animal (cat) (dog) hair and dander: Secondary | ICD-10-CM | POA: Diagnosis not present

## 2017-06-09 NOTE — Progress Notes (Signed)
[facility]  Chief Complaint  Patient presents with  . Follow-up    Pt is doing well overall with cpap, had some questions regarding mask    Vital signs: BP 122/90 (BP Location: Left Arm, Cuff Size: Normal)   Pulse 64   Ht 6\' 3"  (1.905 m)   Wt 295 lb (133.8 kg)   SpO2 99%   BMI 36.87 kg/m   History of Present Illness: Sean Allen is a 32 y.o. male obstructive sleep apnea.  He uses CPAP nightly.  Has full face mask.  His wife says he snores occasionally, but not bad.  He is sleeping well and feels rested.  No changes in health since last time I saw him.  Physical Exam:  General - pleasant Eyes - pupils reactive ENT - no sinus tenderness, no oral exudate, no LAN Cardiac - regular, no murmur Chest - no wheeze, rales Abd - soft, non tender Ext - no edema Skin - no rashes Neuro - normal strength Psych - normal mood   Assessment/Plan:  Obstructive sleep apnea. - he is compliant with CPAP and reports benefit from therapy - continue auto CPAP   Patient Instructions  Follow up in 1 year    Chesley Mires, MD [place] 06/09/2017, 12:40 PM Pager:  [phone]  Flow Sheet  Sleep tests: HST 02/10/15 >> AHI 44.4, SaO2 low 76%. Auto CPAP 03/07/17 to 06/04/17 >> used on 88 of 90 nights with average 6 hrs 45 min.  Average AHI 2.8 with median CPAP 10 and 95 th percentile CPAP 14 cm H2O.  Past Medical History: He  has a past medical history of Allergic rhinitis (08/30/2013), Asthma, GERD (gastroesophageal reflux disease), Hx of retinal detachment - [facility] (08/30/2013), OSA (obstructive sleep apnea) (02/14/2015), and Varicocele, evaluated by urologist in the past (08/30/2013).  Past Surgical History: He  has a past surgical history that includes Tonsillectomy and adenoidectomy; Eye surgery; and Testicle surgery.  Family History: His family history includes Heart attack in his paternal  grandfather and paternal grandmother; Heart disease in his maternal grandfather, maternal grandmother, paternal grandfather, and paternal grandmother; Hyperlipidemia in his father; Hypertension in his paternal grandmother.  Social History: He  reports that  has never smoked. he has never used smokeless tobacco. He reports that he drinks alcohol. He reports that he does not use drugs.  Medications: Allergies as of 06/09/2017      Reactions   Ciprofloxacin    GI upset   Dust Mite Extract    Mold Extract [trichophyton]    Other    Cats       Medication List        Accurate as of 06/09/17 12:40 PM. Always use your most recent med list.          albuterol 108 (90 Base) MCG/ACT inhaler Commonly known as:  PROAIR HFA Inhale 2 puffs into the lungs every 4 (four) hours as needed for wheezing or shortness of breath.   pantoprazole 20 MG tablet Commonly known as:  PROTONIX Take 1 tablet (20 mg total) by mouth daily as needed.

## 2017-06-09 NOTE — Patient Instructions (Signed)
Follow up in 1 year.

## 2017-06-11 DIAGNOSIS — J3081 Allergic rhinitis due to animal (cat) (dog) hair and dander: Secondary | ICD-10-CM | POA: Diagnosis not present

## 2017-06-11 MED FILL — PANTOPRAZOLE SOD DR 20 MG T: 20 | 90 days supply | Qty: 90 | Fill #1

## 2017-06-12 DIAGNOSIS — G4733 Obstructive sleep apnea (adult) (pediatric): Secondary | ICD-10-CM | POA: Diagnosis not present

## 2017-06-17 DIAGNOSIS — J3081 Allergic rhinitis due to animal (cat) (dog) hair and dander: Secondary | ICD-10-CM | POA: Diagnosis not present

## 2017-06-17 DIAGNOSIS — J3089 Other allergic rhinitis: Secondary | ICD-10-CM | POA: Diagnosis not present

## 2017-06-24 DIAGNOSIS — J3081 Allergic rhinitis due to animal (cat) (dog) hair and dander: Secondary | ICD-10-CM | POA: Diagnosis not present

## 2017-06-24 DIAGNOSIS — J301 Allergic rhinitis due to pollen: Secondary | ICD-10-CM | POA: Diagnosis not present

## 2017-07-01 DIAGNOSIS — J3089 Other allergic rhinitis: Secondary | ICD-10-CM | POA: Diagnosis not present

## 2017-07-01 DIAGNOSIS — J3081 Allergic rhinitis due to animal (cat) (dog) hair and dander: Secondary | ICD-10-CM | POA: Diagnosis not present

## 2017-07-14 DIAGNOSIS — J3081 Allergic rhinitis due to animal (cat) (dog) hair and dander: Secondary | ICD-10-CM | POA: Diagnosis not present

## 2017-07-14 DIAGNOSIS — J3089 Other allergic rhinitis: Secondary | ICD-10-CM | POA: Diagnosis not present

## 2017-07-21 DIAGNOSIS — J3089 Other allergic rhinitis: Secondary | ICD-10-CM | POA: Diagnosis not present

## 2017-07-21 DIAGNOSIS — J3081 Allergic rhinitis due to animal (cat) (dog) hair and dander: Secondary | ICD-10-CM | POA: Diagnosis not present

## 2017-07-31 DIAGNOSIS — J3081 Allergic rhinitis due to animal (cat) (dog) hair and dander: Secondary | ICD-10-CM | POA: Diagnosis not present

## 2017-07-31 DIAGNOSIS — J3089 Other allergic rhinitis: Secondary | ICD-10-CM | POA: Diagnosis not present

## 2017-08-02 NOTE — Progress Notes (Signed)
  HPI:  Using dictation device. Unfortunately this device frequently misinterprets words/phrases.  Acute visit to discussed how to prepare for conception. He and his wife are thinking of conceiving. He wonders about cystic fibrosis screening - no hx cystic fibrosis or carrier in his family. Distant relative and wife's family with cystic fibrosis. Wife plans to get tested. Wife has ob. They plan to work on healthier lifestyle.  ROS: See pertinent positives and negatives per HPI.  Past Medical History:  Diagnosis Date  . Allergic rhinitis 08/30/2013  . Asthma   . GERD (gastroesophageal reflux disease)   . Hx of retinal detachment - [facility] 08/30/2013  . OSA (obstructive sleep apnea) 02/14/2015  . Varicocele, evaluated by urologist in the past 08/30/2013    Past Surgical History:  Procedure Laterality Date  . EYE SURGERY     bilateral retinal detachment  . TESTICLE SURGERY    . TONSILLECTOMY AND ADENOIDECTOMY      Family History  Problem Relation Age of Onset  . Hyperlipidemia Father   . Hypertension Paternal Grandmother   . Heart disease Paternal Grandmother   . Heart attack Paternal Grandmother   . Heart disease Maternal Grandmother   . Heart disease Maternal Grandfather   . Heart disease Paternal Grandfather   . Heart attack Paternal Grandfather     SOCIAL HX: see hpi   Current Outpatient Medications:  .  albuterol (PROAIR HFA) 108 (90 BASE) MCG/ACT inhaler, Inhale 2 puffs into the lungs every 4 (four) hours as needed for wheezing or shortness of breath., Disp: 1 Inhaler, Rfl: 0 .  pantoprazole (PROTONIX) 20 MG tablet, Take 1 tablet (20 mg total) by mouth daily as needed., Disp: 30 tablet, Rfl: 5  EXAM:  Vitals:   08/04/17 1141  BP: 124/72  Pulse: 60  Temp: 98.2 F (36.8 C)    Body mass index is 37.51 kg/m.  GENERAL: vitals reviewed and listed above, alert, oriented, appears well hydrated and in no acute distress  HEENT: atraumatic,  conjunttiva clear, no obvious abnormalities on inspection of external nose and ears  NECK: no obvious masses on inspection  LUNGS: clear to auscultation bilaterally, no wheezes, rales or rhonchi, good air movement  CV: HRRR, no peripheral edema  MS: moves all extremities without noticeable abnormality  PSYCH: pleasant and cooperative, no obvious depression or anxiety  ASSESSMENT AND PLAN:  Discussed the following assessment and plan:  Genetic screening  BMI 37.0-37.9, adult -More than 50% of over 25 minutes spent in total in caring for this patient was spent face-to-face with the patient, counseling and/or coordinating care.  -discussed cystic fibrosis carrier/disease rates, screening, etc -opted to have wife screen with ob and then he would need to screen if she is positive -advised healthy diet, regular exercise and wt reductions -follow up here as needed -Patient advised to return or notify a doctor immediately if symptoms worsen or persist or new concerns arise.  There are no Patient Instructions on file for this visit.  Lucretia Kern, DO

## 2017-08-04 ENCOUNTER — Ambulatory Visit: Payer: 59 | Admitting: Family Medicine

## 2017-08-04 ENCOUNTER — Encounter: Payer: Self-pay | Admitting: Family Medicine

## 2017-08-04 VITALS — BP 124/72 | HR 60 | Temp 98.2°F | Ht 75.0 in | Wt 300.1 lb

## 2017-08-04 DIAGNOSIS — J3081 Allergic rhinitis due to animal (cat) (dog) hair and dander: Secondary | ICD-10-CM | POA: Diagnosis not present

## 2017-08-04 DIAGNOSIS — Z6837 Body mass index (BMI) 37.0-37.9, adult: Secondary | ICD-10-CM

## 2017-08-04 DIAGNOSIS — J301 Allergic rhinitis due to pollen: Secondary | ICD-10-CM | POA: Diagnosis not present

## 2017-08-04 DIAGNOSIS — Z1379 Encounter for other screening for genetic and chromosomal anomalies: Secondary | ICD-10-CM | POA: Diagnosis not present

## 2017-08-04 DIAGNOSIS — J3089 Other allergic rhinitis: Secondary | ICD-10-CM | POA: Diagnosis not present

## 2017-08-11 DIAGNOSIS — J3081 Allergic rhinitis due to animal (cat) (dog) hair and dander: Secondary | ICD-10-CM | POA: Diagnosis not present

## 2017-08-11 DIAGNOSIS — J3089 Other allergic rhinitis: Secondary | ICD-10-CM | POA: Diagnosis not present

## 2017-08-19 DIAGNOSIS — J3081 Allergic rhinitis due to animal (cat) (dog) hair and dander: Secondary | ICD-10-CM | POA: Diagnosis not present

## 2017-08-19 DIAGNOSIS — J3089 Other allergic rhinitis: Secondary | ICD-10-CM | POA: Diagnosis not present

## 2017-08-26 DIAGNOSIS — J3081 Allergic rhinitis due to animal (cat) (dog) hair and dander: Secondary | ICD-10-CM | POA: Diagnosis not present

## 2017-08-26 DIAGNOSIS — J3089 Other allergic rhinitis: Secondary | ICD-10-CM | POA: Diagnosis not present

## 2017-09-01 DIAGNOSIS — J3089 Other allergic rhinitis: Secondary | ICD-10-CM | POA: Diagnosis not present

## 2017-09-01 DIAGNOSIS — J3081 Allergic rhinitis due to animal (cat) (dog) hair and dander: Secondary | ICD-10-CM | POA: Diagnosis not present

## 2017-09-10 DIAGNOSIS — J3089 Other allergic rhinitis: Secondary | ICD-10-CM | POA: Diagnosis not present

## 2017-09-16 DIAGNOSIS — G4733 Obstructive sleep apnea (adult) (pediatric): Secondary | ICD-10-CM | POA: Diagnosis not present

## 2017-09-16 DIAGNOSIS — J3089 Other allergic rhinitis: Secondary | ICD-10-CM | POA: Diagnosis not present

## 2017-09-16 DIAGNOSIS — J3081 Allergic rhinitis due to animal (cat) (dog) hair and dander: Secondary | ICD-10-CM | POA: Diagnosis not present

## 2017-09-23 ENCOUNTER — Encounter: Payer: Self-pay | Admitting: Family Medicine

## 2017-09-23 ENCOUNTER — Telehealth: Payer: Self-pay | Admitting: Family Medicine

## 2017-09-23 MED ORDER — PANTOPRAZOLE SODIUM 20 MG PO TBEC: 20.0000 mg | DELAYED_RELEASE_TABLET | Freq: Every day | ORAL | 3 refills | Status: DC | PRN

## 2017-09-23 MED FILL — PANTOPRAZOLE SOD DR 20 MG T: 20 | 30 days supply | Qty: 30 | Fill #0

## 2017-09-23 NOTE — Telephone Encounter (Signed)
Rx done-see Mychart message. 

## 2017-09-23 NOTE — Telephone Encounter (Signed)
Patient called requesting refill for pantoprazole (PROTONIX) 20 MG tablet.  He has an appointment in August, but would like some medication to get him through until then.  CB#[phone].

## 2017-09-23 NOTE — Telephone Encounter (Signed)
Copied from [place] [id number]. Topic: Quick Communication - Rx Refill/Question >> Sep 23, 2017  9:09 AM Boyd Kerbs wrote: Medication:   pantoprazole (PROTONIX) 20 MG tablet Pt was in April and has physical in August  Has the patient contacted their pharmacy? Yes.   (Agent: If no, request that the patient contact the pharmacy for the refill.) (Agent: If yes, when and what did the pharmacy advise?)  Preferred Pharmacy (with phone number or street name):   [place], [place] - [place] [address] Phone: [phone] Fax: [phone]    Agent: Please be advised that RX refills may take up to 3 business days. We ask that you follow-up with your pharmacy.

## 2017-09-25 DIAGNOSIS — J3081 Allergic rhinitis due to animal (cat) (dog) hair and dander: Secondary | ICD-10-CM | POA: Diagnosis not present

## 2017-09-25 DIAGNOSIS — J3089 Other allergic rhinitis: Secondary | ICD-10-CM | POA: Diagnosis not present

## 2017-09-25 DIAGNOSIS — J301 Allergic rhinitis due to pollen: Secondary | ICD-10-CM | POA: Diagnosis not present

## 2017-09-29 DIAGNOSIS — J3089 Other allergic rhinitis: Secondary | ICD-10-CM | POA: Diagnosis not present

## 2017-09-29 DIAGNOSIS — J3081 Allergic rhinitis due to animal (cat) (dog) hair and dander: Secondary | ICD-10-CM | POA: Diagnosis not present

## 2017-09-29 DIAGNOSIS — J301 Allergic rhinitis due to pollen: Secondary | ICD-10-CM | POA: Diagnosis not present

## 2017-10-10 DIAGNOSIS — J3089 Other allergic rhinitis: Secondary | ICD-10-CM | POA: Diagnosis not present

## 2017-10-10 DIAGNOSIS — J3081 Allergic rhinitis due to animal (cat) (dog) hair and dander: Secondary | ICD-10-CM | POA: Diagnosis not present

## 2017-10-13 DIAGNOSIS — J3089 Other allergic rhinitis: Secondary | ICD-10-CM | POA: Diagnosis not present

## 2017-10-13 DIAGNOSIS — J3081 Allergic rhinitis due to animal (cat) (dog) hair and dander: Secondary | ICD-10-CM | POA: Diagnosis not present

## 2017-10-15 DIAGNOSIS — J3089 Other allergic rhinitis: Secondary | ICD-10-CM | POA: Diagnosis not present

## 2017-10-15 DIAGNOSIS — J3081 Allergic rhinitis due to animal (cat) (dog) hair and dander: Secondary | ICD-10-CM | POA: Diagnosis not present

## 2017-10-20 DIAGNOSIS — J3081 Allergic rhinitis due to animal (cat) (dog) hair and dander: Secondary | ICD-10-CM | POA: Diagnosis not present

## 2017-10-22 DIAGNOSIS — J3089 Other allergic rhinitis: Secondary | ICD-10-CM | POA: Diagnosis not present

## 2017-10-22 DIAGNOSIS — J3081 Allergic rhinitis due to animal (cat) (dog) hair and dander: Secondary | ICD-10-CM | POA: Diagnosis not present

## 2017-11-03 ENCOUNTER — Telehealth: Payer: 59 | Admitting: Family

## 2017-11-03 DIAGNOSIS — J3081 Allergic rhinitis due to animal (cat) (dog) hair and dander: Secondary | ICD-10-CM | POA: Diagnosis not present

## 2017-11-03 DIAGNOSIS — H109 Unspecified conjunctivitis: Secondary | ICD-10-CM

## 2017-11-03 DIAGNOSIS — J3089 Other allergic rhinitis: Secondary | ICD-10-CM | POA: Diagnosis not present

## 2017-11-03 MED ORDER — POLYMYXIN B-TRIMETHOPRIM 10000-0.1 UNIT/ML-% OP SOLN: 2.0000 [drp] | Freq: Four times a day (QID) | OPHTHALMIC | 0 refills | Status: DC

## 2017-11-03 MED FILL — PANTOPRAZOLE SOD DR 20 MG T: 20 | 90 days supply | Qty: 90 | Fill #1

## 2017-11-03 MED FILL — POLYMYXIN B/TMP EYE DROPS: 10000-0.1 | 25 days supply | Qty: 10 | Fill #0

## 2017-11-03 NOTE — Progress Notes (Signed)
Thank you for the details you included in the comment boxes. Those details are very helpful in determining the best course of treatment for you and help us to provide the best care.  We are sorry that you are not feeling well.  Here is how we plan to help!  Based on what you have shared with me it looks like you have conjunctivitis.  Conjunctivitis is a common inflammatory or infectious condition of the eye that is often referred to as "pink eye".  In most cases it is contagious (viral or bacterial). However, not all conjunctivitis requires antibiotics (ex. Allergic).  We have made appropriate suggestions for you based upon your presentation.  I have prescribed Polytrim Ophthalmic drops 2 drops 4 times a day times 5 days  Pink eye can be highly contagious.  It is typically spread through direct contact with secretions, or contaminated objects or surfaces that one may have touched.  Strict handwashing is suggested with soap and water is urged.  If not available, use alcohol based had sanitizer.  Avoid unnecessary touching of the eye.  If you wear contact lenses, you will need to refrain from wearing them until you see no white discharge from the eye for at least 24 hours after being on medication.  You should see symptom improvement in 1-2 days after starting the medication regimen.  Call us if symptoms are not improved in 1-2 days.  Home Care:  Wash your hands often!  Do not wear your contacts until you complete your treatment plan.  Avoid sharing towels, bed linen, personal items with a person who has pink eye.  See attention for anyone in your home with similar symptoms.  Get Help Right Away If:  Your symptoms do not improve.  You develop blurred or loss of vision.  Your symptoms worsen (increased discharge, pain or redness)  Your e-visit answers were reviewed by a board certified advanced clinical practitioner to complete your personal care plan.  Depending on the condition, your plan  could have included both over the counter or prescription medications.  If there is a problem please reply  once you have received a response from your provider.  Your safety is important to us.  If you have drug allergies check your prescription carefully.    You can use MyChart to ask questions about today's visit, request a non-urgent call back, or ask for a work or school excuse for 24 hours related to this e-Visit. If it has been greater than 24 hours you will need to follow up with your provider, or enter a new e-Visit to address those concerns.   You will get an e-mail in the next two days asking about your experience.  I hope that your e-visit has been valuable and will speed your recovery. Thank you for using e-visits.      

## 2017-11-11 DIAGNOSIS — J3081 Allergic rhinitis due to animal (cat) (dog) hair and dander: Secondary | ICD-10-CM | POA: Diagnosis not present

## 2017-11-11 DIAGNOSIS — J3089 Other allergic rhinitis: Secondary | ICD-10-CM | POA: Diagnosis not present

## 2017-11-25 DIAGNOSIS — J3081 Allergic rhinitis due to animal (cat) (dog) hair and dander: Secondary | ICD-10-CM | POA: Diagnosis not present

## 2017-11-25 DIAGNOSIS — J3089 Other allergic rhinitis: Secondary | ICD-10-CM | POA: Diagnosis not present

## 2017-11-27 DIAGNOSIS — J301 Allergic rhinitis due to pollen: Secondary | ICD-10-CM | POA: Diagnosis not present

## 2017-11-27 DIAGNOSIS — J3089 Other allergic rhinitis: Secondary | ICD-10-CM | POA: Diagnosis not present

## 2017-12-01 DIAGNOSIS — J3081 Allergic rhinitis due to animal (cat) (dog) hair and dander: Secondary | ICD-10-CM | POA: Diagnosis not present

## 2017-12-01 DIAGNOSIS — J3089 Other allergic rhinitis: Secondary | ICD-10-CM | POA: Diagnosis not present

## 2017-12-04 DIAGNOSIS — J3089 Other allergic rhinitis: Secondary | ICD-10-CM | POA: Diagnosis not present

## 2017-12-04 DIAGNOSIS — J3081 Allergic rhinitis due to animal (cat) (dog) hair and dander: Secondary | ICD-10-CM | POA: Diagnosis not present

## 2017-12-10 NOTE — Progress Notes (Signed)
HPI:  Using dictation device. Unfortunately this device frequently misinterprets words/phrases.  Here for CPE: Due for flu vaccine.  -Concerns and/or follow up today:  Sean Allen is a pleasant 32 yo with a PMH obesity, OSA, asthma, AR, GERD and retinal detatchment here for his annual exam.  Reports he uses his albuterol rarely, usually before exercise and humid weather.  Continues to use his CPAP for OSA and feels this has helped dramatically.  Reports his acid reflux has improved with improvement in diet and exercise.  He uses his proton pump inhibitor as needed, usually when he eats spicy he foods.  Reports his diet has been better overall, sometimes not as good on vacation.  He is trying to get to the gym 3 to 4 days/week for aerobic exercise.  He has several new concerns today.  First of all, he feels like he sweats more often than he used to, though admits he has always been somebody who sweats.  However sometimes he will notice sweating of axillary region or the feet even when he is at rest.  Denies any fevers, unexplained weight loss, malaise, bowel issues, urinary issues or other symptoms.  He also has noticed some bad breath on and off.  He has not consulted his dentist about this yet, but agrees to do so.  He does have postnasal drip chronically.  Has a history of reflux and seasonal allergies.  Allergies improved with allergy shots, but continues to have regular postnasal drip.  He also has a skin lesion on the left wrist.  It he got this pressure washing, thinks it may have been a bug bite.  Has had it for about 1 week.  It is improving, but has not resolved.  He also wonders about when he should see the urologist and his wife should see the obstetrician if they are having issues conceiving.  -Diabetes and Dyslipidemia Screening: Fasting for labs, cholesterol checked ago in good -Hx of HTN: no -Vaccines: UTD except for flu shot, he plans to get flu shot at work in October -sexual  activity: yes, male partner, no new partners -wants STI testing, Hep C screening (if born 10-1961): no -FH colon or prstate ca: see FH Last colon cancer screening: n/a  Last prostate ca screening: n/a -Alcohol, Tobacco, drug use: see social history  Review of Systems - no fevers, unintentional weight loss, vision loss, hearing loss, chest pain, sob, hemoptysis, melena, hematochezia, hematuria, genital discharge, changing or concerning skin lesions, bleeding, bruising, loc, thoughts of self harm or SI  Past Medical History:  Diagnosis Date  . Allergic rhinitis 08/30/2013  . Asthma   . GERD (gastroesophageal reflux disease)   . Hx of retinal detachment - [facility] 08/30/2013  . OSA (obstructive sleep apnea) 02/14/2015  . Varicocele, evaluated by urologist in the past 08/30/2013    Past Surgical History:  Procedure Laterality Date  . EYE SURGERY     bilateral retinal detachment  . TESTICLE SURGERY    . TONSILLECTOMY AND ADENOIDECTOMY      Family History  Problem Relation Age of Onset  . Hyperlipidemia Father   . Hypertension Paternal Grandmother   . Heart disease Paternal Grandmother   . Heart attack Paternal Grandmother   . Heart disease Maternal Grandmother   . Heart disease Maternal Grandfather   . Heart disease Paternal Grandfather   . Heart attack Paternal Grandfather     Social History   Socioeconomic History  . Marital status: Married  Spouse name: Not on file  . Number of children: Not on file  . Years of education: Not on file  . Highest education level: Not on file  Occupational History  . Not on file  Social Needs  . Financial resource strain: Not on file  . Food insecurity:    Worry: Not on file    Inability: Not on file  . Transportation needs:    Medical: Not on file    Non-medical: Not on file  Tobacco Use  . Smoking status: Never Smoker  . Smokeless tobacco: Never Used  Substance and Sexual Activity  . Alcohol use: Yes     Alcohol/week: 0.0 standard drinks    Comment: occ 1-2 drinks  . Drug use: No  . Sexual activity: Not on file  Lifestyle  . Physical activity:    Days per week: Not on file    Minutes per session: Not on file  . Stress: Not on file  Relationships  . Social connections:    Talks on phone: Not on file    Gets together: Not on file    Attends religious service: Not on file    Active member of club or organization: Not on file    Attends meetings of clubs or organizations: Not on file    Relationship status: Not on file  Other Topics Concern  . Not on file  Social History Narrative   Work or School: Writer      Home Situation: lives alone      Spiritual Beliefs: Christian      Lifestyle: regular CV exercise; working on a healthy diet              Current Outpatient Medications:  .  albuterol (PROAIR HFA) 108 (90 BASE) MCG/ACT inhaler, Inhale 2 puffs into the lungs every 4 (four) hours as needed for wheezing or shortness of breath., Disp: 1 Inhaler, Rfl: 0 .  pantoprazole (PROTONIX) 20 MG tablet, Take 1 tablet (20 mg total) by mouth daily as needed., Disp: 30 tablet, Rfl: 3  EXAM:  Vitals:   12/11/17 0737  BP: 102/70  Pulse: 71  Temp: 97.9 F (36.6 C)  TempSrc: Oral  Weight: 291 lb 14.4 oz (132.4 kg)  Height: 6' 3" (1.905 m)    Estimated body mass index is 36.48 kg/m as calculated from the following:   Height as of this encounter: 6' 3" (1.905 m).   Weight as of this encounter: 291 lb 14.4 oz (132.4 kg).  GENERAL: vitals reviewed and listed below, alert, oriented, appears well hydrated and in no acute distress  HEENT: head atraumatic, PERRLA, normal appearance of eyes, ears, nose and mouth. moist mucus membranes.  NECK: supple, no masses or lymphadenopathy  LUNGS: clear to auscultation bilaterally, no rales, rhonchi or wheeze  CV: HRRR, no peripheral edema or cyanosis, normal pedal pulses  ABDOMEN: bowel sounds normal, soft, non tender to palpation,  no masses, no rebound or guarding  GU: deferred, sees urology  SKIN: no rash or abnormal lesions except for small healing papule on the left wrist, no signs of infection  MS: normal gait, moves all extremities normally  NEURO: normal gait, speech and thought processing grossly intact, muscle tone grossly intact throughout  PSYCH: normal affect, pleasant and cooperative  ASSESSMENT AND PLAN:  Discussed the following assessment and plan:  PREVENTIVE EXAM: -Discussed and advised all [place] preventive services health task force level A and B recommendations for age, sex and risks. -Advised at  least 150 minutes of exercise per week and a healthy diet with avoidance of (less then 1 serving per week) processed foods, white starches, red meat, fast foods and sweets and consisting of: * 5-9 servings of fresh fruits and vegetables (not corn or potatoes) *nuts and seeds, beans *olives and olive oil *lean meats such as fish and white chicken  *whole grains -labs, studies and vaccines per orders this encounter  2. Screening for depression -See epic screening  3. OSA (obstructive sleep apnea) -Continue CPAP and healthy lifestyle for weight reduction  4. Uncomplicated asthma, unspecified asthma severity, unspecified whether persistent -Albuterol as needed  5. Allergic rhinitis, unspecified seasonality, unspecified trigger -Discussed treatment options, the postnasal drip may be contributing to the bad breath -He does not like to use nasal sprays -discussed antihistamine, nasal sprays, Singulair as options  6. Halitosis -Advised to see dentist -Also discussed postnasal drip may be contributing -which could be from silent reflux or allergies  7. Gastroesophageal reflux disease, esophagitis presence not specified -He wants to try PPI for 2 weeks to see if this helps with the postnasal drip and bad breath  8. Sweating abnormality -Likely normal for him, but we will check some labs -There are  various treatment options if this becomes an issue for him -he wanted to check thyroid - Basic metabolic panel - CBC - TSH   9. Skin lesion -Is to be healing normally, advised follow-up if not resolved in the next few weeks  10. Obesity: -Recommend healthy diet, regular exercise and weight reduction  Advised he and his wife see urology/obstetrician if issues conceiving over 6-12 months.  Follow up as needed for persistent or new issues  Patient Instructions  BEFORE YOU LEAVE: -labs -follow up: yearly and as needed  Get your flu shot in October  Eat a health low sugar diet, get at least 150 minutes of aerobic exercise daily.  We have ordered labs or studies at this visit. It can take up to 1-2 weeks for results and processing. IF results require follow up or explanation, we will call you with instructions. Clinically stable results will be released to your [facility]. If you have not heard from [place] or cannot find your results in [facility] in 2 weeks please contact our office at [phone].  If you are not yet signed up for [facility], please consider signing up.   Preventive Care 18-39 Years, Male Preventive care refers to lifestyle choices and visits with your health care provider that can promote health and wellness. What does preventive care include?  A yearly physical exam. This is also called an annual well check.  Dental exams once or twice a year.  Routine eye exams. Ask your health care provider how often you should have your eyes checked.  Personal lifestyle choices, including: ? Daily care of your teeth and gums. ? Regular physical activity. ? Eating a healthy diet. ? Avoiding tobacco and drug use. ? Limiting alcohol use. ? Practicing safe sex. What happens during an annual well check? The services and screenings done by your health care provider during your annual well check will depend on your age, overall health, lifestyle risk factors, and family history of  disease. Counseling Your health care provider may ask you questions about your:  Alcohol use.  Tobacco use.  Drug use.  Emotional well-being.  Home and relationship well-being.  Sexual activity.  Eating habits.  Work and work Statistician.  Screening You may have the following tests or measurements:  Height,  weight, and BMI.  Blood pressure.  Lipid and cholesterol levels. These may be checked every 5 years starting at age 62.  Diabetes screening. This is done by checking your blood sugar (glucose) after you have not eaten for a while (fasting).  Skin check.  Hepatitis C blood test.  Hepatitis B blood test.  Sexually transmitted disease (STD) testing.  Discuss your test results, treatment options, and if necessary, the need for more tests with your health care provider. Vaccines Your health care provider may recommend certain vaccines, such as:  Influenza vaccine. This is recommended every year.  Tetanus, diphtheria, and acellular pertussis (Tdap, Td) vaccine. You may need a Td booster every 10 years.  Varicella vaccine. You may need this if you have not been vaccinated.  HPV vaccine. If you are 31 or younger, you may need three doses over 6 months.  Measles, mumps, and rubella (MMR) vaccine. You may need at least one dose of MMR.You may also need a second dose.  Pneumococcal 13-valent conjugate (PCV13) vaccine. You may need this if you have certain conditions and have not been vaccinated.  Pneumococcal polysaccharide (PPSV23) vaccine. You may need one or two doses if you smoke cigarettes or if you have certain conditions.  Meningococcal vaccine. One dose is recommended if you are age 70-21 years and a first-year college student living in a residence hall, or if you have one of several medical conditions. You may also need additional booster doses.  Hepatitis A vaccine. You may need this if you have certain conditions or if you travel or work in places where  you may be exposed to hepatitis A.  Hepatitis B vaccine. You may need this if you have certain conditions or if you travel or work in places where you may be exposed to hepatitis B.  Haemophilus influenzae type b (Hib) vaccine. You may need this if you have certain risk factors.  Talk to your health care provider about which screenings and vaccines you need and how often you need them. This information is not intended to replace advice given to you by your health care provider. Make sure you discuss any questions you have with your health care provider. Document Released: 05/28/2001 Document Revised: 12/20/2015 Document Reviewed: 01/31/2015 Elsevier Interactive Patient Education  2018 [employer].          No follow-ups on file.   Lucretia Kern, DO

## 2017-12-11 ENCOUNTER — Encounter: Payer: Self-pay | Admitting: Family Medicine

## 2017-12-11 ENCOUNTER — Ambulatory Visit (INDEPENDENT_AMBULATORY_CARE_PROVIDER_SITE_OTHER): Payer: 59 | Admitting: Family Medicine

## 2017-12-11 VITALS — BP 102/70 | HR 71 | Temp 97.9°F | Ht 75.0 in | Wt 291.9 lb

## 2017-12-11 DIAGNOSIS — L749 Eccrine sweat disorder, unspecified: Secondary | ICD-10-CM | POA: Diagnosis not present

## 2017-12-11 DIAGNOSIS — Z0000 Encounter for general adult medical examination without abnormal findings: Secondary | ICD-10-CM

## 2017-12-11 DIAGNOSIS — J45909 Unspecified asthma, uncomplicated: Secondary | ICD-10-CM | POA: Diagnosis not present

## 2017-12-11 DIAGNOSIS — K219 Gastro-esophageal reflux disease without esophagitis: Secondary | ICD-10-CM

## 2017-12-11 DIAGNOSIS — L989 Disorder of the skin and subcutaneous tissue, unspecified: Secondary | ICD-10-CM | POA: Diagnosis not present

## 2017-12-11 DIAGNOSIS — Z1331 Encounter for screening for depression: Secondary | ICD-10-CM | POA: Diagnosis not present

## 2017-12-11 DIAGNOSIS — J309 Allergic rhinitis, unspecified: Secondary | ICD-10-CM | POA: Diagnosis not present

## 2017-12-11 DIAGNOSIS — J3081 Allergic rhinitis due to animal (cat) (dog) hair and dander: Secondary | ICD-10-CM | POA: Diagnosis not present

## 2017-12-11 DIAGNOSIS — G4733 Obstructive sleep apnea (adult) (pediatric): Secondary | ICD-10-CM

## 2017-12-11 DIAGNOSIS — R196 Halitosis: Secondary | ICD-10-CM | POA: Diagnosis not present

## 2017-12-11 DIAGNOSIS — J3089 Other allergic rhinitis: Secondary | ICD-10-CM | POA: Diagnosis not present

## 2017-12-11 LAB — BASIC METABOLIC PANEL
BUN: 15 mg/dL (ref 6–23)
CHLORIDE: 103 meq/L (ref 96–112)
CO2: 30 mEq/L (ref 19–32)
CREATININE: 1.31 mg/dL (ref 0.40–1.50)
Calcium: 9.7 mg/dL (ref 8.4–10.5)
GFR: 68.05 mL/min (ref >60.00)
GLUCOSE: 89 mg/dL (ref 70–99)
POTASSIUM: 4.8 meq/L (ref 3.5–5.1)
Sodium: 139 mEq/L (ref 135–145)

## 2017-12-11 LAB — CBC
HEMATOCRIT: 42.3 % (ref 39.0–52.0)
HEMOGLOBIN: 14.3 g/dL (ref 13.0–17.0)
MCHC: 33.9 g/dL (ref 30.0–36.0)
MCV: 91.1 fl (ref 78.0–100.0)
Platelets: 227 10*3/uL (ref 150.0–400.0)
RBC: 4.64 Mil/uL (ref 4.22–5.81)
RDW: 13.4 % (ref 11.5–15.5)
WBC: 4.8 10*3/uL (ref 4.0–10.5)

## 2017-12-11 LAB — TSH: TSH: 1.77 u[IU]/mL (ref 0.35–4.50)

## 2017-12-11 NOTE — Patient Instructions (Signed)
BEFORE YOU LEAVE: -labs -follow up: yearly and as needed  Get your flu shot in October  Eat a health low sugar diet, get at least 150 minutes of aerobic exercise daily.  We have ordered labs or studies at this visit. It can take up to 1-2 weeks for results and processing. IF results require follow up or explanation, we will call you with instructions. Clinically stable results will be released to your [facility]. If you have not heard from [place] or cannot find your results in [facility] in 2 weeks please contact our office at [phone].  If you are not yet signed up for [facility], please consider signing up.   Preventive Care 18-39 Years, Male Preventive care refers to lifestyle choices and visits with your health care provider that can promote health and wellness. What does preventive care include?  A yearly physical exam. This is also called an annual well check.  Dental exams once or twice a year.  Routine eye exams. Ask your health care provider how often you should have your eyes checked.  Personal lifestyle choices, including: ? Daily care of your teeth and gums. ? Regular physical activity. ? Eating a healthy diet. ? Avoiding tobacco and drug use. ? Limiting alcohol use. ? Practicing safe sex. What happens during an annual well check? The services and screenings done by your health care provider during your annual well check will depend on your age, overall health, lifestyle risk factors, and family history of disease. Counseling Your health care provider may ask you questions about your:  Alcohol use.  Tobacco use.  Drug use.  Emotional well-being.  Home and relationship well-being.  Sexual activity.  Eating habits.  Work and work Statistician.  Screening You may have the following tests or measurements:  Height, weight, and BMI.  Blood pressure.  Lipid and cholesterol levels. These may be checked every 5 years starting at age 49.  Diabetes screening. This is  done by checking your blood sugar (glucose) after you have not eaten for a while (fasting).  Skin check.  Hepatitis C blood test.  Hepatitis B blood test.  Sexually transmitted disease (STD) testing.  Discuss your test results, treatment options, and if necessary, the need for more tests with your health care provider. Vaccines Your health care provider may recommend certain vaccines, such as:  Influenza vaccine. This is recommended every year.  Tetanus, diphtheria, and acellular pertussis (Tdap, Td) vaccine. You may need a Td booster every 10 years.  Varicella vaccine. You may need this if you have not been vaccinated.  HPV vaccine. If you are 53 or younger, you may need three doses over 6 months.  Measles, mumps, and rubella (MMR) vaccine. You may need at least one dose of MMR.You may also need a second dose.  Pneumococcal 13-valent conjugate (PCV13) vaccine. You may need this if you have certain conditions and have not been vaccinated.  Pneumococcal polysaccharide (PPSV23) vaccine. You may need one or two doses if you smoke cigarettes or if you have certain conditions.  Meningococcal vaccine. One dose is recommended if you are age 10-21 years and a first-year college student living in a residence hall, or if you have one of several medical conditions. You may also need additional booster doses.  Hepatitis A vaccine. You may need this if you have certain conditions or if you travel or work in places where you may be exposed to hepatitis A.  Hepatitis B vaccine. You may need this if you have certain  conditions or if you travel or work in places where you may be exposed to hepatitis B.  Haemophilus influenzae type b (Hib) vaccine. You may need this if you have certain risk factors.  Talk to your health care provider about which screenings and vaccines you need and how often you need them. This information is not intended to replace advice given to you by your health care  provider. Make sure you discuss any questions you have with your health care provider. Document Released: 05/28/2001 Document Revised: 12/20/2015 Document Reviewed: 01/31/2015 Elsevier Interactive Patient Education  [employer].

## 2017-12-18 DIAGNOSIS — J3089 Other allergic rhinitis: Secondary | ICD-10-CM | POA: Diagnosis not present

## 2017-12-18 DIAGNOSIS — J3081 Allergic rhinitis due to animal (cat) (dog) hair and dander: Secondary | ICD-10-CM | POA: Diagnosis not present

## 2017-12-22 DIAGNOSIS — G4733 Obstructive sleep apnea (adult) (pediatric): Secondary | ICD-10-CM | POA: Diagnosis not present

## 2017-12-25 DIAGNOSIS — J3089 Other allergic rhinitis: Secondary | ICD-10-CM | POA: Diagnosis not present

## 2017-12-25 DIAGNOSIS — J3081 Allergic rhinitis due to animal (cat) (dog) hair and dander: Secondary | ICD-10-CM | POA: Diagnosis not present

## 2017-12-29 DIAGNOSIS — J3081 Allergic rhinitis due to animal (cat) (dog) hair and dander: Secondary | ICD-10-CM | POA: Diagnosis not present

## 2017-12-29 DIAGNOSIS — J3089 Other allergic rhinitis: Secondary | ICD-10-CM | POA: Diagnosis not present

## 2018-01-01 DIAGNOSIS — J3081 Allergic rhinitis due to animal (cat) (dog) hair and dander: Secondary | ICD-10-CM | POA: Diagnosis not present

## 2018-01-01 DIAGNOSIS — J3089 Other allergic rhinitis: Secondary | ICD-10-CM | POA: Diagnosis not present

## 2018-01-05 DIAGNOSIS — J3089 Other allergic rhinitis: Secondary | ICD-10-CM | POA: Diagnosis not present

## 2018-01-05 DIAGNOSIS — J3081 Allergic rhinitis due to animal (cat) (dog) hair and dander: Secondary | ICD-10-CM | POA: Diagnosis not present

## 2018-01-08 DIAGNOSIS — J3089 Other allergic rhinitis: Secondary | ICD-10-CM | POA: Diagnosis not present

## 2018-01-08 DIAGNOSIS — J3081 Allergic rhinitis due to animal (cat) (dog) hair and dander: Secondary | ICD-10-CM | POA: Diagnosis not present

## 2018-01-13 DIAGNOSIS — J3081 Allergic rhinitis due to animal (cat) (dog) hair and dander: Secondary | ICD-10-CM | POA: Diagnosis not present

## 2018-01-13 DIAGNOSIS — J3089 Other allergic rhinitis: Secondary | ICD-10-CM | POA: Diagnosis not present

## 2018-01-20 ENCOUNTER — Encounter: Payer: Self-pay | Admitting: Family Medicine

## 2018-01-21 DIAGNOSIS — J3089 Other allergic rhinitis: Secondary | ICD-10-CM | POA: Diagnosis not present

## 2018-01-21 DIAGNOSIS — J3081 Allergic rhinitis due to animal (cat) (dog) hair and dander: Secondary | ICD-10-CM | POA: Diagnosis not present

## 2018-01-30 DIAGNOSIS — J3089 Other allergic rhinitis: Secondary | ICD-10-CM | POA: Diagnosis not present

## 2018-01-30 DIAGNOSIS — J3081 Allergic rhinitis due to animal (cat) (dog) hair and dander: Secondary | ICD-10-CM | POA: Diagnosis not present

## 2018-02-04 DIAGNOSIS — J3081 Allergic rhinitis due to animal (cat) (dog) hair and dander: Secondary | ICD-10-CM | POA: Diagnosis not present

## 2018-02-04 DIAGNOSIS — J3089 Other allergic rhinitis: Secondary | ICD-10-CM | POA: Diagnosis not present

## 2018-02-10 ENCOUNTER — Other Ambulatory Visit: Payer: Self-pay | Admitting: Family Medicine

## 2018-02-10 DIAGNOSIS — J3081 Allergic rhinitis due to animal (cat) (dog) hair and dander: Secondary | ICD-10-CM | POA: Diagnosis not present

## 2018-02-10 DIAGNOSIS — J3089 Other allergic rhinitis: Secondary | ICD-10-CM | POA: Diagnosis not present

## 2018-02-11 MED FILL — PANTOPRAZOLE SOD DR 20 MG T: 20 | 90 days supply | Qty: 90 | Fill #0

## 2018-02-17 DIAGNOSIS — J3081 Allergic rhinitis due to animal (cat) (dog) hair and dander: Secondary | ICD-10-CM | POA: Diagnosis not present

## 2018-02-17 DIAGNOSIS — J3089 Other allergic rhinitis: Secondary | ICD-10-CM | POA: Diagnosis not present

## 2018-02-27 DIAGNOSIS — J3089 Other allergic rhinitis: Secondary | ICD-10-CM | POA: Diagnosis not present

## 2018-02-27 DIAGNOSIS — J3081 Allergic rhinitis due to animal (cat) (dog) hair and dander: Secondary | ICD-10-CM | POA: Diagnosis not present

## 2018-03-02 DIAGNOSIS — J3089 Other allergic rhinitis: Secondary | ICD-10-CM | POA: Diagnosis not present

## 2018-03-02 DIAGNOSIS — J3081 Allergic rhinitis due to animal (cat) (dog) hair and dander: Secondary | ICD-10-CM | POA: Diagnosis not present

## 2018-03-10 DIAGNOSIS — J3089 Other allergic rhinitis: Secondary | ICD-10-CM | POA: Diagnosis not present

## 2018-03-10 DIAGNOSIS — J3081 Allergic rhinitis due to animal (cat) (dog) hair and dander: Secondary | ICD-10-CM | POA: Diagnosis not present

## 2018-03-16 DIAGNOSIS — J3089 Other allergic rhinitis: Secondary | ICD-10-CM | POA: Diagnosis not present

## 2018-03-16 DIAGNOSIS — J301 Allergic rhinitis due to pollen: Secondary | ICD-10-CM | POA: Diagnosis not present

## 2018-03-16 DIAGNOSIS — J3081 Allergic rhinitis due to animal (cat) (dog) hair and dander: Secondary | ICD-10-CM | POA: Diagnosis not present

## 2018-03-26 DIAGNOSIS — J3081 Allergic rhinitis due to animal (cat) (dog) hair and dander: Secondary | ICD-10-CM | POA: Diagnosis not present

## 2018-03-26 DIAGNOSIS — J3089 Other allergic rhinitis: Secondary | ICD-10-CM | POA: Diagnosis not present

## 2018-03-31 DIAGNOSIS — J3089 Other allergic rhinitis: Secondary | ICD-10-CM | POA: Diagnosis not present

## 2018-03-31 DIAGNOSIS — J3081 Allergic rhinitis due to animal (cat) (dog) hair and dander: Secondary | ICD-10-CM | POA: Diagnosis not present

## 2018-04-01 DIAGNOSIS — G4733 Obstructive sleep apnea (adult) (pediatric): Secondary | ICD-10-CM | POA: Diagnosis not present

## 2018-04-14 DIAGNOSIS — J301 Allergic rhinitis due to pollen: Secondary | ICD-10-CM | POA: Diagnosis not present

## 2018-04-14 DIAGNOSIS — J3089 Other allergic rhinitis: Secondary | ICD-10-CM | POA: Diagnosis not present

## 2018-04-14 DIAGNOSIS — J3081 Allergic rhinitis due to animal (cat) (dog) hair and dander: Secondary | ICD-10-CM | POA: Diagnosis not present

## 2018-04-23 DIAGNOSIS — J3089 Other allergic rhinitis: Secondary | ICD-10-CM | POA: Diagnosis not present

## 2018-04-23 DIAGNOSIS — J3081 Allergic rhinitis due to animal (cat) (dog) hair and dander: Secondary | ICD-10-CM | POA: Diagnosis not present

## 2018-04-28 DIAGNOSIS — J3089 Other allergic rhinitis: Secondary | ICD-10-CM | POA: Diagnosis not present

## 2018-04-28 DIAGNOSIS — J3081 Allergic rhinitis due to animal (cat) (dog) hair and dander: Secondary | ICD-10-CM | POA: Diagnosis not present

## 2018-05-05 DIAGNOSIS — J3089 Other allergic rhinitis: Secondary | ICD-10-CM | POA: Diagnosis not present

## 2018-05-05 DIAGNOSIS — J3081 Allergic rhinitis due to animal (cat) (dog) hair and dander: Secondary | ICD-10-CM | POA: Diagnosis not present

## 2018-05-11 DIAGNOSIS — J3089 Other allergic rhinitis: Secondary | ICD-10-CM | POA: Diagnosis not present

## 2018-05-11 DIAGNOSIS — J4520 Mild intermittent asthma, uncomplicated: Secondary | ICD-10-CM | POA: Diagnosis not present

## 2018-05-11 DIAGNOSIS — J3081 Allergic rhinitis due to animal (cat) (dog) hair and dander: Secondary | ICD-10-CM | POA: Diagnosis not present

## 2018-05-11 DIAGNOSIS — J301 Allergic rhinitis due to pollen: Secondary | ICD-10-CM | POA: Diagnosis not present

## 2018-05-11 DIAGNOSIS — K219 Gastro-esophageal reflux disease without esophagitis: Secondary | ICD-10-CM | POA: Diagnosis not present

## 2018-05-11 MED FILL — EPINEPHRINE 0.3 MG AUTO-INJ: 0.3 | 30 days supply | Qty: 2 | Fill #0

## 2018-05-11 MED FILL — VENTOLIN HFA 90 MCG INHALER: 108 (90 BAS | 25 days supply | Qty: 18 | Fill #0

## 2018-05-13 DIAGNOSIS — J3081 Allergic rhinitis due to animal (cat) (dog) hair and dander: Secondary | ICD-10-CM | POA: Diagnosis not present

## 2018-05-13 DIAGNOSIS — J3089 Other allergic rhinitis: Secondary | ICD-10-CM | POA: Diagnosis not present

## 2018-05-16 ENCOUNTER — Telehealth: Payer: 59 | Admitting: Physician Assistant

## 2018-05-16 ENCOUNTER — Encounter: Payer: Self-pay | Admitting: Family Medicine

## 2018-05-16 DIAGNOSIS — R399 Unspecified symptoms and signs involving the genitourinary system: Secondary | ICD-10-CM

## 2018-05-16 NOTE — Progress Notes (Signed)
Based on what you shared with me it looks like you have a serious condition that should be evaluated in a face to face office visit.  NOTE: If you entered your credit card information for this eVisit, you will not be charged. You may see a "hold" on your card for the $30 but that hold will drop off and you will not have a charge processed.  If you are having a true medical emergency please call 911.  If you need an urgent face to face visit, [place] has four urgent care centers for your convenience.  If you need care fast and have a high deductible or no insurance consider:   [email or web address] to reserve your spot online an avoid wait times  [facility] [address], [address] [place], [place] [address] 8 am to 8 pm Monday-Friday 10 am to 4 pm Saturday-Sunday *Across the street from [employer]  [facility]  [address] [place], [address] 8 am to 5 pm Monday-Friday * In the [facility]   The following sites will take your  insurance:  . [facility]  [phone] Get Driving Directions Find a Provider at this Location  [address] [place], [place] [address] . 10 am to 8 pm Monday-Friday . 12 pm to 8 pm Saturday-Sunday   . [facility]  [phone] Get Driving Directions Find a Provider at this Location  [address], [address] [place], [place] [address] . 8 am to 8 pm Monday-Friday . 9 am to 6 pm Saturday . 11 am to 6 pm Sunday   . [facility] [place]  [phone] Get Driving Directions  [address] [place], [place] [address] . 8 am to 8 pm Monday-Friday . 8 am to 4 pm Saturday-Sunday   Your e-visit answers were reviewed by a board certified advanced clinical practitioner to complete your personal care plan.  Thank you for using e-Visits.  

## 2018-05-18 ENCOUNTER — Encounter: Payer: Self-pay | Admitting: Family Medicine

## 2018-05-20 DIAGNOSIS — J3081 Allergic rhinitis due to animal (cat) (dog) hair and dander: Secondary | ICD-10-CM | POA: Diagnosis not present

## 2018-05-20 DIAGNOSIS — J301 Allergic rhinitis due to pollen: Secondary | ICD-10-CM | POA: Diagnosis not present

## 2018-05-20 DIAGNOSIS — J3089 Other allergic rhinitis: Secondary | ICD-10-CM | POA: Diagnosis not present

## 2018-05-22 DIAGNOSIS — J3089 Other allergic rhinitis: Secondary | ICD-10-CM | POA: Diagnosis not present

## 2018-05-22 DIAGNOSIS — J3081 Allergic rhinitis due to animal (cat) (dog) hair and dander: Secondary | ICD-10-CM | POA: Diagnosis not present

## 2018-05-25 MED FILL — PANTOPRAZOLE SOD DR 20 MG T: 20 | 90 days supply | Qty: 90 | Fill #1

## 2018-05-27 DIAGNOSIS — J3081 Allergic rhinitis due to animal (cat) (dog) hair and dander: Secondary | ICD-10-CM | POA: Diagnosis not present

## 2018-05-27 DIAGNOSIS — J3089 Other allergic rhinitis: Secondary | ICD-10-CM | POA: Diagnosis not present

## 2018-06-05 DIAGNOSIS — J301 Allergic rhinitis due to pollen: Secondary | ICD-10-CM | POA: Diagnosis not present

## 2018-06-05 DIAGNOSIS — J3089 Other allergic rhinitis: Secondary | ICD-10-CM | POA: Diagnosis not present

## 2018-06-05 DIAGNOSIS — J3081 Allergic rhinitis due to animal (cat) (dog) hair and dander: Secondary | ICD-10-CM | POA: Diagnosis not present

## 2018-06-09 DIAGNOSIS — J301 Allergic rhinitis due to pollen: Secondary | ICD-10-CM | POA: Diagnosis not present

## 2018-06-09 DIAGNOSIS — J3081 Allergic rhinitis due to animal (cat) (dog) hair and dander: Secondary | ICD-10-CM | POA: Diagnosis not present

## 2018-06-09 DIAGNOSIS — J3089 Other allergic rhinitis: Secondary | ICD-10-CM | POA: Diagnosis not present

## 2018-06-16 DIAGNOSIS — J3081 Allergic rhinitis due to animal (cat) (dog) hair and dander: Secondary | ICD-10-CM | POA: Diagnosis not present

## 2018-06-16 DIAGNOSIS — J3089 Other allergic rhinitis: Secondary | ICD-10-CM | POA: Diagnosis not present

## 2018-06-22 DIAGNOSIS — H5213 Myopia, bilateral: Secondary | ICD-10-CM | POA: Diagnosis not present

## 2018-06-22 DIAGNOSIS — H52223 Regular astigmatism, bilateral: Secondary | ICD-10-CM | POA: Diagnosis not present

## 2018-06-23 DIAGNOSIS — J3081 Allergic rhinitis due to animal (cat) (dog) hair and dander: Secondary | ICD-10-CM | POA: Diagnosis not present

## 2018-06-23 DIAGNOSIS — J3089 Other allergic rhinitis: Secondary | ICD-10-CM | POA: Diagnosis not present

## 2018-06-29 DIAGNOSIS — J3089 Other allergic rhinitis: Secondary | ICD-10-CM | POA: Diagnosis not present

## 2018-06-29 DIAGNOSIS — J3081 Allergic rhinitis due to animal (cat) (dog) hair and dander: Secondary | ICD-10-CM | POA: Diagnosis not present

## 2018-07-06 DIAGNOSIS — G4733 Obstructive sleep apnea (adult) (pediatric): Secondary | ICD-10-CM | POA: Diagnosis not present

## 2018-07-15 ENCOUNTER — Encounter: Payer: Self-pay | Admitting: Family Medicine

## 2018-07-15 DIAGNOSIS — J3089 Other allergic rhinitis: Secondary | ICD-10-CM | POA: Diagnosis not present

## 2018-07-15 DIAGNOSIS — J3081 Allergic rhinitis due to animal (cat) (dog) hair and dander: Secondary | ICD-10-CM | POA: Diagnosis not present

## 2018-07-22 DIAGNOSIS — J3089 Other allergic rhinitis: Secondary | ICD-10-CM | POA: Diagnosis not present

## 2018-07-22 DIAGNOSIS — J3081 Allergic rhinitis due to animal (cat) (dog) hair and dander: Secondary | ICD-10-CM | POA: Diagnosis not present

## 2018-07-29 DIAGNOSIS — J3089 Other allergic rhinitis: Secondary | ICD-10-CM | POA: Diagnosis not present

## 2018-07-29 DIAGNOSIS — J3081 Allergic rhinitis due to animal (cat) (dog) hair and dander: Secondary | ICD-10-CM | POA: Diagnosis not present

## 2018-08-03 ENCOUNTER — Ambulatory Visit: Payer: 59 | Admitting: Pulmonary Disease

## 2018-08-05 DIAGNOSIS — J3089 Other allergic rhinitis: Secondary | ICD-10-CM | POA: Diagnosis not present

## 2018-08-05 DIAGNOSIS — J301 Allergic rhinitis due to pollen: Secondary | ICD-10-CM | POA: Diagnosis not present

## 2018-08-13 DIAGNOSIS — J3081 Allergic rhinitis due to animal (cat) (dog) hair and dander: Secondary | ICD-10-CM | POA: Diagnosis not present

## 2018-08-13 DIAGNOSIS — J3089 Other allergic rhinitis: Secondary | ICD-10-CM | POA: Diagnosis not present

## 2018-08-19 DIAGNOSIS — J3081 Allergic rhinitis due to animal (cat) (dog) hair and dander: Secondary | ICD-10-CM | POA: Diagnosis not present

## 2018-08-19 DIAGNOSIS — J301 Allergic rhinitis due to pollen: Secondary | ICD-10-CM | POA: Diagnosis not present

## 2018-08-19 DIAGNOSIS — J3089 Other allergic rhinitis: Secondary | ICD-10-CM | POA: Diagnosis not present

## 2018-08-20 DIAGNOSIS — J3081 Allergic rhinitis due to animal (cat) (dog) hair and dander: Secondary | ICD-10-CM | POA: Diagnosis not present

## 2018-08-24 ENCOUNTER — Other Ambulatory Visit: Payer: Self-pay | Admitting: Family Medicine

## 2018-08-24 MED FILL — PANTOPRAZOLE SOD DR 20 MG T: 20 | 30 days supply | Qty: 30 | Fill #0

## 2018-08-26 DIAGNOSIS — J3081 Allergic rhinitis due to animal (cat) (dog) hair and dander: Secondary | ICD-10-CM | POA: Diagnosis not present

## 2018-08-26 DIAGNOSIS — J3089 Other allergic rhinitis: Secondary | ICD-10-CM | POA: Diagnosis not present

## 2018-08-28 ENCOUNTER — Other Ambulatory Visit: Payer: Self-pay

## 2018-08-28 ENCOUNTER — Encounter: Payer: Self-pay | Admitting: Family Medicine

## 2018-08-28 ENCOUNTER — Ambulatory Visit (INDEPENDENT_AMBULATORY_CARE_PROVIDER_SITE_OTHER): Payer: 59 | Admitting: Family Medicine

## 2018-08-28 DIAGNOSIS — Z13220 Encounter for screening for lipoid disorders: Secondary | ICD-10-CM

## 2018-08-28 DIAGNOSIS — J309 Allergic rhinitis, unspecified: Secondary | ICD-10-CM

## 2018-08-28 DIAGNOSIS — J4520 Mild intermittent asthma, uncomplicated: Secondary | ICD-10-CM

## 2018-08-28 DIAGNOSIS — K219 Gastro-esophageal reflux disease without esophagitis: Secondary | ICD-10-CM

## 2018-08-28 DIAGNOSIS — G4733 Obstructive sleep apnea (adult) (pediatric): Secondary | ICD-10-CM | POA: Diagnosis not present

## 2018-08-28 DIAGNOSIS — Z131 Encounter for screening for diabetes mellitus: Secondary | ICD-10-CM

## 2018-08-28 NOTE — Progress Notes (Signed)
Virtual Visit via Video Note  I connected with Sean Allen   on 08/28/18 at 11:30 AM EDT by a video enabled telemedicine application and verified that I am speaking with the correct person using two identifiers.  Location patient: home Location provider:work office Persons participating in the virtual visit: patient, provider  I discussed the limitations of evaluation and management by telemedicine and the availability of in person appointments. The patient expressed understanding and agreed to proceed.   Sean Allen DOB: 1985/04/21 Encounter date: 08/28/2018  This is a 33 y.o. male who presents to establish care. Chief Complaint  Patient presents with  . Establish Care    transfer from Dr Maudie Mercury    History of present illness: No specific concerns today.   Noted bad breath at last visit with Dr. Maudie Mercury. She recommended daily protonix instead of just prn - this has helped and he no longer states he has bad breath issue. He did also talk with dentist. If he eats "like he is supposed to" then symptoms are well controlled without (reflux, sour burps). Historically was on nexium, but this wasn't well covered. Tonsils and adenoids have been removed as child.   Asthma: follows with allergy/asthma - sees them regularly. Gets allergy shots weekly and then once monthly. Work well for him. [place] allergy/asthma. Albuterol inhaler use is better with weight loss. Uses just more when active. Rare use.   OSA: follows with sleep specialist. Working on weight reduction to help with this but dad is very thin and has sleep apnea. They are working on weight loss and plan to recheck after he loses weight. 292 lb now; 250 is goal. Was over 300 when started to loose weight. Was going to gym, but can't with COVID. Trying to eat well at home. Was doing crossfit. Low carb, no fried foods, no sugar. Not snacking, working on portion control. Monitoring calories with my fitness pal. Has really tried to work on this for  last few years. Doing 5% weight loss goal with wellness so really pushing to work on weight loss.   Varicocele - no issues with this. Has seen urology in past. Actually improved with weight loss. Worried about fertility issues but wife is pregnant.   Energy level is pretty good overall.   Past Medical History:  Diagnosis Date  . Allergic rhinitis 08/30/2013  . Asthma   . GERD (gastroesophageal reflux disease)   . Hx of retinal detachment - [facility] 08/30/2013   bilat  . OSA (obstructive sleep apnea) 02/14/2015  . Varicocele, evaluated by urologist in the past 08/30/2013   Past Surgical History:  Procedure Laterality Date  . EYE SURGERY     bilateral retinal detachment  . TESTICLE SURGERY Right   . TONSILLECTOMY AND ADENOIDECTOMY     Allergies  Allergen Reactions  . Ciprofloxacin     GI upset  . Dust Mite Extract   . Grass Extracts [Gramineae Pollens]   . Mold Extract [Trichophyton]   . Other     Cats    Current Meds  Medication Sig  . albuterol (PROAIR HFA) 108 (90 BASE) MCG/ACT inhaler Inhale 2 puffs into the lungs every 4 (four) hours as needed for wheezing or shortness of breath.  . pantoprazole (PROTONIX) 20 MG tablet TAKE 1 TABLET BY MOUTH DAILY AS NEEDED.   Social History   Tobacco Use  . Smoking status: Never Smoker  . Smokeless tobacco: Never Used  Substance Use Topics  . Alcohol use:  Yes    Alcohol/week: 0.0 standard drinks    Comment: occ 1-2 drinks   Family History  Problem Relation Age of Onset  . Hyperlipidemia Father   . Other Father        prostate enlargement  . Sleep apnea Father   . Lung disease Father        asbestosis  . Diabetes Father        prediabetes  . Hypertension Paternal Grandmother   . Heart disease Paternal Grandmother   . Heart attack Paternal Grandmother   . High Cholesterol Paternal Grandmother   . Arthritis Mother        neck, knees  . Bradycardia Mother   . Heart disease Maternal Grandmother   .  Lymphoma Maternal Grandmother   . Kidney failure Maternal Grandmother   . Heart disease Maternal Grandfather   . Kidney failure Maternal Grandfather   . Diabetes Maternal Grandfather   . Heart disease Paternal Grandfather   . Heart attack Paternal Grandfather   . Prostate cancer Paternal Grandfather   . Healthy Sister        fatty liver  . Prostate cancer Paternal Great-grandfather   . Diabetes Paternal Aunt      Review of Systems  Constitutional: Negative for chills, fatigue and fever.  Respiratory: Negative for cough, chest tightness, shortness of breath and wheezing.   Cardiovascular: Negative for chest pain, palpitations and leg swelling.    Objective:  There were no vitals taken for this visit.      BP Readings from Last 3 Encounters:  12/11/17 102/70  08/04/17 124/72  06/09/17 122/90   Wt Readings from Last 3 Encounters:  12/11/17 291 lb 14.4 oz (132.4 kg)  08/04/17 (!) 300 lb 1.6 oz (136.1 kg)  06/09/17 295 lb (133.8 kg)    EXAM:  GENERAL: alert, oriented, appears well and in no acute distress  HEENT: atraumatic, conjunctiva clear, no obvious abnormalities on inspection of external nose and ears  NECK: normal movements of the head and neck  LUNGS: on inspection no signs of respiratory distress, breathing rate appears normal, no obvious gross SOB, gasping or wheezing  CV: no obvious cyanosis  MS: moves all visible extremities without noticeable abnormality  PSYCH/NEURO: pleasant and cooperative, no obvious depression or anxiety, speech and thought processing grossly intact  SKIN: no obvious facial/neck abnormalities.  Assessment/Plan  1. Gastroesophageal reflux disease, esophagitis presence not specified Well controlled with current medication.  2. Allergic rhinitis, unspecified seasonality, unspecified trigger Well controlled with allergy injections.  3. OSA (obstructive sleep apnea) Following w sleep specialist; working on weight loss; compliant  with CPAP.  4. Mild intermittent asthma without complication Stable.  5. Lipid screening - Lipid panel; Future  6. Screening for diabetes mellitus - Comprehensive metabolic panel; Future    Return for physical exam - september.  I discussed the assessment and treatment plan with the patient. The patient was provided an opportunity to ask questions and all were answered. The patient agreed with the plan and demonstrated an understanding of the instructions.   The patient was advised to call back or seek an in-person evaluation if the symptoms worsen or if the condition fails to improve as anticipated.  I provided 30 minutes of non-face-to-face time during this encounter.   Micheline Rough, MD

## 2018-09-01 DIAGNOSIS — J3089 Other allergic rhinitis: Secondary | ICD-10-CM | POA: Diagnosis not present

## 2018-09-01 DIAGNOSIS — J3081 Allergic rhinitis due to animal (cat) (dog) hair and dander: Secondary | ICD-10-CM | POA: Diagnosis not present

## 2018-09-09 DIAGNOSIS — J3089 Other allergic rhinitis: Secondary | ICD-10-CM | POA: Diagnosis not present

## 2018-09-09 DIAGNOSIS — J3081 Allergic rhinitis due to animal (cat) (dog) hair and dander: Secondary | ICD-10-CM | POA: Diagnosis not present

## 2018-09-14 DIAGNOSIS — J3081 Allergic rhinitis due to animal (cat) (dog) hair and dander: Secondary | ICD-10-CM | POA: Diagnosis not present

## 2018-09-14 DIAGNOSIS — J3089 Other allergic rhinitis: Secondary | ICD-10-CM | POA: Diagnosis not present

## 2018-09-21 MED FILL — PANTOPRAZOLE SOD DR 20 MG T: 20 | 90 days supply | Qty: 90 | Fill #1

## 2018-09-22 DIAGNOSIS — J3089 Other allergic rhinitis: Secondary | ICD-10-CM | POA: Diagnosis not present

## 2018-09-22 DIAGNOSIS — J301 Allergic rhinitis due to pollen: Secondary | ICD-10-CM | POA: Diagnosis not present

## 2018-09-22 DIAGNOSIS — J3081 Allergic rhinitis due to animal (cat) (dog) hair and dander: Secondary | ICD-10-CM | POA: Diagnosis not present

## 2018-10-01 DIAGNOSIS — G4733 Obstructive sleep apnea (adult) (pediatric): Secondary | ICD-10-CM | POA: Diagnosis not present

## 2018-10-06 DIAGNOSIS — J301 Allergic rhinitis due to pollen: Secondary | ICD-10-CM | POA: Diagnosis not present

## 2018-10-06 DIAGNOSIS — J3081 Allergic rhinitis due to animal (cat) (dog) hair and dander: Secondary | ICD-10-CM | POA: Diagnosis not present

## 2018-10-14 DIAGNOSIS — J3081 Allergic rhinitis due to animal (cat) (dog) hair and dander: Secondary | ICD-10-CM | POA: Diagnosis not present

## 2018-10-14 DIAGNOSIS — J3089 Other allergic rhinitis: Secondary | ICD-10-CM | POA: Diagnosis not present

## 2018-10-23 DIAGNOSIS — J3089 Other allergic rhinitis: Secondary | ICD-10-CM | POA: Diagnosis not present

## 2018-10-23 DIAGNOSIS — J3081 Allergic rhinitis due to animal (cat) (dog) hair and dander: Secondary | ICD-10-CM | POA: Diagnosis not present

## 2018-10-26 DIAGNOSIS — J3089 Other allergic rhinitis: Secondary | ICD-10-CM | POA: Diagnosis not present

## 2018-10-26 DIAGNOSIS — J3081 Allergic rhinitis due to animal (cat) (dog) hair and dander: Secondary | ICD-10-CM | POA: Diagnosis not present

## 2018-10-28 DIAGNOSIS — J3081 Allergic rhinitis due to animal (cat) (dog) hair and dander: Secondary | ICD-10-CM | POA: Diagnosis not present

## 2018-10-28 DIAGNOSIS — J3089 Other allergic rhinitis: Secondary | ICD-10-CM | POA: Diagnosis not present

## 2018-11-04 DIAGNOSIS — J3089 Other allergic rhinitis: Secondary | ICD-10-CM | POA: Diagnosis not present

## 2018-11-04 DIAGNOSIS — J3081 Allergic rhinitis due to animal (cat) (dog) hair and dander: Secondary | ICD-10-CM | POA: Diagnosis not present

## 2018-11-10 ENCOUNTER — Ambulatory Visit: Payer: 59 | Admitting: Pulmonary Disease

## 2018-11-12 ENCOUNTER — Ambulatory Visit: Payer: 59 | Admitting: Pulmonary Disease

## 2018-11-18 DIAGNOSIS — J3081 Allergic rhinitis due to animal (cat) (dog) hair and dander: Secondary | ICD-10-CM | POA: Diagnosis not present

## 2018-11-18 DIAGNOSIS — J3089 Other allergic rhinitis: Secondary | ICD-10-CM | POA: Diagnosis not present

## 2018-11-25 DIAGNOSIS — J3089 Other allergic rhinitis: Secondary | ICD-10-CM | POA: Diagnosis not present

## 2018-11-25 DIAGNOSIS — J3081 Allergic rhinitis due to animal (cat) (dog) hair and dander: Secondary | ICD-10-CM | POA: Diagnosis not present

## 2018-11-26 ENCOUNTER — Ambulatory Visit (INDEPENDENT_AMBULATORY_CARE_PROVIDER_SITE_OTHER): Payer: 59 | Admitting: Pulmonary Disease

## 2018-11-26 ENCOUNTER — Other Ambulatory Visit: Payer: Self-pay

## 2018-11-26 ENCOUNTER — Encounter: Payer: Self-pay | Admitting: Pulmonary Disease

## 2018-11-26 DIAGNOSIS — G4733 Obstructive sleep apnea (adult) (pediatric): Secondary | ICD-10-CM | POA: Diagnosis not present

## 2018-11-26 NOTE — Patient Instructions (Signed)
Call or email if you would like to change medical supply companies  Follow up in 1 year

## 2018-11-26 NOTE — Progress Notes (Signed)
Sean Allen, Sean Allen, Sean Allen  Chief Complaint  Patient presents with  . Follow-up    OSA >> Using CPAP machine every night. Denies issues with machine, mask or pressure. DME is [employer].    Constitutional:  There were no vitals taken for this visit.  Deferred.  Past Medical History:  Varicocele, Retinal detachment, GERD, Asthma, Allergic rhinitis  Brief Summary:  Sean Allen is a 33 y.o. male with obstructive sleep apnea.  Virtual Visit via Telephone Note  I connected with Sean Allen on 11/26/18 at 11:15 AM EDT by telephone Sean verified that I am speaking with the correct person using two identifiers.  Location: Patient: home Provider: medical office   I discussed the limitations, risks, security Sean privacy concerns of performing an evaluation Sean management service by telephone Sean the availability of in person appointments. I also discussed with the patient that there may be a patient responsible charge related to this service. The patient expressed understanding Sean agreed to proceed.   He has been doing well with CPAP.  No issues with mask fit.  Occasional mouth dryness.  Not having sinus congestion, sore throat, or aerophagia.  He had larger out of pocket expense for supplies from [employer] compared to [facility] before he met his insurance deductible.  He has since met his deductible, Sean will see how much he is charged for supplies.  If price remains high, then he might want to switch DMEs.   Physical Exam:   Deferred.  Assessment/Plan:   Obstructive sleep apnea. - he is compliant with CPAP Sean reports benefit - continue auto CPAP - he will call if he wants to change DMEs   Patient Instructions  Call or email if you would like to change medical supply companies  Follow up in 1 year   I discussed the assessment Sean treatment plan with the patient. The patient was provided an opportunity to ask questions Sean all were answered. The patient agreed  with the plan Sean demonstrated an understanding of the instructions.   The patient was advised to call back or seek an in-person evaluation if the symptoms worsen or if the condition fails to improve as anticipated.  I provided 14 minutes of non-face-to-face time during this encounter.  Sean Mires, MD [place] Pager: [phone] 11/26/2018, 11:31 AM  Flow Sheet      Sleep tests:  HST 02/10/15 >> AHI 44.4, SaO2 low 76%. Auto CPAP7/13/20 to 11/24/18 >> used on 30 of 30 nights with average 7 hrs 10 min.  Average AHI 2.2 with median CPAP 9 Sean 95 th percentile CPAP 12 cm H2O  Medications:   Allergies as of 11/26/2018      Reactions   Ciprofloxacin    GI upset   Dust Mite Extract    Grass Extracts [gramineae Pollens]    Mold Extract [trichophyton]    Other    Cats       Medication List       Accurate as of November 26, 2018 11:31 AM. If you have any questions, ask your nurse or doctor.        albuterol 108 (90 Base) MCG/ACT inhaler Commonly known as: ProAir HFA Inhale 2 puffs into the lungs every 4 (four) hours as needed for wheezing or shortness of breath.   pantoprazole 20 MG tablet Commonly known as: PROTONIX TAKE 1 TABLET BY MOUTH DAILY AS NEEDED.       Past Surgical History:  He  has  a past surgical history that includes Tonsillectomy Sean adenoidectomy; Eye surgery; Sean Testicle surgery (Right).  Family History:  His family history includes Arthritis in his mother; Bradycardia in his mother; Diabetes in his father, maternal grandfather, Sean paternal aunt; Healthy in his sister; Heart attack in his paternal grandfather Sean paternal grandmother; Heart disease in his maternal grandfather, maternal grandmother, paternal grandfather, Sean paternal grandmother; High Cholesterol in his paternal grandmother; Hyperlipidemia in his father; Hypertension in his paternal grandmother; Kidney failure in his maternal grandfather Sean maternal grandmother;  Lung disease in his father; Lymphoma in his maternal grandmother; Other in his father; Prostate cancer in his paternal grandfather Sean paternal great-grandfather; Sleep apnea in his father.  Social History:  He  reports that he has never smoked. He has never used smokeless tobacco. He reports current alcohol use. He reports that he does not use drugs.

## 2018-12-01 DIAGNOSIS — J3089 Other allergic rhinitis: Secondary | ICD-10-CM | POA: Diagnosis not present

## 2018-12-01 DIAGNOSIS — J3081 Allergic rhinitis due to animal (cat) (dog) hair and dander: Secondary | ICD-10-CM | POA: Diagnosis not present

## 2018-12-09 DIAGNOSIS — J3089 Other allergic rhinitis: Secondary | ICD-10-CM | POA: Diagnosis not present

## 2018-12-09 DIAGNOSIS — J3081 Allergic rhinitis due to animal (cat) (dog) hair and dander: Secondary | ICD-10-CM | POA: Diagnosis not present

## 2018-12-11 DIAGNOSIS — J3089 Other allergic rhinitis: Secondary | ICD-10-CM | POA: Diagnosis not present

## 2018-12-11 DIAGNOSIS — J3081 Allergic rhinitis due to animal (cat) (dog) hair and dander: Secondary | ICD-10-CM | POA: Diagnosis not present

## 2018-12-16 DIAGNOSIS — J3081 Allergic rhinitis due to animal (cat) (dog) hair and dander: Secondary | ICD-10-CM | POA: Diagnosis not present

## 2018-12-16 DIAGNOSIS — J3089 Other allergic rhinitis: Secondary | ICD-10-CM | POA: Diagnosis not present

## 2018-12-17 ENCOUNTER — Encounter: Payer: 59 | Admitting: Family Medicine

## 2018-12-23 DIAGNOSIS — J3081 Allergic rhinitis due to animal (cat) (dog) hair and dander: Secondary | ICD-10-CM | POA: Diagnosis not present

## 2018-12-23 DIAGNOSIS — J3089 Other allergic rhinitis: Secondary | ICD-10-CM | POA: Diagnosis not present

## 2018-12-28 MED FILL — PANTOPRAZOLE SOD DR 20 MG T: 20 | 60 days supply | Qty: 60 | Fill #2

## 2018-12-29 DIAGNOSIS — G4733 Obstructive sleep apnea (adult) (pediatric): Secondary | ICD-10-CM | POA: Diagnosis not present

## 2018-12-30 DIAGNOSIS — J3081 Allergic rhinitis due to animal (cat) (dog) hair and dander: Secondary | ICD-10-CM | POA: Diagnosis not present

## 2018-12-30 DIAGNOSIS — J301 Allergic rhinitis due to pollen: Secondary | ICD-10-CM | POA: Diagnosis not present

## 2018-12-30 DIAGNOSIS — J3089 Other allergic rhinitis: Secondary | ICD-10-CM | POA: Diagnosis not present

## 2019-01-04 ENCOUNTER — Encounter: Payer: 59 | Admitting: Family Medicine

## 2019-01-04 DIAGNOSIS — J3081 Allergic rhinitis due to animal (cat) (dog) hair and dander: Secondary | ICD-10-CM | POA: Diagnosis not present

## 2019-01-04 DIAGNOSIS — J3089 Other allergic rhinitis: Secondary | ICD-10-CM | POA: Diagnosis not present

## 2019-01-13 DIAGNOSIS — J3081 Allergic rhinitis due to animal (cat) (dog) hair and dander: Secondary | ICD-10-CM | POA: Diagnosis not present

## 2019-01-13 DIAGNOSIS — J3089 Other allergic rhinitis: Secondary | ICD-10-CM | POA: Diagnosis not present

## 2019-01-20 DIAGNOSIS — J3081 Allergic rhinitis due to animal (cat) (dog) hair and dander: Secondary | ICD-10-CM | POA: Diagnosis not present

## 2019-01-20 DIAGNOSIS — J3089 Other allergic rhinitis: Secondary | ICD-10-CM | POA: Diagnosis not present

## 2019-01-20 DIAGNOSIS — J301 Allergic rhinitis due to pollen: Secondary | ICD-10-CM | POA: Diagnosis not present

## 2019-01-21 ENCOUNTER — Other Ambulatory Visit: Payer: Self-pay

## 2019-01-21 DIAGNOSIS — Z20822 Contact with and (suspected) exposure to covid-19: Secondary | ICD-10-CM

## 2019-01-21 DIAGNOSIS — Z20828 Contact with and (suspected) exposure to other viral communicable diseases: Secondary | ICD-10-CM | POA: Diagnosis not present

## 2019-01-23 LAB — NOVEL CORONAVIRUS, NAA: SARS-CoV-2, NAA: DETECTED — AB

## 2019-01-25 ENCOUNTER — Encounter: Payer: Self-pay | Admitting: Family Medicine

## 2019-02-03 ENCOUNTER — Encounter: Payer: Self-pay | Admitting: Family Medicine

## 2019-02-03 ENCOUNTER — Encounter: Payer: 59 | Admitting: Family Medicine

## 2019-02-03 DIAGNOSIS — J3089 Other allergic rhinitis: Secondary | ICD-10-CM | POA: Diagnosis not present

## 2019-02-03 DIAGNOSIS — J3081 Allergic rhinitis due to animal (cat) (dog) hair and dander: Secondary | ICD-10-CM | POA: Diagnosis not present

## 2019-02-03 NOTE — Progress Notes (Deleted)
Virtual Visit via Video Note  I connected with Sean Allen  on 02/03/19 at 10:00 AM EDT by a video enabled telemedicine application and verified that I am speaking with the correct person using two identifiers.  Location patient: home Location provider:work or home office Persons participating in the virtual visit: patient, provider  I discussed the limitations of evaluation and management by telemedicine and the availability of in person appointments. The patient expressed understanding and agreed to proceed.   Sean Allen DOB: April 28, 1985 Encounter date: 02/03/2019  This is a 33 y.o. male who presents with No chief complaint on file.   History of present illness: +COVID testing 01/21/19  HPI   Allergies  Allergen Reactions  . Ciprofloxacin     GI upset  . Dust Mite Extract   . Grass Extracts [Gramineae Pollens]   . Mold Extract [Trichophyton]   . Other     Cats    No outpatient medications have been marked as taking for the 02/03/19 encounter (Appointment) with Caren Macadam, MD.    Review of Systems  Objective:  There were no vitals taken for this visit.      BP Readings from Last 3 Encounters:  12/11/17 102/70  08/04/17 124/72  06/09/17 122/90   Wt Readings from Last 3 Encounters:  12/11/17 291 lb 14.4 oz (132.4 kg)  08/04/17 (!) 300 lb 1.6 oz (136.1 kg)  06/09/17 295 lb (133.8 kg)    EXAM:  GENERAL: alert, oriented, appears well and in no acute distress  HEENT: atraumatic, conjunctiva clear, no obvious abnormalities on inspection of external nose and ears  NECK: normal movements of the head and neck  LUNGS: on inspection no signs of respiratory distress, breathing rate appears normal, no obvious gross SOB, gasping or wheezing  CV: no obvious cyanosis  MS: moves all visible extremities without noticeable abnormality  PSYCH/NEURO: pleasant and cooperative, no obvious depression or anxiety, speech and thought processing grossly  intact ***  Assessment/Plan  There are no diagnoses linked to this encounter.     I discussed the assessment and treatment plan with the patient. The patient was provided an opportunity to ask questions and all were answered. The patient agreed with the plan and demonstrated an understanding of the instructions.   The patient was advised to call back or seek an in-person evaluation if the symptoms worsen or if the condition fails to improve as anticipated.  I provided *** minutes of non-face-to-face time during this encounter.   Micheline Rough, MD

## 2019-02-10 DIAGNOSIS — J3081 Allergic rhinitis due to animal (cat) (dog) hair and dander: Secondary | ICD-10-CM | POA: Diagnosis not present

## 2019-02-10 DIAGNOSIS — J3089 Other allergic rhinitis: Secondary | ICD-10-CM | POA: Diagnosis not present

## 2019-02-19 ENCOUNTER — Encounter: Payer: Self-pay | Admitting: Family Medicine

## 2019-02-19 ENCOUNTER — Ambulatory Visit (INDEPENDENT_AMBULATORY_CARE_PROVIDER_SITE_OTHER): Payer: 59 | Admitting: Family Medicine

## 2019-02-19 ENCOUNTER — Other Ambulatory Visit: Payer: Self-pay

## 2019-02-19 VITALS — BP 116/82 | HR 80 | Temp 97.2°F | Ht 73.25 in | Wt 286.9 lb

## 2019-02-19 DIAGNOSIS — Z131 Encounter for screening for diabetes mellitus: Secondary | ICD-10-CM | POA: Diagnosis not present

## 2019-02-19 DIAGNOSIS — E785 Hyperlipidemia, unspecified: Secondary | ICD-10-CM

## 2019-02-19 DIAGNOSIS — J3089 Other allergic rhinitis: Secondary | ICD-10-CM | POA: Diagnosis not present

## 2019-02-19 DIAGNOSIS — J3081 Allergic rhinitis due to animal (cat) (dog) hair and dander: Secondary | ICD-10-CM | POA: Diagnosis not present

## 2019-02-19 DIAGNOSIS — R5383 Other fatigue: Secondary | ICD-10-CM | POA: Diagnosis not present

## 2019-02-19 DIAGNOSIS — Z0000 Encounter for general adult medical examination without abnormal findings: Secondary | ICD-10-CM | POA: Diagnosis not present

## 2019-02-19 DIAGNOSIS — K219 Gastro-esophageal reflux disease without esophagitis: Secondary | ICD-10-CM

## 2019-02-19 LAB — COMPREHENSIVE METABOLIC PANEL
ALT: 48 U/L (ref 0–53)
AST: 21 U/L (ref 0–37)
Albumin: 4.9 g/dL (ref 3.5–5.2)
Alkaline Phosphatase: 73 U/L (ref 39–117)
BUN: 16 mg/dL (ref 6–23)
CO2: 28 mEq/L (ref 19–32)
Calcium: 9.9 mg/dL (ref 8.4–10.5)
Chloride: 101 mEq/L (ref 96–112)
Creatinine, Ser: 1.13 mg/dL (ref 0.40–1.50)
GFR: 75.35 mL/min (ref >60.00)
Glucose, Bld: 93 mg/dL (ref 70–99)
Potassium: 4.6 mEq/L (ref 3.5–5.1)
Sodium: 139 mEq/L (ref 135–145)
Total Bilirubin: 0.7 mg/dL (ref 0.2–1.2)
Total Protein: 7.4 g/dL (ref 6.0–8.3)

## 2019-02-19 LAB — CBC WITH DIFFERENTIAL/PLATELET
Basophils Absolute: 0 10*3/uL (ref 0.0–0.1)
Basophils Relative: 0.4 % (ref 0.0–3.0)
Eosinophils Absolute: 0.1 10*3/uL (ref 0.0–0.7)
Eosinophils Relative: 1.7 % (ref 0.0–5.0)
HCT: 43.6 % (ref 39.0–52.0)
Hemoglobin: 14.9 g/dL (ref 13.0–17.0)
Lymphocytes Relative: 24.9 % (ref 12.0–46.0)
Lymphs Abs: 1.3 10*3/uL (ref 0.7–4.0)
MCHC: 34.3 g/dL (ref 30.0–36.0)
MCV: 93.3 fl (ref 78.0–100.0)
Monocytes Absolute: 0.4 10*3/uL (ref 0.1–1.0)
Monocytes Relative: 7.4 % (ref 3.0–12.0)
Neutro Abs: 3.4 10*3/uL (ref 1.4–7.7)
Neutrophils Relative %: 65.6 % (ref 43.0–77.0)
Platelets: 210 10*3/uL (ref 150.0–400.0)
RBC: 4.67 Mil/uL (ref 4.22–5.81)
RDW: 13 % (ref 11.5–15.5)
WBC: 5.2 10*3/uL (ref 4.0–10.5)

## 2019-02-19 LAB — LIPID PANEL
Cholesterol: 186 mg/dL (ref 0–200)
HDL: 51.4 mg/dL (ref >39.00)
LDL Cholesterol: 111 mg/dL — ABNORMAL HIGH (ref 0–99)
NonHDL: 134.76
Total CHOL/HDL Ratio: 4
Triglycerides: 121 mg/dL (ref 0.0–149.0)
VLDL: 24.2 mg/dL (ref 0.0–40.0)

## 2019-02-19 LAB — TSH: TSH: 1.88 u[IU]/mL (ref 0.35–4.50)

## 2019-02-19 LAB — HEMOGLOBIN A1C: Hgb A1c MFr Bld: 5 % (ref 4.6–6.5)

## 2019-02-19 MED ORDER — PANTOPRAZOLE SODIUM 20 MG PO TBEC: 20.0000 mg | DELAYED_RELEASE_TABLET | Freq: Every day | ORAL | 1 refills | Status: DC | PRN

## 2019-02-19 MED FILL — PANTOPRAZOLE SOD DR 20 MG T: 20 | 90 days supply | Qty: 90 | Fill #0

## 2019-02-19 NOTE — Progress Notes (Signed)
Sean Allen DOB: 03/23/1986 Encounter date: 02/19/2019  This is a 33 y.o. male who presents for complete physical   History of present illness/Additional concerns: No specific concerns today. Sometimes right ear bothering him in last 2-3 weeks, but hasn't swam in awhile.   Sometimes getting more gassy/bloated at night. Thinks has IBS because everyone in family has this. Some nights worse lately - thinks maybe stress.   Thinks starting to get some carpal tunnel from working on computer all day. Helps to work out and helps to do stretches. Gets numbness in 2 index fingers, sometimes thumb - but usually just if on computer.   Asthma: Follows with [facility]. Has been doing well. Not using inhaler much unless very active or really humid.   OSA: Follows with sleep specialist and previously working on weight loss. Saw them about a month ago. Doing ok with this. They still feel that he will not need machine once hits weight goal, but hunter is not as confident. Goal is to get to 250lb. He has lost just over 40lb. He has been doing crossfit (slowed down during [place]) but harder with COVID. Has been working on sticking with diet. Gym had a few cases of COVID and he thinks that was where he got COVID previously. Just goes to work out if he doesn't have to do something on the floor.   Cholesterol screening ordered in May, but has not yet completed.  Energy level is ok if sleep is ok, but there has been a lot of stress lately. Wife lost pregnancy in June.   Sometimes states he gets "pee shy". Like if bathroom full, can't be in private and has difficulty going, but if at home stream is good/strong. Urinates well. Empties completely when he goes to the bathroom. Usually doesn't wake up at night to urinate unless something wakes him up.   GERD: takes the protonix 3-4 days/week.   Past Medical History:  Diagnosis Date  . Allergic rhinitis 08/30/2013  . Asthma   . GERD (gastroesophageal  reflux disease)   . Hx of retinal detachment - [facility] 08/30/2013   bilat  . OSA (obstructive sleep apnea) 02/14/2015  . Varicocele, evaluated by urologist in the past 08/30/2013   Past Surgical History:  Procedure Laterality Date  . EYE SURGERY     bilateral retinal detachment  . TESTICLE SURGERY Right   . TONSILLECTOMY AND ADENOIDECTOMY     Allergies  Allergen Reactions  . Ciprofloxacin     GI upset  . Dust Mite Extract   . Grass Extracts [Gramineae Pollens]   . Mold Extract [Trichophyton]   . Other     Cats    Current Meds  Medication Sig  . albuterol (PROAIR HFA) 108 (90 BASE) MCG/ACT inhaler Inhale 2 puffs into the lungs every 4 (four) hours as needed for wheezing or shortness of breath.  . Ascorbic Acid (VITAMIN C) 1000 MG tablet Take 1,000 mg by mouth daily.  . Multiple Vitamins-Minerals (MULTIVITAMIN ADULT PO) Take by mouth.  . pantoprazole (PROTONIX) 20 MG tablet Take 1 tablet (20 mg total) by mouth daily as needed.  . [DISCONTINUED] pantoprazole (PROTONIX) 20 MG tablet TAKE 1 TABLET BY MOUTH DAILY AS NEEDED.   Social History   Tobacco Use  . Smoking status: Never Smoker  . Smokeless tobacco: Never Used  Substance Use Topics  . Alcohol use: Yes    Alcohol/week: 0.0 standard drinks    Comment: occ 1-2 drinks  Family History  Problem Relation Age of Onset  . Hyperlipidemia Father   . Other Father        prostate enlargement  . Sleep apnea Father   . Lung disease Father        asbestosis  . Diabetes Father        prediabetes  . Hypertension Paternal Grandmother   . Heart disease Paternal Grandmother   . Heart attack Paternal Grandmother   . High Cholesterol Paternal Grandmother   . Arthritis Mother        neck, knees  . Bradycardia Mother   . Heart disease Maternal Grandmother   . Lymphoma Maternal Grandmother   . Kidney failure Maternal Grandmother   . Heart disease Maternal Grandfather   . Kidney failure Maternal Grandfather   .  Diabetes Maternal Grandfather   . Heart disease Paternal Grandfather   . Heart attack Paternal Grandfather   . Prostate cancer Paternal Grandfather 38  . Healthy Sister        fatty liver  . Prostate cancer Paternal Great-grandfather   . Diabetes Paternal Aunt      Review of Systems  Constitutional: Negative for activity change, appetite change, chills, fatigue, fever and unexpected weight change.  HENT: Negative for congestion, ear pain, hearing loss, sinus pressure, sinus pain, sore throat and trouble swallowing.   Eyes: Negative for pain and visual disturbance.  Respiratory: Negative for cough, chest tightness, shortness of breath and wheezing.   Cardiovascular: Negative for chest pain, palpitations and leg swelling.  Gastrointestinal: Negative for abdominal distention, abdominal pain, blood in stool, constipation, diarrhea, nausea and vomiting.  Genitourinary: Negative for decreased urine volume, difficulty urinating, dysuria, penile pain and testicular pain.  Musculoskeletal: Negative for arthralgias, back pain and joint swelling.  Skin: Negative for rash.  Neurological: Negative for dizziness, weakness, numbness and headaches.  Hematological: Negative for adenopathy. Does not bruise/bleed easily.  Psychiatric/Behavioral: Negative for agitation, sleep disturbance and suicidal ideas. The patient is not nervous/anxious.     CBC:  Lab Results  Component Value Date   WBC 4.8 12/11/2017   HGB 14.3 12/11/2017   HCT 42.3 12/11/2017   MCHC 33.9 12/11/2017   RDW 13.4 12/11/2017   PLT 227.0 12/11/2017   CMP: Lab Results  Component Value Date   NA 139 12/11/2017   K 4.8 12/11/2017   CL 103 12/11/2017   CO2 30 12/11/2017   GLUCOSE 89 12/11/2017   BUN 15 12/11/2017   CREATININE 1.31 12/11/2017   CALCIUM 9.7 12/11/2017   LIPID: Lab Results  Component Value Date   CHOL 171 12/09/2016   TRIG 155.0 (H) 12/09/2016   HDL 42.40 12/09/2016   LDLCALC 98 12/09/2016     Objective:  BP 116/82 (BP Location: Left Arm, Patient Position: Sitting, Cuff Size: Large)   Pulse 80   Temp (!) 97.2 F (36.2 C) (Temporal)   Ht 6' 1.25" (1.861 m)   Wt 286 lb 14.4 oz (130.1 kg)   SpO2 98%   BMI 37.59 kg/m   Weight: 286 lb 14.4 oz (130.1 kg)   BP Readings from Last 3 Encounters:  02/19/19 116/82  12/11/17 102/70  08/04/17 124/72   Wt Readings from Last 3 Encounters:  02/19/19 286 lb 14.4 oz (130.1 kg)  12/11/17 291 lb 14.4 oz (132.4 kg)  08/04/17 (!) 300 lb 1.6 oz (136.1 kg)    Physical Exam Constitutional:      General: He is not in acute distress.    Appearance: He  is well-developed.  HENT:     Head: Normocephalic and atraumatic.     Right Ear: External ear normal.     Left Ear: External ear normal.     Nose: Nose normal.     Mouth/Throat:     Pharynx: No oropharyngeal exudate.  Eyes:     Conjunctiva/sclera: Conjunctivae normal.     Pupils: Pupils are equal, round, and reactive to light.  Neck:     Musculoskeletal: Neck supple.     Thyroid: No thyromegaly.  Cardiovascular:     Rate and Rhythm: Normal rate and regular rhythm.     Heart sounds: Normal heart sounds. No murmur. No friction rub. No gallop.   Pulmonary:     Effort: Pulmonary effort is normal. No respiratory distress.     Breath sounds: Normal breath sounds. No stridor. No wheezing or rales.  Abdominal:     General: Bowel sounds are normal.     Palpations: Abdomen is soft.  Genitourinary:    Penis: Normal and circumcised.      Scrotum/Testes:        Right: Mass or tenderness not present.        Left: Varicocele present. Mass or tenderness not present.     Comments: Right testis is smaller than left (appears consistent with previous urology note on chart review).  Musculoskeletal: Normal range of motion.  Skin:    General: Skin is warm and dry.  Neurological:     Mental Status: He is alert and oriented to person, place, and time.  Psychiatric:        Behavior: Behavior  normal.        Thought Content: Thought content normal.        Judgment: Judgment normal.     Assessment/Plan: There are no preventive care reminders to display for this patient. Health Maintenance reviewed.  1. Encounter for preventive health examination Keep up with regular exercise.   2. Fatigue, unspecified type Intermittent; better with good nights sleep. - CBC with Differential/Platelet; Future - TSH; Future  3. Screening for diabetes mellitus - Hemoglobin A1c; Future  4. Hyperlipidemia, unspecified hyperlipidemia type - TSH; Future - Comprehensive metabolic panel; Future - Lipid panel; Future  5. Gastroesophageal reflux disease, unspecified whether esophagitis present Well controlled with pronotix; but using intermittently.  - pantoprazole (PROTONIX) 20 MG tablet; Take 1 tablet (20 mg total) by mouth daily as needed.  Dispense: 90 tablet; Refill: 1  Return in about 6 months (around 08/19/2019) for Chronic condition visit.  Micheline Rough, MD

## 2019-02-21 ENCOUNTER — Encounter: Payer: Self-pay | Admitting: Family Medicine

## 2019-02-24 DIAGNOSIS — J3089 Other allergic rhinitis: Secondary | ICD-10-CM | POA: Diagnosis not present

## 2019-02-24 DIAGNOSIS — J301 Allergic rhinitis due to pollen: Secondary | ICD-10-CM | POA: Diagnosis not present

## 2019-02-24 DIAGNOSIS — J3081 Allergic rhinitis due to animal (cat) (dog) hair and dander: Secondary | ICD-10-CM | POA: Diagnosis not present

## 2019-03-04 DIAGNOSIS — J3089 Other allergic rhinitis: Secondary | ICD-10-CM | POA: Diagnosis not present

## 2019-03-04 DIAGNOSIS — J3081 Allergic rhinitis due to animal (cat) (dog) hair and dander: Secondary | ICD-10-CM | POA: Diagnosis not present

## 2019-03-08 DIAGNOSIS — J3089 Other allergic rhinitis: Secondary | ICD-10-CM | POA: Diagnosis not present

## 2019-03-08 DIAGNOSIS — J3081 Allergic rhinitis due to animal (cat) (dog) hair and dander: Secondary | ICD-10-CM | POA: Diagnosis not present

## 2019-03-08 DIAGNOSIS — J301 Allergic rhinitis due to pollen: Secondary | ICD-10-CM | POA: Diagnosis not present

## 2019-03-16 DIAGNOSIS — J3081 Allergic rhinitis due to animal (cat) (dog) hair and dander: Secondary | ICD-10-CM | POA: Diagnosis not present

## 2019-03-16 DIAGNOSIS — J3089 Other allergic rhinitis: Secondary | ICD-10-CM | POA: Diagnosis not present

## 2019-03-24 DIAGNOSIS — J3089 Other allergic rhinitis: Secondary | ICD-10-CM | POA: Diagnosis not present

## 2019-03-24 DIAGNOSIS — J3081 Allergic rhinitis due to animal (cat) (dog) hair and dander: Secondary | ICD-10-CM | POA: Diagnosis not present

## 2019-03-25 DIAGNOSIS — G4733 Obstructive sleep apnea (adult) (pediatric): Secondary | ICD-10-CM | POA: Diagnosis not present

## 2019-03-30 DIAGNOSIS — J3089 Other allergic rhinitis: Secondary | ICD-10-CM | POA: Diagnosis not present

## 2019-03-30 DIAGNOSIS — J3081 Allergic rhinitis due to animal (cat) (dog) hair and dander: Secondary | ICD-10-CM | POA: Diagnosis not present

## 2019-03-31 DIAGNOSIS — J3081 Allergic rhinitis due to animal (cat) (dog) hair and dander: Secondary | ICD-10-CM | POA: Diagnosis not present

## 2019-03-31 DIAGNOSIS — J3089 Other allergic rhinitis: Secondary | ICD-10-CM | POA: Diagnosis not present

## 2019-04-06 DIAGNOSIS — J3081 Allergic rhinitis due to animal (cat) (dog) hair and dander: Secondary | ICD-10-CM | POA: Diagnosis not present

## 2019-04-06 DIAGNOSIS — J3089 Other allergic rhinitis: Secondary | ICD-10-CM | POA: Diagnosis not present

## 2019-04-13 DIAGNOSIS — J3089 Other allergic rhinitis: Secondary | ICD-10-CM | POA: Diagnosis not present

## 2019-04-13 DIAGNOSIS — J3081 Allergic rhinitis due to animal (cat) (dog) hair and dander: Secondary | ICD-10-CM | POA: Diagnosis not present

## 2019-04-21 DIAGNOSIS — J3089 Other allergic rhinitis: Secondary | ICD-10-CM | POA: Diagnosis not present

## 2019-04-21 DIAGNOSIS — J3081 Allergic rhinitis due to animal (cat) (dog) hair and dander: Secondary | ICD-10-CM | POA: Diagnosis not present

## 2019-04-28 DIAGNOSIS — J3081 Allergic rhinitis due to animal (cat) (dog) hair and dander: Secondary | ICD-10-CM | POA: Diagnosis not present

## 2019-04-28 DIAGNOSIS — J3089 Other allergic rhinitis: Secondary | ICD-10-CM | POA: Diagnosis not present

## 2019-05-05 DIAGNOSIS — H35411 Lattice degeneration of retina, right eye: Secondary | ICD-10-CM | POA: Diagnosis not present

## 2019-05-05 DIAGNOSIS — J3089 Other allergic rhinitis: Secondary | ICD-10-CM | POA: Diagnosis not present

## 2019-05-05 DIAGNOSIS — H35462 Secondary vitreoretinal degeneration, left eye: Secondary | ICD-10-CM | POA: Diagnosis not present

## 2019-05-05 DIAGNOSIS — J3081 Allergic rhinitis due to animal (cat) (dog) hair and dander: Secondary | ICD-10-CM | POA: Diagnosis not present

## 2019-05-11 DIAGNOSIS — J3081 Allergic rhinitis due to animal (cat) (dog) hair and dander: Secondary | ICD-10-CM | POA: Diagnosis not present

## 2019-05-11 DIAGNOSIS — J3089 Other allergic rhinitis: Secondary | ICD-10-CM | POA: Diagnosis not present

## 2019-05-19 DIAGNOSIS — J3089 Other allergic rhinitis: Secondary | ICD-10-CM | POA: Diagnosis not present

## 2019-05-19 DIAGNOSIS — J3081 Allergic rhinitis due to animal (cat) (dog) hair and dander: Secondary | ICD-10-CM | POA: Diagnosis not present

## 2019-05-26 DIAGNOSIS — J3081 Allergic rhinitis due to animal (cat) (dog) hair and dander: Secondary | ICD-10-CM | POA: Diagnosis not present

## 2019-05-26 DIAGNOSIS — J3089 Other allergic rhinitis: Secondary | ICD-10-CM | POA: Diagnosis not present

## 2019-06-02 DIAGNOSIS — J3089 Other allergic rhinitis: Secondary | ICD-10-CM | POA: Diagnosis not present

## 2019-06-02 DIAGNOSIS — J3081 Allergic rhinitis due to animal (cat) (dog) hair and dander: Secondary | ICD-10-CM | POA: Diagnosis not present

## 2019-06-08 MED FILL — PANTOPRAZOLE SOD DR 20 MG T: 20 | 90 days supply | Qty: 90 | Fill #1

## 2019-06-09 DIAGNOSIS — J3089 Other allergic rhinitis: Secondary | ICD-10-CM | POA: Diagnosis not present

## 2019-06-09 DIAGNOSIS — H5213 Myopia, bilateral: Secondary | ICD-10-CM | POA: Diagnosis not present

## 2019-06-09 DIAGNOSIS — J3081 Allergic rhinitis due to animal (cat) (dog) hair and dander: Secondary | ICD-10-CM | POA: Diagnosis not present

## 2019-06-09 DIAGNOSIS — J301 Allergic rhinitis due to pollen: Secondary | ICD-10-CM | POA: Diagnosis not present

## 2019-06-09 DIAGNOSIS — H52223 Regular astigmatism, bilateral: Secondary | ICD-10-CM | POA: Diagnosis not present

## 2019-06-16 DIAGNOSIS — J3081 Allergic rhinitis due to animal (cat) (dog) hair and dander: Secondary | ICD-10-CM | POA: Diagnosis not present

## 2019-06-16 DIAGNOSIS — J3089 Other allergic rhinitis: Secondary | ICD-10-CM | POA: Diagnosis not present

## 2019-06-21 DIAGNOSIS — G4733 Obstructive sleep apnea (adult) (pediatric): Secondary | ICD-10-CM | POA: Diagnosis not present

## 2019-06-22 DIAGNOSIS — J3081 Allergic rhinitis due to animal (cat) (dog) hair and dander: Secondary | ICD-10-CM | POA: Diagnosis not present

## 2019-06-25 DIAGNOSIS — J3089 Other allergic rhinitis: Secondary | ICD-10-CM | POA: Diagnosis not present

## 2019-06-25 DIAGNOSIS — K219 Gastro-esophageal reflux disease without esophagitis: Secondary | ICD-10-CM | POA: Diagnosis not present

## 2019-06-25 DIAGNOSIS — J3081 Allergic rhinitis due to animal (cat) (dog) hair and dander: Secondary | ICD-10-CM | POA: Diagnosis not present

## 2019-06-25 DIAGNOSIS — J4520 Mild intermittent asthma, uncomplicated: Secondary | ICD-10-CM | POA: Diagnosis not present

## 2019-06-25 MED FILL — ALBUTEROL SULFATE HFA 108 (: 108 (90 BAS | 25 days supply | Qty: 18 | Fill #0

## 2019-07-02 DIAGNOSIS — J3089 Other allergic rhinitis: Secondary | ICD-10-CM | POA: Diagnosis not present

## 2019-07-02 DIAGNOSIS — J3081 Allergic rhinitis due to animal (cat) (dog) hair and dander: Secondary | ICD-10-CM | POA: Diagnosis not present

## 2019-07-07 DIAGNOSIS — J3089 Other allergic rhinitis: Secondary | ICD-10-CM | POA: Diagnosis not present

## 2019-07-07 DIAGNOSIS — J3081 Allergic rhinitis due to animal (cat) (dog) hair and dander: Secondary | ICD-10-CM | POA: Diagnosis not present

## 2019-07-14 DIAGNOSIS — J3089 Other allergic rhinitis: Secondary | ICD-10-CM | POA: Diagnosis not present

## 2019-07-14 DIAGNOSIS — J3081 Allergic rhinitis due to animal (cat) (dog) hair and dander: Secondary | ICD-10-CM | POA: Diagnosis not present

## 2019-07-21 DIAGNOSIS — J3089 Other allergic rhinitis: Secondary | ICD-10-CM | POA: Diagnosis not present

## 2019-07-21 DIAGNOSIS — J3081 Allergic rhinitis due to animal (cat) (dog) hair and dander: Secondary | ICD-10-CM | POA: Diagnosis not present

## 2019-07-28 DIAGNOSIS — J3089 Other allergic rhinitis: Secondary | ICD-10-CM | POA: Diagnosis not present

## 2019-07-28 DIAGNOSIS — J3081 Allergic rhinitis due to animal (cat) (dog) hair and dander: Secondary | ICD-10-CM | POA: Diagnosis not present

## 2019-08-04 DIAGNOSIS — J3089 Other allergic rhinitis: Secondary | ICD-10-CM | POA: Diagnosis not present

## 2019-08-04 DIAGNOSIS — J3081 Allergic rhinitis due to animal (cat) (dog) hair and dander: Secondary | ICD-10-CM | POA: Diagnosis not present

## 2019-08-09 DIAGNOSIS — J3089 Other allergic rhinitis: Secondary | ICD-10-CM | POA: Diagnosis not present

## 2019-08-09 DIAGNOSIS — J3081 Allergic rhinitis due to animal (cat) (dog) hair and dander: Secondary | ICD-10-CM | POA: Diagnosis not present

## 2019-08-20 ENCOUNTER — Other Ambulatory Visit: Payer: Self-pay

## 2019-08-20 DIAGNOSIS — J3081 Allergic rhinitis due to animal (cat) (dog) hair and dander: Secondary | ICD-10-CM | POA: Diagnosis not present

## 2019-08-20 DIAGNOSIS — J3089 Other allergic rhinitis: Secondary | ICD-10-CM | POA: Diagnosis not present

## 2019-08-23 ENCOUNTER — Encounter: Payer: Self-pay | Admitting: Family Medicine

## 2019-08-23 ENCOUNTER — Ambulatory Visit (INDEPENDENT_AMBULATORY_CARE_PROVIDER_SITE_OTHER): Payer: 59 | Admitting: Family Medicine

## 2019-08-23 ENCOUNTER — Other Ambulatory Visit: Payer: Self-pay

## 2019-08-23 VITALS — BP 120/78 | HR 60 | Temp 97.2°F | Ht 73.25 in | Wt 298.3 lb

## 2019-08-23 DIAGNOSIS — R7309 Other abnormal glucose: Secondary | ICD-10-CM

## 2019-08-23 DIAGNOSIS — J309 Allergic rhinitis, unspecified: Secondary | ICD-10-CM | POA: Diagnosis not present

## 2019-08-23 DIAGNOSIS — J3081 Allergic rhinitis due to animal (cat) (dog) hair and dander: Secondary | ICD-10-CM | POA: Diagnosis not present

## 2019-08-23 DIAGNOSIS — Z13220 Encounter for screening for lipoid disorders: Secondary | ICD-10-CM

## 2019-08-23 DIAGNOSIS — M25532 Pain in left wrist: Secondary | ICD-10-CM

## 2019-08-23 DIAGNOSIS — J3089 Other allergic rhinitis: Secondary | ICD-10-CM | POA: Diagnosis not present

## 2019-08-23 DIAGNOSIS — K219 Gastro-esophageal reflux disease without esophagitis: Secondary | ICD-10-CM | POA: Diagnosis not present

## 2019-08-23 DIAGNOSIS — G4733 Obstructive sleep apnea (adult) (pediatric): Secondary | ICD-10-CM | POA: Diagnosis not present

## 2019-08-23 DIAGNOSIS — J4520 Mild intermittent asthma, uncomplicated: Secondary | ICD-10-CM

## 2019-08-23 DIAGNOSIS — E538 Deficiency of other specified B group vitamins: Secondary | ICD-10-CM

## 2019-08-23 DIAGNOSIS — E669 Obesity, unspecified: Secondary | ICD-10-CM | POA: Diagnosis not present

## 2019-08-23 LAB — TSH: TSH: 1.87 u[IU]/mL (ref 0.35–4.50)

## 2019-08-23 LAB — CBC WITH DIFFERENTIAL/PLATELET
Basophils Absolute: 0 10*3/uL (ref 0.0–0.1)
Basophils Relative: 0.6 % (ref 0.0–3.0)
Eosinophils Absolute: 0.2 10*3/uL (ref 0.0–0.7)
Eosinophils Relative: 2.8 % (ref 0.0–5.0)
HCT: 41.7 % (ref 39.0–52.0)
Hemoglobin: 14.4 g/dL (ref 13.0–17.0)
Lymphocytes Relative: 25.4 % (ref 12.0–46.0)
Lymphs Abs: 1.4 10*3/uL (ref 0.7–4.0)
MCHC: 34.5 g/dL (ref 30.0–36.0)
MCV: 92.5 fl (ref 78.0–100.0)
Monocytes Absolute: 0.5 10*3/uL (ref 0.1–1.0)
Monocytes Relative: 8.4 % (ref 3.0–12.0)
Neutro Abs: 3.4 10*3/uL (ref 1.4–7.7)
Neutrophils Relative %: 62.8 % (ref 43.0–77.0)
Platelets: 208 10*3/uL (ref 150.0–400.0)
RBC: 4.51 Mil/uL (ref 4.22–5.81)
RDW: 12.7 % (ref 11.5–15.5)
WBC: 5.3 10*3/uL (ref 4.0–10.5)

## 2019-08-23 LAB — COMPREHENSIVE METABOLIC PANEL
ALT: 32 U/L (ref 0–53)
AST: 18 U/L (ref 0–37)
Albumin: 4.5 g/dL (ref 3.5–5.2)
Alkaline Phosphatase: 65 U/L (ref 39–117)
BUN: 18 mg/dL (ref 6–23)
CO2: 29 mEq/L (ref 19–32)
Calcium: 9.5 mg/dL (ref 8.4–10.5)
Chloride: 102 mEq/L (ref 96–112)
Creatinine, Ser: 1.08 mg/dL (ref 0.40–1.50)
GFR: 79.13 mL/min (ref >60.00)
Glucose, Bld: 104 mg/dL — ABNORMAL HIGH (ref 70–99)
Potassium: 4.7 mEq/L (ref 3.5–5.1)
Sodium: 137 mEq/L (ref 135–145)
Total Bilirubin: 0.6 mg/dL (ref 0.2–1.2)
Total Protein: 7 g/dL (ref 6.0–8.3)

## 2019-08-23 LAB — VITAMIN B12: Vitamin B-12: 240 pg/mL (ref 211–911)

## 2019-08-23 LAB — LIPID PANEL
Cholesterol: 167 mg/dL (ref 0–200)
HDL: 38.7 mg/dL — ABNORMAL LOW (ref >39.00)
LDL Cholesterol: 97 mg/dL (ref 0–99)
NonHDL: 128.3
Total CHOL/HDL Ratio: 4
Triglycerides: 156 mg/dL — ABNORMAL HIGH (ref 0.0–149.0)
VLDL: 31.2 mg/dL (ref 0.0–40.0)

## 2019-08-23 LAB — HEMOGLOBIN A1C: Hgb A1c MFr Bld: 5.1 % (ref 4.6–6.5)

## 2019-08-23 NOTE — Addendum Note (Signed)
Addended by: Elmer Picker on: 08/23/2019 08:53 AM   Modules accepted: Orders

## 2019-08-23 NOTE — Progress Notes (Signed)
Sean Allen DOB: [birth year] Encounter date: 08/23/2019  This is a 34 y.o. male who presents with Chief Complaint  Patient presents with  . Follow-up    History of present illness: Has been working out 3-4 days/week and just cannot get weight off. Not eating fried foods. Doing cross fit regularly. Does feel like he gets winded easier since having COVID although it was asymptomatic. Typically for breakfast he eats eggs, yogurt, oatmeal. Lunch - salad, sandwich, dinner - grilled chicken/steak. Eats a lot of salads - usually balsamic dressing. Limits carbs where able (ie no tortilla shells but gets fajitas). Has used my fitness pal and it recommended caloric intake at 2400 cal.   Asthma:still following with allergy/asthma. Only uses albuterol inhaler when he is working out.  sleep apnea: does wear machine every night.   last visit expressed concern for IBS: does feel a little better since starting probiotic. Noisy stomach.   Last visit expressed concern for carpal tunnel: still getting aching/pain. Other day left hand achy, swollen and actually harder to get off rings. Better on days he doesn't work out which he doesn't understand.   Feet can feel cold to him, but wife states that they are not cold. Dad has neuropathy so he worries about this.   Energy level is good overall.   GERD: protonix  Allergies  Allergen Reactions  . Ciprofloxacin     GI upset  . Dust Mite Extract   . Grass Extracts [Gramineae Pollens]   . Mold Extract [Trichophyton]   . Other     Cats    Current Meds  Medication Sig  . albuterol (PROAIR HFA) 108 (90 BASE) MCG/ACT inhaler Inhale 2 puffs into the lungs every 4 (four) hours as needed for wheezing or shortness of breath.  . Ascorbic Acid (VITAMIN C) 1000 MG tablet Take 1,000 mg by mouth daily.  . Multiple Vitamins-Minerals (MULTIVITAMIN ADULT PO) Take by mouth.  . pantoprazole (PROTONIX) 20 MG tablet Take 1 tablet (20 mg total) by mouth daily as needed.     Review of Systems  Constitutional: Negative for chills, fatigue and fever.  Respiratory: Negative for cough, chest tightness, shortness of breath and wheezing.   Cardiovascular: Negative for chest pain, palpitations and leg swelling.    Objective:  BP 120/78 (BP Location: Left Arm, Patient Position: Sitting, Cuff Size: Large)   Pulse 60   Temp (!) 97.2 F (36.2 C) (Temporal)   Ht 6' 1.25" (1.861 m)   Wt 298 lb 4.8 oz (135.3 kg)   BMI 39.09 kg/m   Weight: 298 lb 4.8 oz (135.3 kg)   BP Readings from Last 3 Encounters:  08/23/19 120/78  02/19/19 116/82  12/11/17 102/70   Wt Readings from Last 3 Encounters:  08/23/19 298 lb 4.8 oz (135.3 kg)  02/19/19 286 lb 14.4 oz (130.1 kg)  12/11/17 291 lb 14.4 oz (132.4 kg)    Physical Exam Constitutional:      General: He is not in acute distress.    Appearance: He is well-developed.  Cardiovascular:     Rate and Rhythm: Normal rate and regular rhythm.     Pulses:          Dorsalis pedis pulses are 2+ on the right side and 2+ on the left side.       Posterior tibial pulses are 2+ on the right side and 2+ on the left side.     Heart sounds: Normal heart sounds. No murmur. No friction rub.  Pulmonary:     Effort: Pulmonary effort is normal. No respiratory distress.     Breath sounds: Normal breath sounds. No wheezing or rales.  Musculoskeletal:     Right lower leg: No edema.     Left lower leg: No edema.  Feet:     Comments: Normal monofilament exam bilat feet Neurological:     Mental Status: He is alert and oriented to person, place, and time.  Psychiatric:        Behavior: Behavior normal.     Assessment/Plan  1. Mild intermittent asthma without complication Asthma well controlled.   2. Allergic rhinitis, unspecified seasonality, unspecified trigger Following with allergist  3. OSA (obstructive sleep apnea) Uses machine nightly  4. Gastroesophageal reflux disease, unspecified whether esophagitis  present Continue with protonix.   5. Obesity (BMI 35.0-39.9 without comorbidity) We are going to have him decrease caloric intake to 2000 or less daily. Keep up with exercise. Do not add back in calories lost with exercise.  6. Left wrist pain Trial CPT wrist splint.  Return for pending bloodwork.    Micheline Rough, MD

## 2019-09-03 DIAGNOSIS — J3089 Other allergic rhinitis: Secondary | ICD-10-CM | POA: Diagnosis not present

## 2019-09-03 DIAGNOSIS — J3081 Allergic rhinitis due to animal (cat) (dog) hair and dander: Secondary | ICD-10-CM | POA: Diagnosis not present

## 2019-09-08 ENCOUNTER — Other Ambulatory Visit: Payer: Self-pay | Admitting: Family Medicine

## 2019-09-08 DIAGNOSIS — K219 Gastro-esophageal reflux disease without esophagitis: Secondary | ICD-10-CM

## 2019-09-08 MED FILL — PANTOPRAZOLE SOD DR 20 MG T: 20 | 90 days supply | Qty: 90 | Fill #0

## 2019-09-10 DIAGNOSIS — J3081 Allergic rhinitis due to animal (cat) (dog) hair and dander: Secondary | ICD-10-CM | POA: Diagnosis not present

## 2019-09-10 DIAGNOSIS — J301 Allergic rhinitis due to pollen: Secondary | ICD-10-CM | POA: Diagnosis not present

## 2019-09-15 DIAGNOSIS — J3081 Allergic rhinitis due to animal (cat) (dog) hair and dander: Secondary | ICD-10-CM | POA: Diagnosis not present

## 2019-09-15 DIAGNOSIS — G4733 Obstructive sleep apnea (adult) (pediatric): Secondary | ICD-10-CM | POA: Diagnosis not present

## 2019-09-15 DIAGNOSIS — J3089 Other allergic rhinitis: Secondary | ICD-10-CM | POA: Diagnosis not present

## 2019-09-21 DIAGNOSIS — J3089 Other allergic rhinitis: Secondary | ICD-10-CM | POA: Diagnosis not present

## 2019-09-21 DIAGNOSIS — J3081 Allergic rhinitis due to animal (cat) (dog) hair and dander: Secondary | ICD-10-CM | POA: Diagnosis not present

## 2019-09-23 DIAGNOSIS — J3081 Allergic rhinitis due to animal (cat) (dog) hair and dander: Secondary | ICD-10-CM | POA: Diagnosis not present

## 2019-09-23 DIAGNOSIS — J3089 Other allergic rhinitis: Secondary | ICD-10-CM | POA: Diagnosis not present

## 2019-10-14 DIAGNOSIS — J3089 Other allergic rhinitis: Secondary | ICD-10-CM | POA: Diagnosis not present

## 2019-10-14 DIAGNOSIS — J3081 Allergic rhinitis due to animal (cat) (dog) hair and dander: Secondary | ICD-10-CM | POA: Diagnosis not present

## 2019-10-14 MED FILL — EPINEPHRINE 0.3 MG AUTO-INJ: 0.3 | 30 days supply | Qty: 2 | Fill #0

## 2019-10-18 ENCOUNTER — Telehealth: Payer: 59 | Admitting: Physician Assistant

## 2019-10-18 DIAGNOSIS — J019 Acute sinusitis, unspecified: Secondary | ICD-10-CM

## 2019-10-18 MED ORDER — AZITHROMYCIN 250 MG PO TABS: ORAL_TABLET | ORAL | 0 refills | Status: AC

## 2019-10-18 NOTE — Progress Notes (Signed)
We are sorry that you are not feeling well.  Here is how we plan to help!  Based on what you have shared with me it looks like you have sinusitis.  Sinusitis is inflammation and infection in the sinus cavities of the head.  Based on your presentation I believe you most likely have Acute Bacterial Sinusitis.  This is an infection caused by bacteria and is treated with antibiotics. I have prescribed Zpack since this has helped in the past.  Azithromycin (zpack) for sinusitis can work; however, in some cases, this treatment is ineffective You may use an oral decongestant such as Mucinex D or if you have glaucoma or high blood pressure use plain Mucinex. Saline nasal spray help and can safely be used as often as needed for congestion.  If you develop worsening sinus pain, fever or notice severe headache and vision changes, or if symptoms are not better after completion of antibiotic, please schedule an appointment with a health care provider.    Sinus infections are not as easily transmitted as other respiratory infection, however we still recommend that you avoid close contact with loved ones, especially the very young and elderly.  Remember to wash your hands thoroughly throughout the day as this is the number one way to prevent the spread of infection!  Home Care:  Only take medications as instructed by your medical team.  Complete the entire course of an antibiotic.  Do not take these medications with alcohol.  A steam or ultrasonic humidifier can help congestion.  You can place a towel over your head and breathe in the steam from hot water coming from a faucet.  Avoid close contacts especially the very young and the elderly.  Cover your mouth when you cough or sneeze.  Always remember to wash your hands.  Get Help Right Away If:  You develop worsening fever or sinus pain.  You develop a severe head ache or visual changes.  Your symptoms persist after you have completed your treatment  plan.  Make sure you  Understand these instructions.  Will watch your condition.  Will get help right away if you are not doing well or get worse.  Your e-visit answers were reviewed by a board certified advanced clinical practitioner to complete your personal care plan.  Depending on the condition, your plan could have included both over the counter or prescription medications.  If there is a problem please reply  once you have received a response from your provider.  Your safety is important to [place].  If you have drug allergies check your prescription carefully.    You can use MyChart to ask questions about today's visit, request a non-urgent call back, or ask for a work or school excuse for 24 hours related to this e-Visit. If it has been greater than 24 hours you will need to follow up with your provider, or enter a new e-Visit to address those concerns.  You will get an e-mail in the next two days asking about your experience.  I hope that your e-visit has been valuable and will speed your recovery. Thank you for using e-visits.  5 minutes on chart

## 2019-10-19 DIAGNOSIS — J3081 Allergic rhinitis due to animal (cat) (dog) hair and dander: Secondary | ICD-10-CM | POA: Diagnosis not present

## 2019-10-19 DIAGNOSIS — J3089 Other allergic rhinitis: Secondary | ICD-10-CM | POA: Diagnosis not present

## 2019-11-02 DIAGNOSIS — J3089 Other allergic rhinitis: Secondary | ICD-10-CM | POA: Diagnosis not present

## 2019-11-02 DIAGNOSIS — J3081 Allergic rhinitis due to animal (cat) (dog) hair and dander: Secondary | ICD-10-CM | POA: Diagnosis not present

## 2019-11-05 ENCOUNTER — Telehealth: Payer: 59 | Admitting: Family

## 2019-11-05 DIAGNOSIS — L03311 Cellulitis of abdominal wall: Secondary | ICD-10-CM

## 2019-11-05 MED ORDER — DOXYCYCLINE HYCLATE 100 MG PO TABS
100.0000 mg | ORAL_TABLET | Freq: Two times a day (BID) | ORAL | 0 refills | Status: DC
Start: 2019-11-05 — End: 2020-02-07

## 2019-11-05 NOTE — Progress Notes (Signed)
E Visit for Cellulitis  We are sorry that you are not feeling well. Here is how we plan to help!  Based on what you shared with me it looks like you have cellulitis as a result of your tick bite.  Cellulitis looks like areas of skin redness, swelling, and warmth; it develops as a result of bacteria entering under the skin. Little red spots and/or bleeding can be seen in skin, and tiny surface sacs containing fluid can occur. Fever can be present. Cellulitis is almost always on one side of a body, and the lower limbs are the most common site of involvement.   I have prescribed:  Doxycycline 100 mg twice a day x 7 days  HOME CARE:   Take your medications as ordered and take all of them, even if the skin irritation appears to be healing.   GET HELP RIGHT AWAY IF:   Symptoms that don't begin to go away within 48 hours.  Severe redness persists or worsens  If the area turns color, spreads or swells.  If it blisters and opens, develops yellow-brown crust or bleeds.  You develop a fever or chills.  If the pain increases or becomes unbearable.   Are unable to keep fluids and food down.  MAKE SURE YOU    Understand these instructions.  Will watch your condition.  Will get help right away if you are not doing well or get worse.  Thank you for choosing an e-visit. Your e-visit answers were reviewed by a board certified advanced clinical practitioner to complete your personal care plan. Depending upon the condition, your plan could have included both over the counter or prescription medications. Please review your pharmacy choice. Make sure the pharmacy is open so you can pick up prescription now. If there is a problem, you may contact your provider through [employer] and have the prescription routed to another pharmacy. Your safety is important to [place]. If you have drug allergies check your prescription carefully.  For the next 24 hours you can use MyChart to ask questions about  today's visit, request a non-urgent call back, or ask for a work or school excuse. You will get an email in the next two days asking about your experience. I hope that your e-visit has been valuable and will speed your recovery.  Greater than 5 minutes, yet less than 10 minutes of time have been spent researching, coordinating, and implementing care for this patient today.  Thank you for the details you included in the comment boxes. Those details are very helpful in determining the best course of treatment for you and help [place] to provide the best care.

## 2019-11-09 DIAGNOSIS — J3081 Allergic rhinitis due to animal (cat) (dog) hair and dander: Secondary | ICD-10-CM | POA: Diagnosis not present

## 2019-11-09 DIAGNOSIS — J3089 Other allergic rhinitis: Secondary | ICD-10-CM | POA: Diagnosis not present

## 2019-11-22 DIAGNOSIS — J3081 Allergic rhinitis due to animal (cat) (dog) hair and dander: Secondary | ICD-10-CM | POA: Diagnosis not present

## 2019-11-22 DIAGNOSIS — J3089 Other allergic rhinitis: Secondary | ICD-10-CM | POA: Diagnosis not present

## 2019-11-26 DIAGNOSIS — J3081 Allergic rhinitis due to animal (cat) (dog) hair and dander: Secondary | ICD-10-CM | POA: Diagnosis not present

## 2019-11-26 DIAGNOSIS — J3089 Other allergic rhinitis: Secondary | ICD-10-CM | POA: Diagnosis not present

## 2019-11-29 DIAGNOSIS — J3081 Allergic rhinitis due to animal (cat) (dog) hair and dander: Secondary | ICD-10-CM | POA: Diagnosis not present

## 2019-11-29 DIAGNOSIS — J3089 Other allergic rhinitis: Secondary | ICD-10-CM | POA: Diagnosis not present

## 2019-12-01 ENCOUNTER — Ambulatory Visit: Payer: Self-pay | Admitting: Family Medicine

## 2019-12-03 DIAGNOSIS — J3089 Other allergic rhinitis: Secondary | ICD-10-CM | POA: Diagnosis not present

## 2019-12-03 DIAGNOSIS — J3081 Allergic rhinitis due to animal (cat) (dog) hair and dander: Secondary | ICD-10-CM | POA: Diagnosis not present

## 2019-12-03 DIAGNOSIS — J301 Allergic rhinitis due to pollen: Secondary | ICD-10-CM | POA: Diagnosis not present

## 2019-12-09 DIAGNOSIS — J3089 Other allergic rhinitis: Secondary | ICD-10-CM | POA: Diagnosis not present

## 2019-12-09 DIAGNOSIS — J301 Allergic rhinitis due to pollen: Secondary | ICD-10-CM | POA: Diagnosis not present

## 2019-12-09 DIAGNOSIS — J3081 Allergic rhinitis due to animal (cat) (dog) hair and dander: Secondary | ICD-10-CM | POA: Diagnosis not present

## 2019-12-13 DIAGNOSIS — G4733 Obstructive sleep apnea (adult) (pediatric): Secondary | ICD-10-CM | POA: Diagnosis not present

## 2019-12-16 MED FILL — PANTOPRAZOLE SOD DR 20 MG T: 20 | 90 days supply | Qty: 90 | Fill #1

## 2019-12-17 DIAGNOSIS — J3089 Other allergic rhinitis: Secondary | ICD-10-CM | POA: Diagnosis not present

## 2019-12-17 DIAGNOSIS — J3081 Allergic rhinitis due to animal (cat) (dog) hair and dander: Secondary | ICD-10-CM | POA: Diagnosis not present

## 2019-12-21 DIAGNOSIS — J301 Allergic rhinitis due to pollen: Secondary | ICD-10-CM | POA: Diagnosis not present

## 2019-12-21 DIAGNOSIS — J3089 Other allergic rhinitis: Secondary | ICD-10-CM | POA: Diagnosis not present

## 2019-12-21 DIAGNOSIS — J3081 Allergic rhinitis due to animal (cat) (dog) hair and dander: Secondary | ICD-10-CM | POA: Diagnosis not present

## 2019-12-28 DIAGNOSIS — J301 Allergic rhinitis due to pollen: Secondary | ICD-10-CM | POA: Diagnosis not present

## 2019-12-28 DIAGNOSIS — J3089 Other allergic rhinitis: Secondary | ICD-10-CM | POA: Diagnosis not present

## 2019-12-28 DIAGNOSIS — J3081 Allergic rhinitis due to animal (cat) (dog) hair and dander: Secondary | ICD-10-CM | POA: Diagnosis not present

## 2020-01-03 ENCOUNTER — Ambulatory Visit: Payer: Self-pay | Admitting: Family Medicine

## 2020-01-04 DIAGNOSIS — J3089 Other allergic rhinitis: Secondary | ICD-10-CM | POA: Diagnosis not present

## 2020-01-04 DIAGNOSIS — J3081 Allergic rhinitis due to animal (cat) (dog) hair and dander: Secondary | ICD-10-CM | POA: Diagnosis not present

## 2020-01-19 DIAGNOSIS — J3089 Other allergic rhinitis: Secondary | ICD-10-CM | POA: Diagnosis not present

## 2020-01-19 DIAGNOSIS — J3081 Allergic rhinitis due to animal (cat) (dog) hair and dander: Secondary | ICD-10-CM | POA: Diagnosis not present

## 2020-01-20 ENCOUNTER — Encounter: Payer: Self-pay | Admitting: Family Medicine

## 2020-01-31 DIAGNOSIS — J3089 Other allergic rhinitis: Secondary | ICD-10-CM | POA: Diagnosis not present

## 2020-01-31 DIAGNOSIS — J3081 Allergic rhinitis due to animal (cat) (dog) hair and dander: Secondary | ICD-10-CM | POA: Diagnosis not present

## 2020-02-04 ENCOUNTER — Ambulatory Visit: Payer: Self-pay | Admitting: Family Medicine

## 2020-02-07 ENCOUNTER — Ambulatory Visit (INDEPENDENT_AMBULATORY_CARE_PROVIDER_SITE_OTHER): Payer: 59

## 2020-02-07 ENCOUNTER — Other Ambulatory Visit: Payer: Self-pay

## 2020-02-07 ENCOUNTER — Ambulatory Visit: Payer: 59 | Admitting: Family Medicine

## 2020-02-07 ENCOUNTER — Encounter: Payer: Self-pay | Admitting: Family Medicine

## 2020-02-07 VITALS — BP 124/78 | HR 64 | Temp 98.6°F | Ht 73.25 in | Wt 290.7 lb

## 2020-02-07 DIAGNOSIS — G4733 Obstructive sleep apnea (adult) (pediatric): Secondary | ICD-10-CM

## 2020-02-07 DIAGNOSIS — K219 Gastro-esophageal reflux disease without esophagitis: Secondary | ICD-10-CM

## 2020-02-07 DIAGNOSIS — J4520 Mild intermittent asthma, uncomplicated: Secondary | ICD-10-CM | POA: Diagnosis not present

## 2020-02-07 DIAGNOSIS — E538 Deficiency of other specified B group vitamins: Secondary | ICD-10-CM | POA: Diagnosis not present

## 2020-02-07 DIAGNOSIS — M25572 Pain in left ankle and joints of left foot: Secondary | ICD-10-CM | POA: Diagnosis not present

## 2020-02-07 DIAGNOSIS — J3081 Allergic rhinitis due to animal (cat) (dog) hair and dander: Secondary | ICD-10-CM | POA: Diagnosis not present

## 2020-02-07 DIAGNOSIS — J3089 Other allergic rhinitis: Secondary | ICD-10-CM | POA: Diagnosis not present

## 2020-02-07 NOTE — Progress Notes (Signed)
Sean Allen DOB: 1985/12/22 Encounter date: 02/07/2020  This is a 34 y.o. male who presents with Chief Complaint  Patient presents with  . Follow-up    History of present illness:  2000 cal/day has been hard, but he is pleased with 8lb weight loss. Switched to [employer] due to cleanliness concerns. Doing own work outs.   Has mole on right shoulder he wants looked at. Not sure how long it has been there. Saw it in mirror when he got out of shower.   A month ago tripped and fell in garage and hurt ankle. Still swells. Thought he broke it. Still sore to touch on top of ankle. Not sure how he injured it. Was taking out trash going down steps. Pulled self forward and then up and when he put weight on it he could put weight and walk on foot. Turned blue on bottom of foot.   Asthma:still following with allergy/asthma. Only uses albuterol inhaler when he is working out. Doing well overall. Allergy shots have worked well for him. Hasn't needed inhaler as much because there is air conditioning in current gym.   sleep apnea: does wear machine every night. still good with this.   GERD: protonix 20mg  regularly; notes if he doesn't take medicine.   Wearing carpal tunnel brace on left has solved problems; not wearing during day. Notes if he doesn' wear at night.   Feet sometimes still get cold, but feel better since starting B12.   Allergies  Allergen Reactions  . Ciprofloxacin     GI upset  . Dust Mite Extract   . Grass Extracts [Gramineae Pollens]   . Mold Extract [Trichophyton]   . Other     Cats    Current Meds  Medication Sig  . albuterol (PROAIR HFA) 108 (90 BASE) MCG/ACT inhaler Inhale 2 puffs into the lungs every 4 (four) hours as needed for wheezing or shortness of breath.  . Ascorbic Acid (VITAMIN C) 1000 MG tablet Take 1,000 mg by mouth daily.  . Multiple Vitamins-Minerals (MULTIVITAMIN ADULT PO) Take by mouth.  . pantoprazole (PROTONIX) 20 MG tablet TAKE 1 TABLET (20  MG TOTAL) BY MOUTH DAILY AS NEEDED.    Review of Systems  Constitutional: Negative for chills, fatigue and fever.  Respiratory: Negative for cough, chest tightness, shortness of breath and wheezing.   Cardiovascular: Negative for chest pain, palpitations and leg swelling.    Objective:  BP 124/78 (BP Location: Left Arm, Patient Position: Sitting, Cuff Size: Large)   Pulse 64   Temp 98.6 F (37 C) (Oral)   Ht 6' 1.25" (1.861 m)   Wt 290 lb 11.2 oz (131.9 kg)   BMI 38.09 kg/m   Weight: 290 lb 11.2 oz (131.9 kg)   BP Readings from Last 3 Encounters:  02/07/20 124/78  08/23/19 120/78  02/19/19 116/82   Wt Readings from Last 3 Encounters:  02/07/20 290 lb 11.2 oz (131.9 kg)  08/23/19 298 lb 4.8 oz (135.3 kg)  02/19/19 286 lb 14.4 oz (130.1 kg)    Physical Exam Constitutional:      General: He is not in acute distress.    Appearance: He is well-developed.  Cardiovascular:     Rate and Rhythm: Normal rate and regular rhythm.     Heart sounds: Normal heart sounds. No murmur heard.  No friction rub.  Pulmonary:     Effort: Pulmonary effort is normal. No respiratory distress.     Breath sounds: Normal breath sounds. No  wheezing or rales.  Musculoskeletal:     Right lower leg: No edema.     Left lower leg: No edema.     Comments: Anterior edema that does not easily compress left ankle. Tenderness lateral malleolus and anterior ankle. No residual bruising. No other tenderness noted on exam. No obvious laxity.  Skin:    Comments: Dark mole right upper shoulder; see pic: 90mm  Neurological:     Mental Status: He is alert and oriented to person, place, and time.  Psychiatric:        Behavior: Behavior normal.     Assessment/Plan 1. Mild intermittent asthma without complication Doing well with allergy shots. Rarely needing albuterol  2. OSA (obstructive sleep apnea) Wears cpap regularly; does well with this.  3. Gastroesophageal reflux disease, unspecified whether  esophagitis present protonix regularly; helps with silent reflux as well.  4. Left ankle pain, unspecified chronicity Concerned for edema anteriorly. Would like xray to make sure we do not need any further treatment to aid with healing. - DG Ankle Complete Left; Future  5. Low serum vitamin B12 He is feeling better with supplementation. We discussed it is fine to wait until next year to repeat bloodwork since all labs were stable in may.  Return for for scheduled physical.     Micheline Rough, MD

## 2020-02-16 DIAGNOSIS — J3089 Other allergic rhinitis: Secondary | ICD-10-CM | POA: Diagnosis not present

## 2020-02-16 DIAGNOSIS — J3081 Allergic rhinitis due to animal (cat) (dog) hair and dander: Secondary | ICD-10-CM | POA: Diagnosis not present

## 2020-02-22 DIAGNOSIS — J3089 Other allergic rhinitis: Secondary | ICD-10-CM | POA: Diagnosis not present

## 2020-02-22 DIAGNOSIS — J3081 Allergic rhinitis due to animal (cat) (dog) hair and dander: Secondary | ICD-10-CM | POA: Diagnosis not present

## 2020-02-23 ENCOUNTER — Ambulatory Visit (INDEPENDENT_AMBULATORY_CARE_PROVIDER_SITE_OTHER): Payer: 59 | Admitting: Family Medicine

## 2020-02-23 ENCOUNTER — Other Ambulatory Visit: Payer: Self-pay

## 2020-02-23 ENCOUNTER — Encounter: Payer: Self-pay | Admitting: Family Medicine

## 2020-02-23 VITALS — BP 118/90 | HR 72 | Temp 98.3°F | Ht 73.75 in | Wt 289.5 lb

## 2020-02-23 DIAGNOSIS — R030 Elevated blood-pressure reading, without diagnosis of hypertension: Secondary | ICD-10-CM | POA: Diagnosis not present

## 2020-02-23 DIAGNOSIS — E669 Obesity, unspecified: Secondary | ICD-10-CM | POA: Diagnosis not present

## 2020-02-23 DIAGNOSIS — Z0000 Encounter for general adult medical examination without abnormal findings: Secondary | ICD-10-CM | POA: Diagnosis not present

## 2020-02-23 NOTE — Progress Notes (Signed)
REA RESER DOB: 27-Jun-1985 Encounter date: 02/23/2020  This is a 34 y.o. male who presents for complete physical   History of present illness/Additional concerns:   Past Medical History:  Diagnosis Date  . Allergic rhinitis 08/30/2013  . Asthma   . GERD (gastroesophageal reflux disease)   . Hx of retinal detachment - [facility] 08/30/2013   bilat  . OSA (obstructive sleep apnea) 02/14/2015  . Varicocele, evaluated by urologist in the past 08/30/2013   Past Surgical History:  Procedure Laterality Date  . EYE SURGERY     bilateral retinal detachment  . TESTICLE SURGERY Right   . TONSILLECTOMY AND ADENOIDECTOMY     Allergies  Allergen Reactions  . Ciprofloxacin     GI upset  . Dust Mite Extract   . Grass Extracts [Gramineae Pollens]   . Mold Extract [Trichophyton]   . Other     Cats    Current Meds  Medication Sig  . albuterol (PROAIR HFA) 108 (90 BASE) MCG/ACT inhaler Inhale 2 puffs into the lungs every 4 (four) hours as needed for wheezing or shortness of breath.  . Ascorbic Acid (VITAMIN C) 1000 MG tablet Take 1,000 mg by mouth daily.  . Multiple Vitamins-Minerals (MULTIVITAMIN ADULT PO) Take by mouth.  . pantoprazole (PROTONIX) 20 MG tablet TAKE 1 TABLET (20 MG TOTAL) BY MOUTH DAILY AS NEEDED.   Social History   Tobacco Use  . Smoking status: Never Smoker  . Smokeless tobacco: Never Used  Substance Use Topics  . Alcohol use: Yes    Alcohol/week: 0.0 standard drinks    Comment: occ 1-2 drinks   Family History  Problem Relation Age of Onset  . Hyperlipidemia Father   . Other Father        prostate enlargement  . Sleep apnea Father   . Lung disease Father        asbestosis  . Diabetes Father        prediabetes  . Hypertension Paternal Grandmother   . Heart disease Paternal Grandmother   . Heart attack Paternal Grandmother   . High Cholesterol Paternal Grandmother   . Arthritis Mother        neck, knees  . Bradycardia Mother   . Heart  disease Maternal Grandmother   . Lymphoma Maternal Grandmother   . Kidney failure Maternal Grandmother   . Heart disease Maternal Grandfather   . Kidney failure Maternal Grandfather   . Diabetes Maternal Grandfather   . Heart disease Paternal Grandfather   . Heart attack Paternal Grandfather   . Prostate cancer Paternal Grandfather 33  . Healthy Sister        fatty liver  . Prostate cancer Paternal Great-grandfather   . Diabetes Paternal Aunt      Review of Systems  Constitutional: Negative for activity change, appetite change, chills, fatigue, fever and unexpected weight change.  HENT: Negative for congestion, ear pain, hearing loss, sinus pressure, sinus pain, sore throat and trouble swallowing.   Eyes: Negative for pain and visual disturbance.  Respiratory: Negative for cough, chest tightness, shortness of breath and wheezing.   Cardiovascular: Negative for chest pain, palpitations and leg swelling.  Gastrointestinal: Negative for abdominal distention, abdominal pain, blood in stool, constipation, diarrhea, nausea and vomiting.  Genitourinary: Negative for decreased urine volume, difficulty urinating, dysuria, penile pain and testicular pain.  Musculoskeletal: Negative for arthralgias, back pain and joint swelling.  Skin: Negative for rash.  Neurological: Negative for dizziness, weakness, numbness and headaches.  Hematological:  Negative for adenopathy. Does not bruise/bleed easily.  Psychiatric/Behavioral: Negative for agitation, sleep disturbance and suicidal ideas. The patient is not nervous/anxious.     CBC:  Lab Results  Component Value Date   WBC 5.3 08/23/2019   HGB 14.4 08/23/2019   HCT 41.7 08/23/2019   MCHC 34.5 08/23/2019   RDW 12.7 08/23/2019   PLT 208.0 08/23/2019   CMP: Lab Results  Component Value Date   NA 137 08/23/2019   K 4.7 08/23/2019   CL 102 08/23/2019   CO2 29 08/23/2019   GLUCOSE 104 (H) 08/23/2019   BUN 18 08/23/2019   CREATININE 1.08  08/23/2019   CALCIUM 9.5 08/23/2019   PROT 7.0 08/23/2019   BILITOT 0.6 08/23/2019   ALKPHOS 65 08/23/2019   ALT 32 08/23/2019   AST 18 08/23/2019   LIPID: Lab Results  Component Value Date   CHOL 167 08/23/2019   TRIG 156.0 (H) 08/23/2019   HDL 38.70 (L) 08/23/2019   LDLCALC 97 08/23/2019    Objective:  Pulse 72   Temp 98.3 F (36.8 C) (Oral)   Ht 6' 1.75" (1.873 m)   Wt 289 lb 8 oz (131.3 kg)   SpO2 96%   BMI 37.42 kg/m   Weight: 289 lb 8 oz (131.3 kg)   BP Readings from Last 3 Encounters:  02/07/20 124/78  08/23/19 120/78  02/19/19 116/82   Wt Readings from Last 3 Encounters:  02/23/20 289 lb 8 oz (131.3 kg)  02/07/20 290 lb 11.2 oz (131.9 kg)  08/23/19 298 lb 4.8 oz (135.3 kg)    Physical Exam Constitutional:      General: He is not in acute distress.    Appearance: He is well-developed.  HENT:     Head: Normocephalic and atraumatic.     Right Ear: External ear normal.     Left Ear: External ear normal.     Nose: Nose normal.     Mouth/Throat:     Pharynx: No oropharyngeal exudate.  Eyes:     Conjunctiva/sclera: Conjunctivae normal.     Pupils: Pupils are equal, round, and reactive to light.  Neck:     Thyroid: No thyromegaly.  Cardiovascular:     Rate and Rhythm: Normal rate and regular rhythm.     Heart sounds: Normal heart sounds. No murmur heard.  No friction rub. No gallop.   Pulmonary:     Effort: Pulmonary effort is normal. No respiratory distress.     Breath sounds: Normal breath sounds. No stridor. No wheezing or rales.  Abdominal:     General: Bowel sounds are normal.     Palpations: Abdomen is soft.  Genitourinary:    Penis: Normal and circumcised.      Testes: Normal.        Right: Mass, tenderness or swelling not present.        Left: Mass, tenderness or swelling not present.     Epididymis:     Right: Normal.     Left: Normal.  Musculoskeletal:        General: Normal range of motion.     Cervical back: Neck supple.  Skin:     General: Skin is warm and dry.  Neurological:     Mental Status: He is alert and oriented to person, place, and time.  Psychiatric:        Behavior: Behavior normal.        Thought Content: Thought content normal.        Judgment: Judgment  normal.     Assessment/Plan: There are no preventive care reminders to display for this patient. Health Maintenance reviewed  1. Preventative health care He is working on healthy eating and regular exercise. He continues to loose weight.  2. Obesity (BMI 35.0-39.9 without comorbidity) See above. I have asked him to return if weight loss plateaus.   3.  Elevated blood pressure reading Patient admits to heavy salt use and very high salt dinner last night.  Encouraged him to cut back on this.  Last blood pressure checked in the office was normal.  I expect blood pressure to continue to improve with weight loss and healthier eating.  I have asked him to check at home and report back to me in 2 weeks.  Return in about 1 year (around 02/22/2021) for physical exam.  Micheline Rough, MD

## 2020-03-03 DIAGNOSIS — J3081 Allergic rhinitis due to animal (cat) (dog) hair and dander: Secondary | ICD-10-CM | POA: Diagnosis not present

## 2020-03-03 DIAGNOSIS — J3089 Other allergic rhinitis: Secondary | ICD-10-CM | POA: Diagnosis not present

## 2020-03-10 DIAGNOSIS — G4733 Obstructive sleep apnea (adult) (pediatric): Secondary | ICD-10-CM | POA: Diagnosis not present

## 2020-03-13 DIAGNOSIS — J3081 Allergic rhinitis due to animal (cat) (dog) hair and dander: Secondary | ICD-10-CM | POA: Diagnosis not present

## 2020-03-13 DIAGNOSIS — J3089 Other allergic rhinitis: Secondary | ICD-10-CM | POA: Diagnosis not present

## 2020-03-13 DIAGNOSIS — J301 Allergic rhinitis due to pollen: Secondary | ICD-10-CM | POA: Diagnosis not present

## 2020-03-23 DIAGNOSIS — J3089 Other allergic rhinitis: Secondary | ICD-10-CM | POA: Diagnosis not present

## 2020-03-23 DIAGNOSIS — J301 Allergic rhinitis due to pollen: Secondary | ICD-10-CM | POA: Diagnosis not present

## 2020-03-23 DIAGNOSIS — J3081 Allergic rhinitis due to animal (cat) (dog) hair and dander: Secondary | ICD-10-CM | POA: Diagnosis not present

## 2020-03-27 ENCOUNTER — Other Ambulatory Visit: Payer: Self-pay | Admitting: Family Medicine

## 2020-03-27 DIAGNOSIS — K219 Gastro-esophageal reflux disease without esophagitis: Secondary | ICD-10-CM

## 2020-03-28 ENCOUNTER — Other Ambulatory Visit: Payer: Self-pay | Admitting: Family Medicine

## 2020-03-28 DIAGNOSIS — J301 Allergic rhinitis due to pollen: Secondary | ICD-10-CM | POA: Diagnosis not present

## 2020-03-28 DIAGNOSIS — J3081 Allergic rhinitis due to animal (cat) (dog) hair and dander: Secondary | ICD-10-CM | POA: Diagnosis not present

## 2020-03-28 DIAGNOSIS — J3089 Other allergic rhinitis: Secondary | ICD-10-CM | POA: Diagnosis not present

## 2020-03-28 MED FILL — PANTOPRAZOLE SOD DR 20 MG T: 20 | 90 days supply | Qty: 90 | Fill #0

## 2020-04-05 DIAGNOSIS — J3089 Other allergic rhinitis: Secondary | ICD-10-CM | POA: Diagnosis not present

## 2020-04-05 DIAGNOSIS — J3081 Allergic rhinitis due to animal (cat) (dog) hair and dander: Secondary | ICD-10-CM | POA: Diagnosis not present

## 2020-04-06 DIAGNOSIS — J3081 Allergic rhinitis due to animal (cat) (dog) hair and dander: Secondary | ICD-10-CM | POA: Diagnosis not present

## 2020-04-10 ENCOUNTER — Encounter: Payer: Self-pay | Admitting: Family Medicine

## 2020-04-12 DIAGNOSIS — J3081 Allergic rhinitis due to animal (cat) (dog) hair and dander: Secondary | ICD-10-CM | POA: Diagnosis not present

## 2020-04-12 DIAGNOSIS — J3089 Other allergic rhinitis: Secondary | ICD-10-CM | POA: Diagnosis not present

## 2020-04-12 MED ORDER — OSELTAMIVIR PHOSPHATE 75 MG PO CAPS
75.0000 mg | ORAL_CAPSULE | Freq: Every day | ORAL | 0 refills | Status: AC
Start: 2020-04-12 — End: 2020-04-19

## 2020-04-24 DIAGNOSIS — J3081 Allergic rhinitis due to animal (cat) (dog) hair and dander: Secondary | ICD-10-CM | POA: Diagnosis not present

## 2020-04-24 DIAGNOSIS — J3089 Other allergic rhinitis: Secondary | ICD-10-CM | POA: Diagnosis not present

## 2020-05-03 DIAGNOSIS — J3081 Allergic rhinitis due to animal (cat) (dog) hair and dander: Secondary | ICD-10-CM | POA: Diagnosis not present

## 2020-05-03 DIAGNOSIS — J3089 Other allergic rhinitis: Secondary | ICD-10-CM | POA: Diagnosis not present

## 2020-05-03 DIAGNOSIS — J301 Allergic rhinitis due to pollen: Secondary | ICD-10-CM | POA: Diagnosis not present

## 2020-05-09 DIAGNOSIS — J3081 Allergic rhinitis due to animal (cat) (dog) hair and dander: Secondary | ICD-10-CM | POA: Diagnosis not present

## 2020-05-09 DIAGNOSIS — J3089 Other allergic rhinitis: Secondary | ICD-10-CM | POA: Diagnosis not present

## 2020-05-11 DIAGNOSIS — J301 Allergic rhinitis due to pollen: Secondary | ICD-10-CM | POA: Diagnosis not present

## 2020-05-11 DIAGNOSIS — J3081 Allergic rhinitis due to animal (cat) (dog) hair and dander: Secondary | ICD-10-CM | POA: Diagnosis not present

## 2020-05-11 DIAGNOSIS — J3089 Other allergic rhinitis: Secondary | ICD-10-CM | POA: Diagnosis not present

## 2020-05-17 DIAGNOSIS — J3081 Allergic rhinitis due to animal (cat) (dog) hair and dander: Secondary | ICD-10-CM | POA: Diagnosis not present

## 2020-05-17 DIAGNOSIS — J3089 Other allergic rhinitis: Secondary | ICD-10-CM | POA: Diagnosis not present

## 2020-05-22 DIAGNOSIS — J3089 Other allergic rhinitis: Secondary | ICD-10-CM | POA: Diagnosis not present

## 2020-05-22 DIAGNOSIS — J3081 Allergic rhinitis due to animal (cat) (dog) hair and dander: Secondary | ICD-10-CM | POA: Diagnosis not present

## 2020-05-30 DIAGNOSIS — J3081 Allergic rhinitis due to animal (cat) (dog) hair and dander: Secondary | ICD-10-CM | POA: Diagnosis not present

## 2020-05-30 DIAGNOSIS — J3089 Other allergic rhinitis: Secondary | ICD-10-CM | POA: Diagnosis not present

## 2020-06-05 DIAGNOSIS — J3081 Allergic rhinitis due to animal (cat) (dog) hair and dander: Secondary | ICD-10-CM | POA: Diagnosis not present

## 2020-06-05 DIAGNOSIS — G4733 Obstructive sleep apnea (adult) (pediatric): Secondary | ICD-10-CM | POA: Diagnosis not present

## 2020-06-05 DIAGNOSIS — J3089 Other allergic rhinitis: Secondary | ICD-10-CM | POA: Diagnosis not present

## 2020-06-05 DIAGNOSIS — J301 Allergic rhinitis due to pollen: Secondary | ICD-10-CM | POA: Diagnosis not present

## 2020-06-14 DIAGNOSIS — J301 Allergic rhinitis due to pollen: Secondary | ICD-10-CM | POA: Diagnosis not present

## 2020-06-14 DIAGNOSIS — J3089 Other allergic rhinitis: Secondary | ICD-10-CM | POA: Diagnosis not present

## 2020-06-14 DIAGNOSIS — J3081 Allergic rhinitis due to animal (cat) (dog) hair and dander: Secondary | ICD-10-CM | POA: Diagnosis not present

## 2020-06-22 DIAGNOSIS — J3081 Allergic rhinitis due to animal (cat) (dog) hair and dander: Secondary | ICD-10-CM | POA: Diagnosis not present

## 2020-06-22 DIAGNOSIS — J3089 Other allergic rhinitis: Secondary | ICD-10-CM | POA: Diagnosis not present

## 2020-06-22 DIAGNOSIS — H52223 Regular astigmatism, bilateral: Secondary | ICD-10-CM | POA: Diagnosis not present

## 2020-06-22 DIAGNOSIS — H5213 Myopia, bilateral: Secondary | ICD-10-CM | POA: Diagnosis not present

## 2020-06-22 DIAGNOSIS — J301 Allergic rhinitis due to pollen: Secondary | ICD-10-CM | POA: Diagnosis not present

## 2020-06-23 ENCOUNTER — Telehealth: Payer: 59 | Admitting: Physician Assistant

## 2020-06-23 DIAGNOSIS — J019 Acute sinusitis, unspecified: Secondary | ICD-10-CM | POA: Diagnosis not present

## 2020-06-23 MED ORDER — AMOXICILLIN-POT CLAVULANATE 875-125 MG PO TABS
1.0000 | ORAL_TABLET | Freq: Two times a day (BID) | ORAL | 0 refills | Status: AC
Start: 2020-06-23 — End: 2020-06-30

## 2020-06-23 MED ORDER — AMOXICILLIN-POT CLAVULANATE 875-125 MG PO TABS
1.0000 | ORAL_TABLET | Freq: Two times a day (BID) | ORAL | 0 refills | Status: DC
Start: 2020-06-23 — End: 2020-06-23

## 2020-06-23 MED ORDER — FLUTICASONE PROPIONATE 50 MCG/ACT NA SUSP
2.0000 | Freq: Every day | NASAL | 0 refills | Status: DC
Start: 2020-06-23 — End: 2020-10-24

## 2020-06-23 NOTE — Progress Notes (Signed)
We are sorry that you are not feeling well.  Here is how we plan to help!  Based on what you have shared with me it looks like you have sinusitis.  Sinusitis is inflammation and infection in the sinus cavities of the head.  Based on your presentation I believe you most likely have Acute Viral Sinusitis.This is an infection most likely caused by a virus. There is not specific treatment for viral sinusitis other than to help you with the symptoms until the infection runs its course.  You may use an oral decongestant such as Mucinex D or if you have glaucoma or high blood pressure use plain Mucinex. Saline nasal spray help and can safely be used as often as needed for congestion, I have prescribed: Fluticasone nasal spray two sprays in each nostril once a day   Sometimes viral sinus infections can progress into bacterial sinus infections. Usually this only occurs after symptoms have been present for 1 week or if you develop a fever. If your symptoms persist past 7 days or if you develop a fever, I have prescribed an antibiotic, Augmentin, to take twice daily for your symptoms. Please monitor symptoms and do not fill the prescription unless your symptoms persist or progress.   Some authorities believe that zinc sprays or the use of Echinacea may shorten the course of your symptoms.  Sinus infections are not as easily transmitted as other respiratory infection, however we still recommend that you avoid close contact with loved ones, especially the very young and elderly.  Remember to wash your hands thoroughly throughout the day as this is the number one way to prevent the spread of infection!  Home Care: Only take medications as instructed by your medical team. Do not take these medications with alcohol. A steam or ultrasonic humidifier can help congestion.  You can place a towel over your head and breathe in the steam from hot water coming from a faucet. Avoid close contacts especially the very young and  the elderly. Cover your mouth when you cough or sneeze. Always remember to wash your hands.  Get Help Right Away If: You develop worsening fever or sinus pain. You develop a severe head ache or visual changes. Your symptoms persist after you have completed your treatment plan.  Make sure you Understand these instructions. Will watch your condition. Will get help right away if you are not doing well or get worse.  Your e-visit answers were reviewed by a board certified advanced clinical practitioner to complete your personal care plan.  Depending on the condition, your plan could have included both over the counter or prescription medications.  If there is a problem please reply  once you have received a response from your provider.  Your safety is important to us.  If you have drug allergies check your prescription carefully.    You can use MyChart to ask questions about today's visit, request a non-urgent call back, or ask for a work or school excuse for 24 hours related to this e-Visit. If it has been greater than 24 hours you will need to follow up with your provider, or enter a new e-Visit to address those concerns.  You will get an e-mail in the next two days asking about your experience.  I hope that your e-visit has been valuable and will speed your recovery. Thank you for using e-visits.  Approximately 5 minutes was spent documenting and reviewing patient's chart.  

## 2020-06-23 NOTE — Addendum Note (Signed)
Addended by: Rodney Booze on: 06/23/2020 09:04 AM   Modules accepted: Orders

## 2020-06-28 DIAGNOSIS — J301 Allergic rhinitis due to pollen: Secondary | ICD-10-CM | POA: Diagnosis not present

## 2020-06-28 DIAGNOSIS — J3089 Other allergic rhinitis: Secondary | ICD-10-CM | POA: Diagnosis not present

## 2020-06-28 DIAGNOSIS — J3081 Allergic rhinitis due to animal (cat) (dog) hair and dander: Secondary | ICD-10-CM | POA: Diagnosis not present

## 2020-07-03 ENCOUNTER — Other Ambulatory Visit (HOSPITAL_BASED_OUTPATIENT_CLINIC_OR_DEPARTMENT_OTHER): Payer: Self-pay

## 2020-07-04 DIAGNOSIS — J3081 Allergic rhinitis due to animal (cat) (dog) hair and dander: Secondary | ICD-10-CM | POA: Diagnosis not present

## 2020-07-04 DIAGNOSIS — J301 Allergic rhinitis due to pollen: Secondary | ICD-10-CM | POA: Diagnosis not present

## 2020-07-04 DIAGNOSIS — J3089 Other allergic rhinitis: Secondary | ICD-10-CM | POA: Diagnosis not present

## 2020-07-13 ENCOUNTER — Other Ambulatory Visit (HOSPITAL_COMMUNITY): Payer: Self-pay | Admitting: Allergy

## 2020-07-13 DIAGNOSIS — J3089 Other allergic rhinitis: Secondary | ICD-10-CM | POA: Diagnosis not present

## 2020-07-13 DIAGNOSIS — J3081 Allergic rhinitis due to animal (cat) (dog) hair and dander: Secondary | ICD-10-CM | POA: Diagnosis not present

## 2020-07-13 MED FILL — PANTOPRAZOLE SOD DR 20 MG T: 20 | 90 days supply | Qty: 90 | Fill #1

## 2020-07-13 MED FILL — ALBUTEROL SULFATE HFA 108 (: 108 (90 BAS | 25 days supply | Qty: 18 | Fill #0

## 2020-07-20 ENCOUNTER — Other Ambulatory Visit (HOSPITAL_COMMUNITY): Payer: Self-pay

## 2020-07-20 DIAGNOSIS — J3081 Allergic rhinitis due to animal (cat) (dog) hair and dander: Secondary | ICD-10-CM | POA: Diagnosis not present

## 2020-07-20 DIAGNOSIS — J3089 Other allergic rhinitis: Secondary | ICD-10-CM | POA: Diagnosis not present

## 2020-08-09 DIAGNOSIS — K219 Gastro-esophageal reflux disease without esophagitis: Secondary | ICD-10-CM | POA: Diagnosis not present

## 2020-08-09 DIAGNOSIS — J4520 Mild intermittent asthma, uncomplicated: Secondary | ICD-10-CM | POA: Diagnosis not present

## 2020-08-09 DIAGNOSIS — J3089 Other allergic rhinitis: Secondary | ICD-10-CM | POA: Diagnosis not present

## 2020-08-09 DIAGNOSIS — J3081 Allergic rhinitis due to animal (cat) (dog) hair and dander: Secondary | ICD-10-CM | POA: Diagnosis not present

## 2020-08-16 DIAGNOSIS — J301 Allergic rhinitis due to pollen: Secondary | ICD-10-CM | POA: Diagnosis not present

## 2020-08-16 DIAGNOSIS — J3081 Allergic rhinitis due to animal (cat) (dog) hair and dander: Secondary | ICD-10-CM | POA: Diagnosis not present

## 2020-08-16 DIAGNOSIS — J3089 Other allergic rhinitis: Secondary | ICD-10-CM | POA: Diagnosis not present

## 2020-08-24 DIAGNOSIS — J3089 Other allergic rhinitis: Secondary | ICD-10-CM | POA: Diagnosis not present

## 2020-08-24 DIAGNOSIS — J301 Allergic rhinitis due to pollen: Secondary | ICD-10-CM | POA: Diagnosis not present

## 2020-08-24 DIAGNOSIS — J3081 Allergic rhinitis due to animal (cat) (dog) hair and dander: Secondary | ICD-10-CM | POA: Diagnosis not present

## 2020-08-28 DIAGNOSIS — G4733 Obstructive sleep apnea (adult) (pediatric): Secondary | ICD-10-CM | POA: Diagnosis not present

## 2020-08-29 DIAGNOSIS — J3089 Other allergic rhinitis: Secondary | ICD-10-CM | POA: Diagnosis not present

## 2020-08-29 DIAGNOSIS — J3081 Allergic rhinitis due to animal (cat) (dog) hair and dander: Secondary | ICD-10-CM | POA: Diagnosis not present

## 2020-09-07 DIAGNOSIS — J301 Allergic rhinitis due to pollen: Secondary | ICD-10-CM | POA: Diagnosis not present

## 2020-09-07 DIAGNOSIS — J3081 Allergic rhinitis due to animal (cat) (dog) hair and dander: Secondary | ICD-10-CM | POA: Diagnosis not present

## 2020-09-07 DIAGNOSIS — J3089 Other allergic rhinitis: Secondary | ICD-10-CM | POA: Diagnosis not present

## 2020-09-12 DIAGNOSIS — J3089 Other allergic rhinitis: Secondary | ICD-10-CM | POA: Diagnosis not present

## 2020-09-12 DIAGNOSIS — J301 Allergic rhinitis due to pollen: Secondary | ICD-10-CM | POA: Diagnosis not present

## 2020-09-19 DIAGNOSIS — J3081 Allergic rhinitis due to animal (cat) (dog) hair and dander: Secondary | ICD-10-CM | POA: Diagnosis not present

## 2020-09-19 DIAGNOSIS — J3089 Other allergic rhinitis: Secondary | ICD-10-CM | POA: Diagnosis not present

## 2020-09-23 DIAGNOSIS — J3089 Other allergic rhinitis: Secondary | ICD-10-CM | POA: Diagnosis not present

## 2020-09-23 DIAGNOSIS — J3081 Allergic rhinitis due to animal (cat) (dog) hair and dander: Secondary | ICD-10-CM | POA: Diagnosis not present

## 2020-09-27 DIAGNOSIS — J3089 Other allergic rhinitis: Secondary | ICD-10-CM | POA: Diagnosis not present

## 2020-09-27 DIAGNOSIS — J3081 Allergic rhinitis due to animal (cat) (dog) hair and dander: Secondary | ICD-10-CM | POA: Diagnosis not present

## 2020-10-10 ENCOUNTER — Ambulatory Visit: Payer: 59 | Admitting: Pulmonary Disease

## 2020-10-10 DIAGNOSIS — J3089 Other allergic rhinitis: Secondary | ICD-10-CM | POA: Diagnosis not present

## 2020-10-10 DIAGNOSIS — J3081 Allergic rhinitis due to animal (cat) (dog) hair and dander: Secondary | ICD-10-CM | POA: Diagnosis not present

## 2020-10-17 ENCOUNTER — Other Ambulatory Visit: Payer: Self-pay | Admitting: Family Medicine

## 2020-10-17 ENCOUNTER — Other Ambulatory Visit (HOSPITAL_COMMUNITY): Payer: Self-pay

## 2020-10-17 DIAGNOSIS — K219 Gastro-esophageal reflux disease without esophagitis: Secondary | ICD-10-CM

## 2020-10-17 MED ORDER — PANTOPRAZOLE SODIUM 20 MG PO TBEC
DELAYED_RELEASE_TABLET | Freq: Every day | ORAL | 1 refills | Status: DC | PRN
  Filled 2020-10-17: qty 90, 90d supply, fill #0
  Filled 2021-01-15: qty 90, 90d supply, fill #1

## 2020-10-18 DIAGNOSIS — J3089 Other allergic rhinitis: Secondary | ICD-10-CM | POA: Diagnosis not present

## 2020-10-18 DIAGNOSIS — J3081 Allergic rhinitis due to animal (cat) (dog) hair and dander: Secondary | ICD-10-CM | POA: Diagnosis not present

## 2020-10-23 DIAGNOSIS — J3089 Other allergic rhinitis: Secondary | ICD-10-CM | POA: Diagnosis not present

## 2020-10-23 DIAGNOSIS — J3081 Allergic rhinitis due to animal (cat) (dog) hair and dander: Secondary | ICD-10-CM | POA: Diagnosis not present

## 2020-10-24 ENCOUNTER — Ambulatory Visit: Payer: 59 | Admitting: Pulmonary Disease

## 2020-10-24 ENCOUNTER — Encounter: Payer: Self-pay | Admitting: Pulmonary Disease

## 2020-10-24 ENCOUNTER — Other Ambulatory Visit: Payer: Self-pay

## 2020-10-24 VITALS — BP 130/88 | HR 64 | Temp 97.9°F | Ht 75.0 in | Wt 290.4 lb

## 2020-10-24 DIAGNOSIS — E669 Obesity, unspecified: Secondary | ICD-10-CM | POA: Diagnosis not present

## 2020-10-24 DIAGNOSIS — G4733 Obstructive sleep apnea (adult) (pediatric): Secondary | ICD-10-CM | POA: Diagnosis not present

## 2020-10-24 DIAGNOSIS — G4730 Sleep apnea, unspecified: Secondary | ICD-10-CM

## 2020-10-24 NOTE — Progress Notes (Signed)
[facility]  Chief Complaint  Patient presents with   Follow-up    Patient states he is doing good overall, CPAP is not transmitting anymore but still works fine.     Constitutional:  BP 130/88 (BP Location: Left Arm, Patient Position: Sitting, Cuff Size: Large)   Pulse 64   Temp 97.9 F (36.6 C) (Oral)   Ht 6\' 3"  (1.905 m)   Wt 290 lb 6.4 oz (131.7 kg)   SpO2 98%   BMI 36.30 kg/m   Past Medical History:  Varicocele, Retinal detachment, GERD, Asthma, Allergic rhinitis  Past Surgical History:  He  has a past surgical history that includes Tonsillectomy and adenoidectomy; Eye surgery; and Testicle surgery (Right).  Brief Summary:  Sean Allen is a 35 y.o. male with obstructive sleep apnea.      Subjective:   Last saw him in 2020.  His CPAP is no longer sending signal electronically.  Uses full face mask.  Mild mouth dryness.  Sleeps well.  He would only want to get Resmed device.  Working with PCP on diet.  He exercises on regular basis.  Physical Exam:   Appearance - well kempt   ENMT - no sinus tenderness, no oral exudate, no LAN, Mallampati 3 airway, no stridor  Respiratory - equal breath sounds bilaterally, no wheezing or rales  CV - s1s2 regular rate and rhythm, no murmurs  Ext - no clubbing, no edema  Skin - no rashes  Psych - normal mood and affect   Sleep Tests:  HST 02/10/15 >> AHI 44.4, SaO2 low 76%. Auto CPAP 07/26/20 to 10/23/20 >> used on 90 of 90 nights with average 7 hrs 2 min.  Average AHI 1.7 with median CPAP 9 and 95 th percentile CPAP 13 cm H2O  Social History:  He  reports that he has never smoked. He has never used smokeless tobacco. He reports current alcohol use. He reports that he does not use drugs.  Family History:  His family history includes Arthritis in his mother; Bradycardia in his mother; Diabetes in his father, maternal grandfather, and paternal aunt; Healthy in his sister;  Heart attack in his paternal grandfather and paternal grandmother; Heart disease in his maternal grandfather, maternal grandmother, paternal grandfather, and paternal grandmother; High Cholesterol in his paternal grandmother; Hyperlipidemia in his father; Hypertension in his paternal grandmother; Kidney failure in his maternal grandfather and maternal grandmother; Lung disease in his father; Lymphoma in his maternal grandmother; Other in his father; Prostate cancer in his paternal great-grandfather; Prostate cancer (age of onset: 1) in his paternal grandfather; Sleep apnea in his father.     Assessment/Plan:   Obstructive sleep apnea.   - he is compliant with CPAP and reports benefit from therapy - he uses [employer] for his DME - his machine is more than 33 yrs old and not functioning properly - will arrange for new Resmed auto CPAP device at 5 to 15 cm H2O  Obesity. - he exercises on regular basis - he is working with PCP about calorie restriction  Time Spent Involved in Patient Care on Day of Examination:  24 minutes  Follow up:   Patient Instructions  Will arrange for new Resmed CPAP machine  Follow up in 4 to 5 months   Medication List:   Allergies as of 10/24/2020       Reactions   Ciprofloxacin    GI upset   Dust Mite Extract    Grass  Extracts [gramineae Pollens]    Mold Extract [trichophyton]    Other    Cats         Medication List        Accurate as of October 24, 2020 12:45 PM. If you have any questions, ask your nurse or doctor.          STOP taking these medications    fluticasone 50 MCG/ACT nasal spray Commonly known as: FLONASE Stopped by: Chesley Mires, MD       TAKE these medications    albuterol 108 (90 Base) MCG/ACT inhaler Commonly known as: VENTOLIN HFA INHALE 1 TO 2 PUFFS BY MOUTH EVERY 6 HOURS AS NEEDED FOR COUGH OR WHEEZE What changed: Another medication with the same name was removed. Continue taking this medication, and follow the  directions you see here. Changed by: Chesley Mires, MD   MULTIVITAMIN ADULT PO Take by mouth.   pantoprazole 20 MG tablet Commonly known as: PROTONIX TAKE 1 TABLET BY MOUTH ONCE A DAY AS NEEDED   vitamin C 1000 MG tablet Take 1,000 mg by mouth daily.        Signature:  Chesley Mires, MD [place] Pager - [phone] 10/24/2020, 12:45 PM

## 2020-10-24 NOTE — Patient Instructions (Signed)
Will arrange for new Resmed CPAP machine  Follow up in 4 to 5 months

## 2020-11-06 DIAGNOSIS — J3081 Allergic rhinitis due to animal (cat) (dog) hair and dander: Secondary | ICD-10-CM | POA: Diagnosis not present

## 2020-11-06 DIAGNOSIS — J3089 Other allergic rhinitis: Secondary | ICD-10-CM | POA: Diagnosis not present

## 2020-11-14 DIAGNOSIS — J3089 Other allergic rhinitis: Secondary | ICD-10-CM | POA: Diagnosis not present

## 2020-11-14 DIAGNOSIS — J3081 Allergic rhinitis due to animal (cat) (dog) hair and dander: Secondary | ICD-10-CM | POA: Diagnosis not present

## 2020-11-20 DIAGNOSIS — G4733 Obstructive sleep apnea (adult) (pediatric): Secondary | ICD-10-CM | POA: Diagnosis not present

## 2020-11-20 DIAGNOSIS — J3089 Other allergic rhinitis: Secondary | ICD-10-CM | POA: Diagnosis not present

## 2020-11-20 DIAGNOSIS — J3081 Allergic rhinitis due to animal (cat) (dog) hair and dander: Secondary | ICD-10-CM | POA: Diagnosis not present

## 2020-11-22 ENCOUNTER — Telehealth: Payer: 59 | Admitting: Nurse Practitioner

## 2020-11-22 DIAGNOSIS — W57XXXA Bitten or stung by nonvenomous insect and other nonvenomous arthropods, initial encounter: Secondary | ICD-10-CM

## 2020-11-22 DIAGNOSIS — S80869A Insect bite (nonvenomous), unspecified lower leg, initial encounter: Secondary | ICD-10-CM | POA: Diagnosis not present

## 2020-11-22 DIAGNOSIS — J4520 Mild intermittent asthma, uncomplicated: Secondary | ICD-10-CM | POA: Insufficient documentation

## 2020-11-22 DIAGNOSIS — J3081 Allergic rhinitis due to animal (cat) (dog) hair and dander: Secondary | ICD-10-CM | POA: Insufficient documentation

## 2020-11-22 MED ORDER — DOXYCYCLINE HYCLATE 100 MG PO TABS: 100.0000 mg | ORAL_TABLET | Freq: Two times a day (BID) | ORAL | 0 refills | Status: AC

## 2020-11-22 NOTE — Progress Notes (Signed)
E-Visit for Tick Bite  Thank you for describing your tick bite, Here is how we plan to help! Based on the information that you shared with me it looks like you have An infected or complicated tick bite that requires a longer course of antibiotics and will need for you to schedule a follow-up visit with a provider.  In most cases a tick bite is painless and does not itch.  Most tick bites in which the tick is quickly removed do not require prescriptions. Ticks can transmit several diseases if they are infected and remain attacked to your skin. Therefore the length that the tick was attached and any symptoms you have experienced after the bite are import to accurately develop your custom treatment plan. In most cases a single dose of doxycycline may prevent the development of a more serious condition.  Based on your information I have Provided a home care guide for tick bites and  instructions on when to call for help. and Your symptoms indicate that you need a longer course of antibiotics and a follow up visit with a provider. I have sent doxycycline 100 mg twice a day for 21 days to the pharmacy that you selected. You will need to schedule a follow up visit with your provider. If you do not have a primary care provider you may use our telehealth physicians on the web at [facility]  are associated with illness?  The Wood Tick (dog tick) is the size of a watermelon seed and can sometimes transmit [facility] spotted fever and [place] tick fever.   The Deer Tick (black-legged tick) is between the size of a poppy seed (pin head) and an apple seed, and can sometimes transmit Lyme disease.  A brown to black tick with a white splotch on its back is likely a male Amblyomma americanum ([place] tick). This tick has been associated with Southern Tick Associated illness ( STARI)  Lyme disease has become the most common tick-borne illness in the [place]. The risk of Lyme  disease following a recognized deer tick bite is estimated to be 1%.  The majority of cases of Lyme disease start with a bull's eye rash at the site of the tick bite. The rash can occur days to weeks (typically 7-10 days) after a tick bite. Treatment with antibiotics is indicated if this rash appears. Flu-like symptoms may accompany the rash, including: fever, chills, headaches, muscle aches, and fatigue. Removing ticks promptly may prevent tick borne disease.  What can be used to prevent Tick Bites?  Insect repellant with at leas 20% DEET. Wearing long pants with sock and shoes. Avoiding tall grass and heavily wooded areas. Checking your skin after being outdoors. Shower with a washcloth after outdoor exposures.  HOME CARE ADVICE FOR TICK BITE  Wood Tick Removal:  Use a pair of tweezers and grasp the wood tick close to the skin (on its head). Pull the wood tick straight upward without twisting or crushing it. Maintain a steady pressure until it releases its grip.   If tweezers aren't available, use fingers, a loop of thread around the jaws, or a needle between the jaws for traction.  Note: covering the tick with petroleum jelly, nail polish or rubbing alcohol doesn't work. Neither does touching the tick with a hot or cold object. Tiny Deer Tick Removal:   Needs to be scraped off with a knife blade or credit card edge. Place tick in a sealed container (e.g. glass jar,  zip lock plastic bag), in case your doctor wants to see it. Tick's Head Removal:  If the wood tick's head breaks off in the skin, it must be removed. Clean the skin. Then use a sterile needle to uncover the head and lift it out or scrape it off.  If a very small piece of the head remains, the skin will eventually slough it off. Antibiotic Ointment:  Wash the wound and your hands with soap and water after removal to prevent catching any tick disease.  Apply an over the counter antibiotic ointment (e.g. bacitracin) to the bite  once. Expected Course: Tick bites normally don't itch or hurt. That's why they often go unnoticed. Call Your Doctor If:  You can't remove the tick or the tick's head Fever, a severe head ache, or rash occur in the next 2 weeks Bite begins to look infected Lyme's disease is common in your area You have not had a tetanus in the last 10 years Your current symptoms become worse    MAKE SURE YOU  Understand these instructions. Will watch your condition. Will get help right away if you are not doing well or get worse.    Thank you for choosing an e-visit.  Your e-visit answers were reviewed by a board certified advanced clinical practitioner to complete your personal care plan. Depending upon the condition, your plan could have included both over the counter or prescription medications.  Please review your pharmacy choice. Make sure the pharmacy is open so you can pick up prescription now. If there is a problem, you may contact your provider through [employer] and have the prescription routed to another pharmacy.  Your safety is important to [place]. If you have drug allergies check your prescription carefully.   For the next 24 hours you can use MyChart to ask questions about today's visit, request a non-urgent call back, or ask for a work or school excuse. You will get an email in the next two days asking about your experience. I hope that your e-visit has been valuable and will speed your recovery.   I spent approximately 7 minutes reviewing the patient's history, current symptoms and coordinating their plan of care today.    Meds ordered this encounter  Medications   doxycycline (VIBRA-TABS) 100 MG tablet    Sig: Take 1 tablet (100 mg total) by mouth 2 (two) times daily for 21 days.    Dispense:  42 tablet    Refill:  0

## 2020-11-30 DIAGNOSIS — J3081 Allergic rhinitis due to animal (cat) (dog) hair and dander: Secondary | ICD-10-CM | POA: Diagnosis not present

## 2020-11-30 DIAGNOSIS — J301 Allergic rhinitis due to pollen: Secondary | ICD-10-CM | POA: Diagnosis not present

## 2020-11-30 DIAGNOSIS — J3089 Other allergic rhinitis: Secondary | ICD-10-CM | POA: Diagnosis not present

## 2020-12-08 DIAGNOSIS — J3089 Other allergic rhinitis: Secondary | ICD-10-CM | POA: Diagnosis not present

## 2020-12-08 DIAGNOSIS — J3081 Allergic rhinitis due to animal (cat) (dog) hair and dander: Secondary | ICD-10-CM | POA: Diagnosis not present

## 2020-12-12 DIAGNOSIS — J3089 Other allergic rhinitis: Secondary | ICD-10-CM | POA: Diagnosis not present

## 2020-12-12 DIAGNOSIS — J3081 Allergic rhinitis due to animal (cat) (dog) hair and dander: Secondary | ICD-10-CM | POA: Diagnosis not present

## 2020-12-21 DIAGNOSIS — J3081 Allergic rhinitis due to animal (cat) (dog) hair and dander: Secondary | ICD-10-CM | POA: Diagnosis not present

## 2020-12-21 DIAGNOSIS — J3089 Other allergic rhinitis: Secondary | ICD-10-CM | POA: Diagnosis not present

## 2020-12-28 DIAGNOSIS — J3089 Other allergic rhinitis: Secondary | ICD-10-CM | POA: Diagnosis not present

## 2020-12-28 DIAGNOSIS — J3081 Allergic rhinitis due to animal (cat) (dog) hair and dander: Secondary | ICD-10-CM | POA: Diagnosis not present

## 2021-01-01 DIAGNOSIS — J3081 Allergic rhinitis due to animal (cat) (dog) hair and dander: Secondary | ICD-10-CM | POA: Diagnosis not present

## 2021-01-01 DIAGNOSIS — J3089 Other allergic rhinitis: Secondary | ICD-10-CM | POA: Diagnosis not present

## 2021-01-04 DIAGNOSIS — J3081 Allergic rhinitis due to animal (cat) (dog) hair and dander: Secondary | ICD-10-CM | POA: Diagnosis not present

## 2021-01-08 DIAGNOSIS — J3081 Allergic rhinitis due to animal (cat) (dog) hair and dander: Secondary | ICD-10-CM | POA: Diagnosis not present

## 2021-01-08 DIAGNOSIS — J3089 Other allergic rhinitis: Secondary | ICD-10-CM | POA: Diagnosis not present

## 2021-01-15 ENCOUNTER — Other Ambulatory Visit (HOSPITAL_COMMUNITY): Payer: Self-pay

## 2021-01-17 DIAGNOSIS — J3089 Other allergic rhinitis: Secondary | ICD-10-CM | POA: Diagnosis not present

## 2021-01-17 DIAGNOSIS — J3081 Allergic rhinitis due to animal (cat) (dog) hair and dander: Secondary | ICD-10-CM | POA: Diagnosis not present

## 2021-01-22 DIAGNOSIS — J3089 Other allergic rhinitis: Secondary | ICD-10-CM | POA: Diagnosis not present

## 2021-01-22 DIAGNOSIS — J3081 Allergic rhinitis due to animal (cat) (dog) hair and dander: Secondary | ICD-10-CM | POA: Diagnosis not present

## 2021-01-24 DIAGNOSIS — J3081 Allergic rhinitis due to animal (cat) (dog) hair and dander: Secondary | ICD-10-CM | POA: Diagnosis not present

## 2021-01-24 DIAGNOSIS — J3089 Other allergic rhinitis: Secondary | ICD-10-CM | POA: Diagnosis not present

## 2021-02-05 DIAGNOSIS — J301 Allergic rhinitis due to pollen: Secondary | ICD-10-CM | POA: Diagnosis not present

## 2021-02-05 DIAGNOSIS — J3089 Other allergic rhinitis: Secondary | ICD-10-CM | POA: Diagnosis not present

## 2021-02-12 DIAGNOSIS — J3081 Allergic rhinitis due to animal (cat) (dog) hair and dander: Secondary | ICD-10-CM | POA: Diagnosis not present

## 2021-02-12 DIAGNOSIS — J3089 Other allergic rhinitis: Secondary | ICD-10-CM | POA: Diagnosis not present

## 2021-02-12 DIAGNOSIS — J301 Allergic rhinitis due to pollen: Secondary | ICD-10-CM | POA: Diagnosis not present

## 2021-02-21 DIAGNOSIS — J301 Allergic rhinitis due to pollen: Secondary | ICD-10-CM | POA: Diagnosis not present

## 2021-02-21 DIAGNOSIS — J3081 Allergic rhinitis due to animal (cat) (dog) hair and dander: Secondary | ICD-10-CM | POA: Diagnosis not present

## 2021-02-21 DIAGNOSIS — J3089 Other allergic rhinitis: Secondary | ICD-10-CM | POA: Diagnosis not present

## 2021-02-26 ENCOUNTER — Other Ambulatory Visit: Payer: Self-pay

## 2021-02-26 ENCOUNTER — Ambulatory Visit (INDEPENDENT_AMBULATORY_CARE_PROVIDER_SITE_OTHER): Payer: 59 | Admitting: Family Medicine

## 2021-02-26 ENCOUNTER — Encounter: Payer: Self-pay | Admitting: Family Medicine

## 2021-02-26 VITALS — BP 140/90 | HR 69 | Temp 98.0°F | Ht 74.25 in | Wt 298.9 lb

## 2021-02-26 DIAGNOSIS — J4520 Mild intermittent asthma, uncomplicated: Secondary | ICD-10-CM | POA: Diagnosis not present

## 2021-02-26 DIAGNOSIS — J3081 Allergic rhinitis due to animal (cat) (dog) hair and dander: Secondary | ICD-10-CM | POA: Diagnosis not present

## 2021-02-26 DIAGNOSIS — H9193 Unspecified hearing loss, bilateral: Secondary | ICD-10-CM

## 2021-02-26 DIAGNOSIS — R739 Hyperglycemia, unspecified: Secondary | ICD-10-CM | POA: Diagnosis not present

## 2021-02-26 DIAGNOSIS — R8290 Unspecified abnormal findings in urine: Secondary | ICD-10-CM

## 2021-02-26 DIAGNOSIS — G4733 Obstructive sleep apnea (adult) (pediatric): Secondary | ICD-10-CM | POA: Diagnosis not present

## 2021-02-26 DIAGNOSIS — Z13220 Encounter for screening for lipoid disorders: Secondary | ICD-10-CM | POA: Diagnosis not present

## 2021-02-26 DIAGNOSIS — E538 Deficiency of other specified B group vitamins: Secondary | ICD-10-CM | POA: Diagnosis not present

## 2021-02-26 DIAGNOSIS — R635 Abnormal weight gain: Secondary | ICD-10-CM | POA: Diagnosis not present

## 2021-02-26 DIAGNOSIS — Z23 Encounter for immunization: Secondary | ICD-10-CM

## 2021-02-26 DIAGNOSIS — M722 Plantar fascial fibromatosis: Secondary | ICD-10-CM

## 2021-02-26 DIAGNOSIS — K219 Gastro-esophageal reflux disease without esophagitis: Secondary | ICD-10-CM

## 2021-02-26 DIAGNOSIS — Z0000 Encounter for general adult medical examination without abnormal findings: Secondary | ICD-10-CM | POA: Diagnosis not present

## 2021-02-26 DIAGNOSIS — J3089 Other allergic rhinitis: Secondary | ICD-10-CM | POA: Diagnosis not present

## 2021-02-26 DIAGNOSIS — J301 Allergic rhinitis due to pollen: Secondary | ICD-10-CM | POA: Diagnosis not present

## 2021-02-26 LAB — COMPREHENSIVE METABOLIC PANEL
ALT: 47 U/L (ref 0–53)
AST: 43 U/L — ABNORMAL HIGH (ref 0–37)
Albumin: 4.6 g/dL (ref 3.5–5.2)
Alkaline Phosphatase: 61 U/L (ref 39–117)
BUN: 15 mg/dL (ref 6–23)
CO2: 25 mEq/L (ref 19–32)
Calcium: 9.6 mg/dL (ref 8.4–10.5)
Chloride: 102 mEq/L (ref 96–112)
Creatinine, Ser: 1.09 mg/dL (ref 0.40–1.50)
GFR: 89.04 mL/min (ref >60.00)
Glucose, Bld: 97 mg/dL (ref 70–99)
Potassium: 4.1 mEq/L (ref 3.5–5.1)
Sodium: 138 mEq/L (ref 135–145)
Total Bilirubin: 0.7 mg/dL (ref 0.2–1.2)
Total Protein: 7.3 g/dL (ref 6.0–8.3)

## 2021-02-26 LAB — LIPID PANEL
Cholesterol: 181 mg/dL (ref 0–200)
HDL: 45.3 mg/dL (ref >39.00)
LDL Cholesterol: 98 mg/dL (ref 0–99)
NonHDL: 136.16
Total CHOL/HDL Ratio: 4
Triglycerides: 190 mg/dL — ABNORMAL HIGH (ref 0.0–149.0)
VLDL: 38 mg/dL (ref 0.0–40.0)

## 2021-02-26 LAB — URINALYSIS
Bilirubin Urine: NEGATIVE
Hgb urine dipstick: NEGATIVE
Ketones, ur: NEGATIVE
Leukocytes,Ua: NEGATIVE
Nitrite: NEGATIVE
Specific Gravity, Urine: <=1.005 — AB (ref 1.000–1.030)
Total Protein, Urine: NEGATIVE
Urine Glucose: NEGATIVE
Urobilinogen, UA: 0.2 (ref 0.0–1.0)
pH: 6.5 (ref 5.0–8.0)

## 2021-02-26 LAB — CBC WITH DIFFERENTIAL/PLATELET
Basophils Absolute: 0 10*3/uL (ref 0.0–0.1)
Basophils Relative: 0.7 % (ref 0.0–3.0)
Eosinophils Absolute: 0.1 10*3/uL (ref 0.0–0.7)
Eosinophils Relative: 1.7 % (ref 0.0–5.0)
HCT: 40.8 % (ref 39.0–52.0)
Hemoglobin: 14 g/dL (ref 13.0–17.0)
Lymphocytes Relative: 21.6 % (ref 12.0–46.0)
Lymphs Abs: 1 10*3/uL (ref 0.7–4.0)
MCHC: 34.4 g/dL (ref 30.0–36.0)
MCV: 91.3 fl (ref 78.0–100.0)
Monocytes Absolute: 0.4 10*3/uL (ref 0.1–1.0)
Monocytes Relative: 7.3 % (ref 3.0–12.0)
Neutro Abs: 3.3 10*3/uL (ref 1.4–7.7)
Neutrophils Relative %: 68.7 % (ref 43.0–77.0)
Platelets: 224 10*3/uL (ref 150.0–400.0)
RBC: 4.47 Mil/uL (ref 4.22–5.81)
RDW: 12.6 % (ref 11.5–15.5)
WBC: 4.9 10*3/uL (ref 4.0–10.5)

## 2021-02-26 LAB — TSH: TSH: 2.41 u[IU]/mL (ref 0.35–5.50)

## 2021-02-26 LAB — HEMOGLOBIN A1C: Hgb A1c MFr Bld: 5.5 % (ref 4.6–6.5)

## 2021-02-26 LAB — VITAMIN B12: Vitamin B-12: 354 pg/mL (ref 211–911)

## 2021-02-26 LAB — FOLATE: Folate: 22.7 ng/mL (ref >5.9)

## 2021-02-26 NOTE — Progress Notes (Signed)
Sean Allen DOB: 1985-09-08 Encounter date: 02/26/2021  This is a 35 y.o. male who presents for complete physical   History of present illness/Additional concerns: Lowest he sees is 120/80's, never sees something over 20. Did have salty meal yesterday, and does over-salt. Never sees 557'D systolic. Does check regularly at home. Wife using same cuff so does feel that cuff is accurate. Does have bp issues on both sides of family.   Working out 3 days/week. Has dropped a pant and a half weight size. Surprised that scale isn't reflecting this. Sizes overall has decreased. When he was still seeing Dr. Maudie Mercury - lost about 40 lbs; then got stuck in the 288 range. Was doing cross fit at that time. Then covid hit - started at [employer]. Definitely smaller waist. Always does at least 30 min cardio and then finishes with muscle work.   Feels great overall. Only time he doesn't feel good is if he sleeps without cpap machine. Doesn't feel tired. wakes before alarm.   Diet pretty good - trying to stay around 2000 cal. Nothing fried. 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 Breakfast - oatmeal, lunch - eats at home:salad with rotisserie chicken or baked chicken/salad, yogurt, dinner: usually grilled meat or at least low carb. Stays away from processed foods. Does drink milk - has cut down to 1 gallon every 2 weeks rather than 1 gallon a week. Whole milk.   Does report if not eating and if working outside will get some hypoglycemia. Feels better with eating/drinking.   Mild intermittent asthma: breathing has been stable. Had repeat allergy testing recently. Not really using albuterol often. Does take albuterol before going to gym and not waiting. Humidity affects breathing.   Reflux does fine if eating well. If not, then takes medication.   Had episode in the last 2 months where he got very constipated.  Then had a very hard and painful stool and noticed some pink  tinge in the toilet.  No blood with wiping, no further blood in stool.  Had some rectal pain for a couple of days which resolved on its own.  Since increasing fiber in his diet, has not had any more issues with constipation or discomfort.   Left ankle still swells after injury. Doesn't hurt.  Sleeping with carpal tunnel splints help, but they still hurt. Not ready to see specialist yet. Sometimes gets a little knot in thenar area, but goes away by next day.   Past Medical History:  Diagnosis Date   Allergic rhinitis 08/30/2013   Asthma    GERD (gastroesophageal reflux disease)    Hx of retinal detachment - [facility] 08/30/2013   bilat   OSA (obstructive sleep apnea) 02/14/2015   Varicocele, evaluated by urologist in the past 08/30/2013   Past Surgical History:  Procedure Laterality Date   EYE SURGERY     bilateral retinal detachment   TESTICLE SURGERY Right    TONSILLECTOMY AND ADENOIDECTOMY     Allergies  Allergen Reactions   Ciprofloxacin     GI upset   Dust Mite Extract    Grass Extracts [Gramineae Pollens]    Mold Extract [Trichophyton]    Other     Cats    Current Meds  Medication Sig   albuterol (VENTOLIN HFA) 108 (90 Base) MCG/ACT inhaler INHALE 1 TO 2 PUFFS BY MOUTH EVERY 6 HOURS AS NEEDED FOR COUGH OR WHEEZE   Ascorbic Acid (VITAMIN C) 1000 MG tablet  Take 2,000 mg by mouth daily.   Multiple Vitamins-Minerals (MULTIVITAMIN ADULT PO) Take by mouth.   Omega-3 Fatty Acids (FISH OIL PO) Take by mouth daily.   pantoprazole (PROTONIX) 20 MG tablet TAKE 1 TABLET BY MOUTH ONCE A DAY AS NEEDED   PRESCRIPTION MEDICATION Allergy injections by Dr Donneta Romberg   Social History   Tobacco Use   Smoking status: Never   Smokeless tobacco: Never  Substance Use Topics   Alcohol use: Yes    Alcohol/week: 0.0 standard drinks    Comment: occ 1-2 drinks   Family History  Problem Relation Age of Onset   Hyperlipidemia Father    Other Father        prostate enlargement    Sleep apnea Father    Lung disease Father        asbestosis   Diabetes Father        prediabetes   Hypertension Paternal Grandmother    Heart disease Paternal Grandmother    Heart attack Paternal Grandmother    High Cholesterol Paternal Grandmother    Arthritis Mother        neck, knees   Bradycardia Mother    Heart disease Maternal Grandmother    Lymphoma Maternal Grandmother    Kidney failure Maternal Grandmother    Heart disease Maternal Grandfather    Kidney failure Maternal Grandfather    Diabetes Maternal Grandfather    Heart disease Paternal Grandfather    Heart attack Paternal Grandfather    Prostate cancer Paternal Grandfather 96   Healthy Sister        fatty liver   Prostate cancer Paternal Great-grandfather    Diabetes Paternal Aunt      Review of Systems  Constitutional:  Negative for activity change, appetite change, chills, fatigue, fever and unexpected weight change.  HENT:  Negative for congestion, ear pain, hearing loss, sinus pressure, sinus pain, sore throat and trouble swallowing.   Eyes:  Negative for pain and visual disturbance.  Respiratory:  Negative for cough, chest tightness, shortness of breath and wheezing.   Cardiovascular:  Negative for chest pain, palpitations and leg swelling.  Gastrointestinal:  Negative for abdominal distention, abdominal pain, blood in stool, constipation, diarrhea, nausea and vomiting.  Genitourinary:  Negative for decreased urine volume, difficulty urinating, dysuria, penile pain and testicular pain.  Musculoskeletal:  Negative for arthralgias, back pain and joint swelling.       Right heel pain, notes in morning; feels better after activity  Skin:  Negative for rash.  Neurological:  Negative for dizziness, weakness, numbness and headaches.  Hematological:  Negative for adenopathy. Does not bruise/bleed easily.  Psychiatric/Behavioral:  Negative for agitation, sleep disturbance and suicidal ideas. The patient is not  nervous/anxious.    CBC:  Lab Results  Component Value Date   WBC 4.9 02/26/2021   HGB 14.0 02/26/2021   HCT 40.8 02/26/2021   MCHC 34.4 02/26/2021   RDW 12.6 02/26/2021   PLT 224.0 02/26/2021   CMP: Lab Results  Component Value Date   NA 138 02/26/2021   K 4.1 02/26/2021   CL 102 02/26/2021   CO2 25 02/26/2021   GLUCOSE 97 02/26/2021   BUN 15 02/26/2021   CREATININE 1.09 02/26/2021   CALCIUM 9.6 02/26/2021   PROT 7.3 02/26/2021   BILITOT 0.7 02/26/2021   ALKPHOS 61 02/26/2021   ALT 47 02/26/2021   AST 43 (H) 02/26/2021   LIPID: Lab Results  Component Value Date   CHOL 181 02/26/2021   TRIG  190.0 (H) 02/26/2021   HDL 45.30 02/26/2021   LDLCALC 98 02/26/2021    Objective:  BP 140/90 (BP Location: Left Arm, Patient Position: Sitting, Cuff Size: Large)   Pulse 69   Temp 98 F (36.7 C) (Oral)   Ht 6' 2.25" (1.886 m)   Wt 298 lb 14.4 oz (135.6 kg)   SpO2 99%   BMI 38.12 kg/m   Weight: 298 lb 14.4 oz (135.6 kg)   BP Readings from Last 3 Encounters:  02/26/21 140/90  10/24/20 130/88  02/23/20 118/90   Wt Readings from Last 3 Encounters:  02/26/21 298 lb 14.4 oz (135.6 kg)  10/24/20 290 lb 6.4 oz (131.7 kg)  02/23/20 289 lb 8 oz (131.3 kg)    Physical Exam Constitutional:      General: He is not in acute distress.    Appearance: He is well-developed. He is obese.  HENT:     Head: Normocephalic and atraumatic.     Right Ear: External ear normal.     Left Ear: External ear normal.     Nose: Nose normal.     Mouth/Throat:     Pharynx: No oropharyngeal exudate.  Eyes:     Conjunctiva/sclera: Conjunctivae normal.     Pupils: Pupils are equal, round, and reactive to light.  Neck:     Thyroid: No thyromegaly.  Cardiovascular:     Rate and Rhythm: Normal rate and regular rhythm.     Heart sounds: Normal heart sounds. No murmur heard.   No friction rub. No gallop.  Pulmonary:     Effort: Pulmonary effort is normal. No respiratory distress.     Breath  sounds: Normal breath sounds. No stridor. No wheezing or rales.  Abdominal:     General: Bowel sounds are normal.     Palpations: Abdomen is soft.  Genitourinary:    Penis: Normal and circumcised.      Testes: Normal.     Epididymis:     Right: Normal.     Left: Normal.     Prostate: Normal.     Rectum: Normal. No tenderness, external hemorrhoid or internal hemorrhoid.  Musculoskeletal:        General: Normal range of motion.     Cervical back: Neck supple.  Lymphadenopathy:     Lower Body: No right inguinal adenopathy. No left inguinal adenopathy.  Skin:    General: Skin is warm and dry.  Neurological:     Mental Status: He is alert and oriented to person, place, and time.  Psychiatric:        Behavior: Behavior normal.        Thought Content: Thought content normal.        Judgment: Judgment normal.    Assessment/Plan: Health Maintenance Due  Topic Date Due   Hepatitis C Screening  Never done   COVID-19 Vaccine (5 - Booster for Pfizer series) 03/24/2020   Health Maintenance reviewed - will get prevnar today; suggest covid booster.  1. Preventative health care Keep up with exercise; keep working on cutting whole milk down for calorie benefit.   2. Hyperglycemia Recheck today.  - Comprehensive metabolic panel; Future - Hemoglobin A1c; Future  3. Low serum vitamin B12 - Vitamin B12; Future - Folate; Future  4. OSA (obstructive sleep apnea) Continue with cpap.  - CBC with Differential/Platelet; Future  5. Mild intermittent asthma without complication Well controlled; following with allergist.   6. Gastroesophageal reflux disease, unspecified whether esophagitis present Stable with diet control.  7. Need for pneumococcal vaccination - Pneumococcal conjugate vaccine 20-valent (Prevnar 20); Future  8. Lipid screening - Lipid panel; Future  9. Weight gain Will check bloodwork today; follow up pending  - TSH; Future  10. Abnormal urine odor Wife has been  noting this; we will check for baseline. - Urinalysis; Future  11. Plantar fasciitis of right foot Stretches, icing.   12. Decreased hearing States hx of auditory processing. Ear exam normal. He will work on allergy treatment and let me know if hearing not improving.   Return in about 3 months (around 05/29/2021).  Micheline Rough, MD

## 2021-03-01 DIAGNOSIS — G4733 Obstructive sleep apnea (adult) (pediatric): Secondary | ICD-10-CM | POA: Diagnosis not present

## 2021-03-05 DIAGNOSIS — J301 Allergic rhinitis due to pollen: Secondary | ICD-10-CM | POA: Diagnosis not present

## 2021-03-05 DIAGNOSIS — J3081 Allergic rhinitis due to animal (cat) (dog) hair and dander: Secondary | ICD-10-CM | POA: Diagnosis not present

## 2021-03-05 DIAGNOSIS — J3089 Other allergic rhinitis: Secondary | ICD-10-CM | POA: Diagnosis not present

## 2021-03-15 DIAGNOSIS — J301 Allergic rhinitis due to pollen: Secondary | ICD-10-CM | POA: Diagnosis not present

## 2021-03-15 DIAGNOSIS — J3089 Other allergic rhinitis: Secondary | ICD-10-CM | POA: Diagnosis not present

## 2021-03-15 DIAGNOSIS — J3081 Allergic rhinitis due to animal (cat) (dog) hair and dander: Secondary | ICD-10-CM | POA: Diagnosis not present

## 2021-03-22 DIAGNOSIS — J3081 Allergic rhinitis due to animal (cat) (dog) hair and dander: Secondary | ICD-10-CM | POA: Diagnosis not present

## 2021-03-22 DIAGNOSIS — J3089 Other allergic rhinitis: Secondary | ICD-10-CM | POA: Diagnosis not present

## 2021-03-23 ENCOUNTER — Telehealth: Payer: 59 | Admitting: Physician Assistant

## 2021-03-23 ENCOUNTER — Encounter: Payer: Self-pay | Admitting: Family Medicine

## 2021-03-23 DIAGNOSIS — Z20828 Contact with and (suspected) exposure to other viral communicable diseases: Secondary | ICD-10-CM | POA: Diagnosis not present

## 2021-03-23 MED ORDER — OSELTAMIVIR PHOSPHATE 75 MG PO CAPS: 75.0000 mg | ORAL_CAPSULE | Freq: Two times a day (BID) | ORAL | 0 refills | Status: DC

## 2021-03-23 NOTE — Progress Notes (Signed)
Virtual Visit Consent   Sean Allen, you are scheduled for a virtual visit with a [facility] provider today.     Just as with appointments in the office, your consent must be obtained to participate.  Your consent will be active for this visit and any virtual visit you may have with one of our providers in the next 365 days.     If you have a MyChart account, a copy of this consent can be sent to you electronically.  All virtual visits are billed to your insurance company just like a traditional visit in the office.    As this is a virtual visit, video technology does not allow for your provider to perform a traditional examination.  This may limit your provider's ability to fully assess your condition.  If your provider identifies any concerns that need to be evaluated in person or the need to arrange testing (such as labs, EKG, etc.), we will make arrangements to do so.     Although advances in technology are sophisticated, we cannot ensure that it will always work on either your end or our end.  If the connection with a video visit is poor, the visit may have to be switched to a telephone visit.  With either a video or telephone visit, we are not always able to ensure that we have a secure connection.     I need to obtain your verbal consent now.   Are you willing to proceed with your visit today?    Sean Allen has provided verbal consent on 33/12/2020 for a virtual visit (video or telephone).   Mar Daring, PA-C   Date: 03/23/2021 5:23 PM   Virtual Visit via Video Note   I, Mar Daring, connected with  Sean Allen  ([id number], 33-May-1987) on 03/23/21 at  5:30 PM EST by a video-enabled telemedicine application and verified that I am speaking with the correct person using two identifiers.  Location: Patient: Virtual Visit Location Patient: Home Provider: Virtual Visit Location Provider: Home Office   I discussed the limitations of evaluation and management by  telemedicine and the availability of in person appointments. The patient expressed understanding and agreed to proceed.    History of Present Illness: Sean Allen is a 35 y.o. who identifies as a male who was assigned male at birth, and is being seen today for considerations of prophylaxis for influenza. His wife tested positive for flu A yesterday. He does have asthma. He has been vaccinated for influenza. He currently has no symptoms.   Problems:  Patient Active Problem List   Diagnosis Date Noted   Allergic rhinitis due to animal (cat) (dog) hair and dander 11/22/2020   Mild intermittent asthma 11/22/2020   OSA (obstructive sleep apnea) 02/14/2015   Varicocele, evaluated by urologist in the past 08/30/2013   Hx of retinal detachment - [facility] 08/30/2013   Asthma - sees Allergy and Asthma specialist 08/30/2013   Allergic rhinitis 08/30/2013   GERD (gastroesophageal reflux disease) 08/30/2013    Allergies:  Allergies  Allergen Reactions   Ciprofloxacin     GI upset   Dust Mite Extract    Grass Extracts [Gramineae Pollens]    Mold Extract [Trichophyton]    Other     Cats    Medications:  Current Outpatient Medications:    oseltamivir (TAMIFLU) 75 MG capsule, Take 1 capsule (75 mg total) by mouth 2 (two) times daily., Disp: 10 capsule, Rfl: 0  albuterol (VENTOLIN HFA) 108 (90 Base) MCG/ACT inhaler, INHALE 1 TO 2 PUFFS BY MOUTH Allen 6 HOURS AS NEEDED FOR COUGH OR WHEEZE, Disp: 18 g, Rfl: 0   Ascorbic Acid (VITAMIN C) 1000 MG tablet, Take 2,000 mg by mouth daily., Disp: , Rfl:    Multiple Vitamins-Minerals (MULTIVITAMIN ADULT PO), Take by mouth., Disp: , Rfl:    Omega-3 Fatty Acids (FISH OIL PO), Take by mouth daily., Disp: , Rfl:    pantoprazole (PROTONIX) 20 MG tablet, TAKE 1 TABLET BY MOUTH ONCE A DAY AS NEEDED, Disp: 90 tablet, Rfl: 1   PRESCRIPTION MEDICATION, Allergy injections by Dr Donneta Romberg, Disp: , Rfl:   Observations/Objective: Patient is  well-developed, well-nourished in no acute distress.  Resting comfortably at home.  Head is normocephalic, atraumatic.  No labored breathing.  Speech is clear and coherent with logical content.  Patient is alert and oriented at baseline.    Assessment and Plan: 1. Exposure to the flu - oseltamivir (TAMIFLU) 75 MG capsule; Take 1 capsule (75 mg total) by mouth 2 (two) times daily.  Dispense: 10 capsule; Refill: 0  - Discussed that covering prophylactically for influenza A is not easily done as not great coverage and has to take through flu A season (long term) - Will give Tamiflu Rx for treatment for him to have if symptoms do arise so he may start immediately - Discussed signs and symptoms of influenza  Follow Up Instructions: I discussed the assessment and treatment plan with the patient. The patient was provided an opportunity to ask questions and all were answered. The patient agreed with the plan and demonstrated an understanding of the instructions.  A copy of instructions were sent to the patient via MyChart unless otherwise noted below.    The patient was advised to call back or seek an in-person evaluation if the symptoms worsen or if the condition fails to improve as anticipated.  Time:  I spent 10 minutes with the patient via telehealth technology discussing the above problems/concerns.    Mar Daring, PA-C

## 2021-03-23 NOTE — Progress Notes (Signed)
Based on the information that you have shared in the e-Visit Questionnaire, we recommend that you convert this visit to a video visit in order for the provider to better assess what is going on.  The provider will be able to give you a more accurate diagnosis and treatment plan if we can more freely discuss your symptoms and with the addition of a virtual examination.   I think giving history of asthma it is reasonable to start a preventive dose or Tamiflu. However this is not something we can do via e-visit so I want you to schedule a video visit instead so we can get you taken care of.   If you convert to a video visit, we will bill your insurance (similar to an office visit) and you will not be charged for this e-Visit. You will be able to stay at home and speak with the first available [facility] advanced practice provider. The link to do a video visit is in the drop down Menu tab of your Welcome screen in [place].

## 2021-03-23 NOTE — Patient Instructions (Signed)
Sean Allen, thank you for joining Mar Daring, PA-C for today's virtual visit.  While this provider is not your primary care provider (PCP), if your PCP is located in our provider database this encounter information will be shared with them immediately following your visit.  Consent: (Patient) Sean Allen provided verbal consent for this virtual visit at the beginning of the encounter.  Current Medications:  Current Outpatient Medications:    oseltamivir (TAMIFLU) 75 MG capsule, Take 1 capsule (75 mg total) by mouth 2 (two) times daily., Disp: 10 capsule, Rfl: 0   albuterol (VENTOLIN HFA) 108 (90 Base) MCG/ACT inhaler, INHALE 1 TO 2 PUFFS BY MOUTH EVERY 6 HOURS AS NEEDED FOR COUGH OR WHEEZE, Disp: 18 g, Rfl: 0   Ascorbic Acid (VITAMIN C) 1000 MG tablet, Take 2,000 mg by mouth daily., Disp: , Rfl:    Multiple Vitamins-Minerals (MULTIVITAMIN ADULT PO), Take by mouth., Disp: , Rfl:    Omega-3 Fatty Acids (FISH OIL PO), Take by mouth daily., Disp: , Rfl:    pantoprazole (PROTONIX) 20 MG tablet, TAKE 1 TABLET BY MOUTH ONCE A DAY AS NEEDED, Disp: 90 tablet, Rfl: 1   PRESCRIPTION MEDICATION, Allergy injections by Dr Donneta Romberg, Disp: , Rfl:    Medications ordered in this encounter:  Meds ordered this encounter  Medications   oseltamivir (TAMIFLU) 75 MG capsule    Sig: Take 1 capsule (75 mg total) by mouth 2 (two) times daily.    Dispense:  10 capsule    Refill:  0    Order Specific Question:   Supervising Provider    Answer:   Sabra Heck, [place]     *If you need refills on other medications prior to your next appointment, please contact your pharmacy*  Follow-Up: Call back or seek an in-person evaluation if the symptoms worsen or if the condition fails to improve as anticipated.  Other Instructions Influenza, Adult Influenza, also called "the flu," is a viral infection that mainly affects the respiratory tract. This includes the lungs, nose, and throat. The flu spreads easily  from person to person (is contagious). It causes common cold symptoms, along with high fever and body aches. What are the causes? This condition is caused by the influenza virus. You can get the virus by: Breathing in droplets that are in the air from an infected person's cough or sneeze. Touching something that has the virus on it (has been contaminated) and then touching your mouth, nose, or eyes. What increases the risk? The following factors may make you more likely to get the flu: Not washing or sanitizing your hands often. Having close contact with many people during cold and flu season. Touching your mouth, eyes, or nose without first washing or sanitizing your hands. Not getting an annual flu shot. You may have a higher risk for the flu, including serious problems, such as a lung infection (pneumonia), if you: Are older than 65. Are pregnant. Have a weakened disease-fighting system (immune system). This includes people who have HIV or AIDS, are on chemotherapy, or are taking medicines that reduce (suppress) the immune system. Have a long-term (chronic) illness, such as heart disease, kidney disease, diabetes, or lung disease. Have a liver disorder. Are severely overweight (morbidly obese). Have anemia. Have asthma. What are the signs or symptoms? Symptoms of this condition usually begin suddenly and last 4-14 days. These may include: Fever and chills. Headaches, body aches, or muscle aches. Sore throat. Cough. Runny or stuffy (congested) nose. Chest discomfort.  Poor appetite. Weakness or fatigue. Dizziness. Nausea or vomiting. How is this diagnosed? This condition may be diagnosed based on: Your symptoms and medical history. A physical exam. Swabbing your nose or throat and testing the fluid for the influenza virus. How is this treated? If the flu is diagnosed early, you can be treated with antiviral medicine that is given by mouth (orally) or through an IV. This can  help reduce how severe the illness is and how long it lasts. Taking care of yourself at home can help relieve symptoms. Your health care provider may recommend: Taking over-the-counter medicines. Drinking plenty of fluids. In many cases, the flu goes away on its own. If you have severe symptoms or complications, you may be treated in a hospital. Follow these instructions at home: Activity Rest as needed and get plenty of sleep. Stay home from work or school as told by your health care provider. Unless you are visiting your health care provider, avoid leaving home until your fever has been gone for 24 hours without taking medicine. Eating and drinking Take an oral rehydration solution (ORS). This is a drink that is sold at pharmacies and retail stores. Drink enough fluid to keep your urine pale yellow. Drink clear fluids in small amounts as you are able. Clear fluids include water, ice chips, fruit juice mixed with water, and low-calorie sports drinks. Eat bland, easy-to-digest foods in small amounts as you are able. These foods include bananas, applesauce, rice, lean meats, toast, and crackers. Avoid drinking fluids that contain a lot of sugar or caffeine, such as energy drinks, regular sports drinks, and soda. Avoid alcohol. Avoid spicy or fatty foods. General instructions   Take over-the-counter and prescription medicines only as told by your health care provider. Use a cool mist humidifier to add humidity to the air in your home. This can make it easier to breathe. When using a cool mist humidifier, clean it daily. Empty the water and replace it with clean water. Cover your mouth and nose when you cough or sneeze. Wash your hands with soap and water often and for at least 20 seconds, especially after you cough or sneeze. If soap and water are not available, use alcohol-based hand sanitizer. Keep all follow-up visits. This is important. How is this prevented?  Get an annual flu shot.  This is usually available in late summer, fall, or winter. Ask your health care provider when you should get your flu shot. Avoid contact with people who are sick during cold and flu season. This is generally fall and winter. Contact a health care provider if: You develop new symptoms. You have: Chest pain. Diarrhea. A fever. Your cough gets worse. You produce more mucus. You feel nauseous or you vomit. Get help right away if you: Develop shortness of breath or have difficulty breathing. Have skin or nails that turn a bluish color. Have severe pain or stiffness in your neck. Develop a sudden headache or sudden pain in your face or ear. Cannot eat or drink without vomiting. These symptoms may represent a serious problem that is an emergency. Do not wait to see if the symptoms will go away. Get medical help right away. Call your local emergency services (911 in the [place] Do not drive yourself to the hospital. Summary Influenza, also called "the flu," is a viral infection that primarily affects your respiratory tract. Symptoms of the flu usually begin suddenly and last 4-14 days. Getting an annual flu shot is the best way to prevent  getting the flu. Stay home from work or school as told by your health care provider. Unless you are visiting your health care provider, avoid leaving home until your fever has been gone for 24 hours without taking medicine. Keep all follow-up visits. This is important. This information is not intended to replace advice given to you by your health care provider. Make sure you discuss any questions you have with your health care provider. Document Revised: 11/19/2019 Document Reviewed: 11/19/2019 Elsevier Patient Education  2022 [employer].    If you have been instructed to have an in-person evaluation today at a local Urgent Care facility, please use the link below. It will take you to a list of all of our available [facility], including  address, phone number and hours of operation. Please do not delay care.  [facility]  If you or a family member do not have a primary care provider, use the link below to schedule a visit and establish care. When you choose a [facility] primary care physician or advanced practice provider, you gain a long-term partner in health. Find a Primary Care Provider  Learn more about [facility]'s in-office and virtual care options: [facility]

## 2021-03-29 DIAGNOSIS — J3081 Allergic rhinitis due to animal (cat) (dog) hair and dander: Secondary | ICD-10-CM | POA: Diagnosis not present

## 2021-03-29 DIAGNOSIS — J3089 Other allergic rhinitis: Secondary | ICD-10-CM | POA: Diagnosis not present

## 2021-03-31 DIAGNOSIS — G4733 Obstructive sleep apnea (adult) (pediatric): Secondary | ICD-10-CM | POA: Diagnosis not present

## 2021-04-02 DIAGNOSIS — J3081 Allergic rhinitis due to animal (cat) (dog) hair and dander: Secondary | ICD-10-CM | POA: Diagnosis not present

## 2021-04-02 DIAGNOSIS — J3089 Other allergic rhinitis: Secondary | ICD-10-CM | POA: Diagnosis not present

## 2021-04-15 ENCOUNTER — Other Ambulatory Visit: Payer: Self-pay | Admitting: Family Medicine

## 2021-04-15 DIAGNOSIS — K219 Gastro-esophageal reflux disease without esophagitis: Secondary | ICD-10-CM

## 2021-04-17 ENCOUNTER — Other Ambulatory Visit (HOSPITAL_COMMUNITY): Payer: Self-pay

## 2021-04-17 MED ORDER — PANTOPRAZOLE SODIUM 20 MG PO TBEC
DELAYED_RELEASE_TABLET | Freq: Every day | ORAL | 1 refills | Status: DC | PRN
  Filled 2021-04-17: qty 90, 90d supply, fill #0
  Filled 2021-07-24: qty 90, 90d supply, fill #1

## 2021-04-25 DIAGNOSIS — J3081 Allergic rhinitis due to animal (cat) (dog) hair and dander: Secondary | ICD-10-CM | POA: Diagnosis not present

## 2021-04-25 DIAGNOSIS — J3089 Other allergic rhinitis: Secondary | ICD-10-CM | POA: Diagnosis not present

## 2021-04-30 DIAGNOSIS — J3089 Other allergic rhinitis: Secondary | ICD-10-CM | POA: Diagnosis not present

## 2021-04-30 DIAGNOSIS — J3081 Allergic rhinitis due to animal (cat) (dog) hair and dander: Secondary | ICD-10-CM | POA: Diagnosis not present

## 2021-05-01 DIAGNOSIS — G4733 Obstructive sleep apnea (adult) (pediatric): Secondary | ICD-10-CM | POA: Diagnosis not present

## 2021-05-02 DIAGNOSIS — J3089 Other allergic rhinitis: Secondary | ICD-10-CM | POA: Diagnosis not present

## 2021-05-02 DIAGNOSIS — J3081 Allergic rhinitis due to animal (cat) (dog) hair and dander: Secondary | ICD-10-CM | POA: Diagnosis not present

## 2021-05-08 DIAGNOSIS — J3089 Other allergic rhinitis: Secondary | ICD-10-CM | POA: Diagnosis not present

## 2021-05-08 DIAGNOSIS — J3081 Allergic rhinitis due to animal (cat) (dog) hair and dander: Secondary | ICD-10-CM | POA: Diagnosis not present

## 2021-05-10 DIAGNOSIS — G4733 Obstructive sleep apnea (adult) (pediatric): Secondary | ICD-10-CM | POA: Diagnosis not present

## 2021-05-14 DIAGNOSIS — J3081 Allergic rhinitis due to animal (cat) (dog) hair and dander: Secondary | ICD-10-CM | POA: Diagnosis not present

## 2021-05-14 DIAGNOSIS — J3089 Other allergic rhinitis: Secondary | ICD-10-CM | POA: Diagnosis not present

## 2021-05-15 ENCOUNTER — Ambulatory Visit: Payer: 59 | Admitting: Pulmonary Disease

## 2021-05-15 ENCOUNTER — Other Ambulatory Visit: Payer: Self-pay

## 2021-05-15 ENCOUNTER — Encounter: Payer: Self-pay | Admitting: Pulmonary Disease

## 2021-05-15 VITALS — BP 136/78 | HR 74 | Temp 98.2°F | Ht 75.0 in | Wt 297.2 lb

## 2021-05-15 DIAGNOSIS — G4733 Obstructive sleep apnea (adult) (pediatric): Secondary | ICD-10-CM

## 2021-05-15 DIAGNOSIS — G4730 Sleep apnea, unspecified: Secondary | ICD-10-CM | POA: Diagnosis not present

## 2021-05-15 DIAGNOSIS — E669 Obesity, unspecified: Secondary | ICD-10-CM | POA: Diagnosis not present

## 2021-05-15 NOTE — Progress Notes (Signed)
Pulmonary, Critical Care, and Sleep Medicine  Chief Complaint  Patient presents with   Follow-up    CPAP therapy    Constitutional:  BP 136/78 (BP Location: Left Arm, Cuff Size: Normal)    Pulse 74    Temp 98.2 F (36.8 C) (Oral)    Ht 6\' 3"  (1.905 m)    Wt 297 lb 3.2 oz (134.8 kg)    SpO2 99%    BMI 37.15 kg/m   Past Medical History:  Varicocele, Retinal detachment, GERD, Asthma, Allergic rhinitis  Past Surgical History:  He  has a past surgical history that includes Tonsillectomy and adenoidectomy; Eye surgery; and Testicle surgery (Right).  Brief Summary:  Sean Allen is a 36 y.o. male with obstructive sleep apnea.      Subjective:   Weight was 290 at last visit in July.  He is working with [employer] loss program.  He is exercising on regular basis.  His energy level is better and his clothes fit more loosely.    He uses CPAP nightly.  No issues with mask fit.   Physical Exam:   Appearance - well kempt   ENMT - no sinus tenderness, no oral exudate, no LAN, Mallampati 3 airway, no stridor  Respiratory - equal breath sounds bilaterally, no wheezing or rales  CV - s1s2 regular rate and rhythm, no murmurs  Ext - no clubbing, no edema  Skin - no rashes  Psych - normal mood and affect    Sleep Tests:  HST 02/10/15 >> AHI 44.4, SaO2 low 76%. Auto CPAP 04/14/21 to 05/13/21 >> used on 30 of 30 nights with average 7 hrs 17 min.  Average AHI 1.7 with median CPAP 9 and 95 th percentile CPAP 12 cm H2O.  Social History:  He  reports that he has never smoked. He has never used smokeless tobacco. He reports current alcohol use. He reports that he does not use drugs.  Family History:  His family history includes Arthritis in his mother; Bradycardia in his mother; Diabetes in his father, maternal grandfather, and paternal aunt; Healthy in his sister; Heart attack in his paternal grandfather and paternal grandmother; Heart disease in his maternal grandfather,  maternal grandmother, paternal grandfather, and paternal grandmother; High Cholesterol in his paternal grandmother; Hyperlipidemia in his father; Hypertension in his paternal grandmother; Kidney failure in his maternal grandfather and maternal grandmother; Lung disease in his father; Lymphoma in his maternal grandmother; Other in his father; Prostate cancer in his paternal great-grandfather; Prostate cancer (age of onset: 61) in his paternal grandfather; Sleep apnea in his father.     Assessment/Plan:   Obstructive sleep apnea.   - he is compliant with CPAP and reports benefit from therapy - he uses [employer] for his DME - continue auto CPAP 5 to 15 cm H2O  Obesity. - he exercises on regular basis - he is working with PCP about calorie restriction - discussed setting achievable goals regarding weight loss and celebrating achievements even if they are in small increments  Time Spent Involved in Patient Care on Day of Examination:  26 minutes  Follow up:   Patient Instructions  Follow up in 1 year  Medication List:   Allergies as of 05/15/2021       Reactions   Ciprofloxacin    GI upset   Dust Mite Extract    Grass Extracts [gramineae Pollens]    Mold Extract [trichophyton]    Other    Cats  Medication List        Accurate as of May 15, 2021  2:22 PM. If you have any questions, ask your nurse or doctor.          STOP taking these medications    oseltamivir 75 MG capsule Commonly known as: Tamiflu Stopped by: Chesley Mires, MD       TAKE these medications    albuterol 108 (90 Base) MCG/ACT inhaler Commonly known as: VENTOLIN HFA INHALE 1 TO 2 PUFFS BY MOUTH EVERY 6 HOURS AS NEEDED FOR COUGH OR WHEEZE   FISH OIL PO Take by mouth daily.   MULTIVITAMIN ADULT PO Take by mouth.   pantoprazole 20 MG tablet Commonly known as: PROTONIX TAKE 1 TABLET BY MOUTH ONCE A DAY AS NEEDED   PRESCRIPTION MEDICATION Allergy injections by Dr Donneta Romberg   vitamin  C 1000 MG tablet Take 2,000 mg by mouth daily.        Signature:  Chesley Mires, MD [place] Pager - [phone] 05/15/2021, 2:22 PM

## 2021-05-15 NOTE — Patient Instructions (Signed)
Follow up in 1 year.

## 2021-05-24 DIAGNOSIS — J3089 Other allergic rhinitis: Secondary | ICD-10-CM | POA: Diagnosis not present

## 2021-05-24 DIAGNOSIS — J3081 Allergic rhinitis due to animal (cat) (dog) hair and dander: Secondary | ICD-10-CM | POA: Diagnosis not present

## 2021-05-30 ENCOUNTER — Ambulatory Visit: Payer: 59 | Admitting: Family Medicine

## 2021-05-30 ENCOUNTER — Other Ambulatory Visit (HOSPITAL_COMMUNITY): Payer: Self-pay

## 2021-05-30 DIAGNOSIS — J301 Allergic rhinitis due to pollen: Secondary | ICD-10-CM | POA: Diagnosis not present

## 2021-05-30 DIAGNOSIS — J3089 Other allergic rhinitis: Secondary | ICD-10-CM | POA: Diagnosis not present

## 2021-05-30 DIAGNOSIS — J3081 Allergic rhinitis due to animal (cat) (dog) hair and dander: Secondary | ICD-10-CM | POA: Diagnosis not present

## 2021-05-30 MED ORDER — ALBUTEROL SULFATE HFA 108 (90 BASE) MCG/ACT IN AERS
INHALATION_SPRAY | RESPIRATORY_TRACT | 0 refills | Status: DC
  Filled 2021-05-30: qty 18, 90d supply, fill #0

## 2021-05-30 MED ORDER — EPINEPHRINE 0.3 MG/0.3ML IJ SOAJ
INTRAMUSCULAR | 1 refills | Status: DC
  Filled 2021-05-30: qty 2, 30d supply, fill #0

## 2021-06-01 DIAGNOSIS — G4733 Obstructive sleep apnea (adult) (pediatric): Secondary | ICD-10-CM | POA: Diagnosis not present

## 2021-06-06 DIAGNOSIS — J3081 Allergic rhinitis due to animal (cat) (dog) hair and dander: Secondary | ICD-10-CM | POA: Diagnosis not present

## 2021-06-06 DIAGNOSIS — J3089 Other allergic rhinitis: Secondary | ICD-10-CM | POA: Diagnosis not present

## 2021-06-12 DIAGNOSIS — J3081 Allergic rhinitis due to animal (cat) (dog) hair and dander: Secondary | ICD-10-CM | POA: Diagnosis not present

## 2021-06-12 DIAGNOSIS — J3089 Other allergic rhinitis: Secondary | ICD-10-CM | POA: Diagnosis not present

## 2021-06-13 ENCOUNTER — Other Ambulatory Visit (HOSPITAL_COMMUNITY): Payer: Self-pay

## 2021-06-19 DIAGNOSIS — J3089 Other allergic rhinitis: Secondary | ICD-10-CM | POA: Diagnosis not present

## 2021-06-19 DIAGNOSIS — J301 Allergic rhinitis due to pollen: Secondary | ICD-10-CM | POA: Diagnosis not present

## 2021-06-19 DIAGNOSIS — J3081 Allergic rhinitis due to animal (cat) (dog) hair and dander: Secondary | ICD-10-CM | POA: Diagnosis not present

## 2021-06-25 DIAGNOSIS — H52223 Regular astigmatism, bilateral: Secondary | ICD-10-CM | POA: Diagnosis not present

## 2021-06-25 DIAGNOSIS — J301 Allergic rhinitis due to pollen: Secondary | ICD-10-CM | POA: Diagnosis not present

## 2021-06-25 DIAGNOSIS — Z83511 Family history of glaucoma: Secondary | ICD-10-CM | POA: Diagnosis not present

## 2021-06-25 DIAGNOSIS — H40053 Ocular hypertension, bilateral: Secondary | ICD-10-CM | POA: Diagnosis not present

## 2021-06-25 DIAGNOSIS — H59813 Chorioretinal scars after surgery for detachment, bilateral: Secondary | ICD-10-CM | POA: Diagnosis not present

## 2021-06-25 DIAGNOSIS — J3081 Allergic rhinitis due to animal (cat) (dog) hair and dander: Secondary | ICD-10-CM | POA: Diagnosis not present

## 2021-06-25 DIAGNOSIS — H5213 Myopia, bilateral: Secondary | ICD-10-CM | POA: Diagnosis not present

## 2021-07-03 DIAGNOSIS — J3089 Other allergic rhinitis: Secondary | ICD-10-CM | POA: Diagnosis not present

## 2021-07-03 DIAGNOSIS — J301 Allergic rhinitis due to pollen: Secondary | ICD-10-CM | POA: Diagnosis not present

## 2021-07-03 DIAGNOSIS — J3081 Allergic rhinitis due to animal (cat) (dog) hair and dander: Secondary | ICD-10-CM | POA: Diagnosis not present

## 2021-07-09 ENCOUNTER — Telehealth: Payer: 59 | Admitting: Physician Assistant

## 2021-07-09 ENCOUNTER — Encounter: Payer: Self-pay | Admitting: Family Medicine

## 2021-07-09 DIAGNOSIS — J020 Streptococcal pharyngitis: Secondary | ICD-10-CM

## 2021-07-09 DIAGNOSIS — J3089 Other allergic rhinitis: Secondary | ICD-10-CM | POA: Diagnosis not present

## 2021-07-09 DIAGNOSIS — J3081 Allergic rhinitis due to animal (cat) (dog) hair and dander: Secondary | ICD-10-CM | POA: Diagnosis not present

## 2021-07-09 MED ORDER — AMOXICILLIN 500 MG PO CAPS
500.0000 mg | ORAL_CAPSULE | Freq: Two times a day (BID) | ORAL | 0 refills | Status: AC
Start: 2021-07-09 — End: 2021-07-19

## 2021-07-09 NOTE — Progress Notes (Signed)

## 2021-07-09 NOTE — Telephone Encounter (Signed)
Noted  

## 2021-07-19 DIAGNOSIS — J3089 Other allergic rhinitis: Secondary | ICD-10-CM | POA: Diagnosis not present

## 2021-07-19 DIAGNOSIS — J3081 Allergic rhinitis due to animal (cat) (dog) hair and dander: Secondary | ICD-10-CM | POA: Diagnosis not present

## 2021-07-25 ENCOUNTER — Other Ambulatory Visit (HOSPITAL_COMMUNITY): Payer: Self-pay

## 2021-07-25 DIAGNOSIS — J301 Allergic rhinitis due to pollen: Secondary | ICD-10-CM | POA: Diagnosis not present

## 2021-07-25 DIAGNOSIS — J3081 Allergic rhinitis due to animal (cat) (dog) hair and dander: Secondary | ICD-10-CM | POA: Diagnosis not present

## 2021-07-25 DIAGNOSIS — J3089 Other allergic rhinitis: Secondary | ICD-10-CM | POA: Diagnosis not present

## 2021-08-02 DIAGNOSIS — J3081 Allergic rhinitis due to animal (cat) (dog) hair and dander: Secondary | ICD-10-CM | POA: Diagnosis not present

## 2021-08-02 DIAGNOSIS — J301 Allergic rhinitis due to pollen: Secondary | ICD-10-CM | POA: Diagnosis not present

## 2021-08-02 DIAGNOSIS — J3089 Other allergic rhinitis: Secondary | ICD-10-CM | POA: Diagnosis not present

## 2021-08-06 ENCOUNTER — Ambulatory Visit: Payer: 59 | Admitting: Family Medicine

## 2021-08-06 ENCOUNTER — Encounter: Payer: Self-pay | Admitting: Family Medicine

## 2021-08-06 VITALS — BP 116/72 | HR 86 | Temp 97.9°F | Ht 75.0 in | Wt 294.2 lb

## 2021-08-06 DIAGNOSIS — Z6836 Body mass index (BMI) 36.0-36.9, adult: Secondary | ICD-10-CM

## 2021-08-06 DIAGNOSIS — R748 Abnormal levels of other serum enzymes: Secondary | ICD-10-CM

## 2021-08-06 DIAGNOSIS — M722 Plantar fascial fibromatosis: Secondary | ICD-10-CM

## 2021-08-06 DIAGNOSIS — H9193 Unspecified hearing loss, bilateral: Secondary | ICD-10-CM | POA: Diagnosis not present

## 2021-08-06 NOTE — Patient Instructions (Signed)
Let me know about weight loss success in about 2 weeks time with 1800 cal diet and continuing with exercise. Keep working towards high intensity intervals with cardio.  ?

## 2021-08-06 NOTE — Progress Notes (Signed)
?Sean Allen ?DOB: 1986/03/11 ?Encounter date: 08/06/2021 ? ?This is a 36 y.o. male who presents with ?Chief Complaint  ?Patient presents with  ? Follow-up  ? ? ?History of present illness: ? ?Last visit was in November for physical, blood pressure slightly elevated at that time.  He is actively working on weight loss and regular exercise.  We discussed cutting back on calorie intake and continuing with regular exercise. He is doing wellness program at work.  ? ?States that he found out the coffee was elevating his blood pressure.  Was checking regularly at home until he realized it was coffee. All numbers were better in afternoon.  ? ?Feels like he is on plateau and it's frustrating. Shape is changing, but frustrated that numbers aren't improving.  ? ?Working out 4-5 days/week, doing an hour most of the time unless pushed for time. Shooting for 2000 cal/day. Fitness pal gives him 2400. High lean protein. Does 30 minutes cardio and 30 minutes of weight. Has been working since 2017 on weight loss. Did cross fit in past which initiated weight loss but he still hit plateau at that time.  ? ?Doing healthy weight through [facility] currently. Really does feel like he eats healthy overall. Keeps things lean. Has struggled with weight since high school/college. Didn't eat as healthy then, but has worked since college to be healthier, and then in 2017 really started working in cross fit.  ? ? ?Allergies  ?Allergen Reactions  ? Ciprofloxacin   ?  GI upset  ? Dust Mite Extract   ? Grass Extracts [Gramineae Pollens]   ? Mold Extract [Trichophyton]   ? Other   ?  Cats   ? ?Current Meds  ?Medication Sig  ? albuterol (VENTOLIN HFA) 108 (90 Base) MCG/ACT inhaler Inhale 1-2 puffs as needed every 4-6 hours for cough/wheeze.  ? Ascorbic Acid (VITAMIN C) 1000 MG tablet Take 2,000 mg by mouth daily.  ? EPINEPHrine (EPIPEN 2-PAK) 0.3 mg/0.3 mL IJ SOAJ injection Use as directed. Inject as needed.  ? EPINEPHrine (EPIPEN 2-PAK) 0.3 mg/0.3 mL IJ  SOAJ injection Inject as directed as needed.  ? Multiple Vitamins-Minerals (MULTIVITAMIN ADULT PO) Take by mouth.  ? Omega-3 Fatty Acids (FISH OIL PO) Take by mouth daily.  ? pantoprazole (PROTONIX) 20 MG tablet TAKE 1 TABLET BY MOUTH ONCE A DAY AS NEEDED  ? PRESCRIPTION MEDICATION Allergy injections by Dr Donneta Romberg  ? ? ?Review of Systems  ?Constitutional:  Negative for chills, fatigue and fever.  ?Respiratory:  Negative for cough, chest tightness, shortness of breath and wheezing.   ?Cardiovascular:  Negative for chest pain, palpitations and leg swelling.  ? ?Objective: ? ?BP 116/72 (BP Location: Left Arm, Patient Position: Sitting, Cuff Size: Large)   Pulse 86   Temp 97.9 ?F (36.6 ?C) (Oral)   Ht '6\' 3"'$  (1.905 m)   Wt 294 lb 3.2 oz (133.4 kg)   SpO2 98%   BMI 36.77 kg/m?   Weight: 294 lb 3.2 oz (133.4 kg)  ? ?BP Readings from Last 3 Encounters:  ?08/06/21 116/72  ?05/15/21 136/78  ?02/26/21 140/90  ? ?Wt Readings from Last 3 Encounters:  ?08/06/21 294 lb 3.2 oz (133.4 kg)  ?05/15/21 297 lb 3.2 oz (134.8 kg)  ?02/26/21 298 lb 14.4 oz (135.6 kg)  ? ? ?Physical Exam ?Constitutional:   ?   General: He is not in acute distress. ?   Appearance: He is well-developed.  ?Cardiovascular:  ?   Rate and Rhythm: Normal rate and  regular rhythm.  ?   Heart sounds: Normal heart sounds. No murmur heard. ?  No friction rub.  ?Pulmonary:  ?   Effort: Pulmonary effort is normal. No respiratory distress.  ?   Breath sounds: Normal breath sounds. No wheezing or rales.  ?Musculoskeletal:  ?   Right lower leg: No edema.  ?   Left lower leg: No edema.  ?Neurological:  ?   Mental Status: He is alert and oriented to person, place, and time.  ?Psychiatric:     ?   Behavior: Behavior normal.  ? ? ?Assessment/Plan ? ? ?1. Class 2 severe obesity with serious comorbidity and body mass index (BMI) of 36.0 to 36.9 in adult, unspecified obesity type ([place]) ?Going to decrease caloric intake to 1800 cal/day and see if he has weight loss with this.   We discussed consideration for [facility] if he is not having some success.  He is already putting in time for exercise, but I feel the decreased caloric intake will be helpful to promote weight loss. ? ?2. Plantar fasciitis of right foot ?He has been trying to manage this for a while and not getting improvement.  Requests referral for podiatry. ?- Ambulatory referral to Podiatry ? ?3. Bilateral hearing loss, unspecified hearing loss type ?Has considered hearing loss since last visit and would like to have more formal evaluation. ?- Ambulatory referral to Audiology ? ?4.elevated liver enzymes ?Can recheck with new provider at next visit. We discussed that healthy eating and weight loss will help with these numbers. ? ?Return in about 6 months (around 02/05/2022) for physical exam. ?40 minutes spent in chart review, time with patient, discussion of weight loss treatment options, exam, charting. ? ? ? ? ? ?Micheline Rough, MD ?

## 2021-08-08 ENCOUNTER — Other Ambulatory Visit (HOSPITAL_COMMUNITY): Payer: Self-pay

## 2021-08-08 DIAGNOSIS — J3081 Allergic rhinitis due to animal (cat) (dog) hair and dander: Secondary | ICD-10-CM | POA: Diagnosis not present

## 2021-08-08 DIAGNOSIS — G4733 Obstructive sleep apnea (adult) (pediatric): Secondary | ICD-10-CM | POA: Diagnosis not present

## 2021-08-08 DIAGNOSIS — K219 Gastro-esophageal reflux disease without esophagitis: Secondary | ICD-10-CM | POA: Diagnosis not present

## 2021-08-08 DIAGNOSIS — J4520 Mild intermittent asthma, uncomplicated: Secondary | ICD-10-CM | POA: Diagnosis not present

## 2021-08-08 DIAGNOSIS — J3089 Other allergic rhinitis: Secondary | ICD-10-CM | POA: Diagnosis not present

## 2021-08-08 MED ORDER — CETIRIZINE HCL 10 MG PO TABS
10.0000 mg | ORAL_TABLET | Freq: Every day | ORAL | 6 refills | Status: DC
Start: 2021-08-08 — End: 2022-06-12
  Filled 2021-08-08: qty 100, 100d supply, fill #0
  Filled 2022-03-11: qty 100, 100d supply, fill #1
  Filled 2022-06-12: qty 100, 100d supply, fill #2

## 2021-08-14 ENCOUNTER — Encounter: Payer: Self-pay | Admitting: Family Medicine

## 2021-08-14 DIAGNOSIS — J301 Allergic rhinitis due to pollen: Secondary | ICD-10-CM | POA: Diagnosis not present

## 2021-08-14 DIAGNOSIS — J3089 Other allergic rhinitis: Secondary | ICD-10-CM | POA: Diagnosis not present

## 2021-08-14 DIAGNOSIS — J3081 Allergic rhinitis due to animal (cat) (dog) hair and dander: Secondary | ICD-10-CM | POA: Diagnosis not present

## 2021-08-16 ENCOUNTER — Other Ambulatory Visit (HOSPITAL_COMMUNITY): Payer: Self-pay

## 2021-08-16 ENCOUNTER — Encounter: Payer: Self-pay | Admitting: Family Medicine

## 2021-08-16 MED ORDER — TIRZEPATIDE 2.5 MG/0.5ML ~~LOC~~ SOAJ
2.5000 mg | SUBCUTANEOUS | 2 refills | Status: DC
  Filled 2021-08-16: qty 2, 28d supply, fill #0

## 2021-08-21 ENCOUNTER — Telehealth: Payer: Self-pay | Admitting: *Deleted

## 2021-08-21 DIAGNOSIS — J3089 Other allergic rhinitis: Secondary | ICD-10-CM | POA: Diagnosis not present

## 2021-08-21 DIAGNOSIS — J3081 Allergic rhinitis due to animal (cat) (dog) hair and dander: Secondary | ICD-10-CM | POA: Diagnosis not present

## 2021-08-21 DIAGNOSIS — J301 Allergic rhinitis due to pollen: Secondary | ICD-10-CM | POA: Diagnosis not present

## 2021-08-21 NOTE — Telephone Encounter (Signed)
Fax received requesting a prior auth for [facility] 2.'5mg'$ .  PA was sent to [email or web address]-Key: [id number] pending review by the patient's insurance. ?

## 2021-08-22 DIAGNOSIS — J3081 Allergic rhinitis due to animal (cat) (dog) hair and dander: Secondary | ICD-10-CM | POA: Diagnosis not present

## 2021-08-22 DIAGNOSIS — J3089 Other allergic rhinitis: Secondary | ICD-10-CM | POA: Diagnosis not present

## 2021-08-22 NOTE — Telephone Encounter (Signed)
Fax received from [facility] stating the request was denied and placed on PCP's desk. ?

## 2021-08-23 DIAGNOSIS — J3081 Allergic rhinitis due to animal (cat) (dog) hair and dander: Secondary | ICD-10-CM | POA: Diagnosis not present

## 2021-08-26 ENCOUNTER — Other Ambulatory Visit: Payer: Self-pay | Admitting: Family Medicine

## 2021-08-26 MED ORDER — WEGOVY 0.25 MG/0.5ML ~~LOC~~ SOAJ
0.2500 mg | SUBCUTANEOUS | 3 refills | Status: DC
  Filled 2021-08-26: qty 2, 28d supply, fill #0
  Filled 2021-10-19: qty 2, 28d supply, fill #1

## 2021-08-27 ENCOUNTER — Other Ambulatory Visit (HOSPITAL_COMMUNITY): Payer: Self-pay

## 2021-08-27 ENCOUNTER — Ambulatory Visit: Payer: 59 | Admitting: Podiatry

## 2021-08-27 ENCOUNTER — Ambulatory Visit (INDEPENDENT_AMBULATORY_CARE_PROVIDER_SITE_OTHER): Payer: 59

## 2021-08-27 ENCOUNTER — Ambulatory Visit: Payer: 59 | Attending: Audiologist | Admitting: Audiologist

## 2021-08-27 DIAGNOSIS — M79671 Pain in right foot: Secondary | ICD-10-CM | POA: Diagnosis not present

## 2021-08-27 DIAGNOSIS — H9193 Unspecified hearing loss, bilateral: Secondary | ICD-10-CM | POA: Insufficient documentation

## 2021-08-27 DIAGNOSIS — M722 Plantar fascial fibromatosis: Secondary | ICD-10-CM

## 2021-08-27 DIAGNOSIS — J3089 Other allergic rhinitis: Secondary | ICD-10-CM | POA: Diagnosis not present

## 2021-08-27 DIAGNOSIS — J3081 Allergic rhinitis due to animal (cat) (dog) hair and dander: Secondary | ICD-10-CM | POA: Diagnosis not present

## 2021-08-27 DIAGNOSIS — J301 Allergic rhinitis due to pollen: Secondary | ICD-10-CM | POA: Diagnosis not present

## 2021-08-27 MED ORDER — MELOXICAM 15 MG PO TABS
15.0000 mg | ORAL_TABLET | Freq: Every day | ORAL | 2 refills | Status: AC | PRN
  Filled 2021-08-27: qty 30, 30d supply, fill #0
  Filled 2022-01-05: qty 30, 30d supply, fill #1
  Filled 2022-08-03: qty 30, 30d supply, fill #2

## 2021-08-27 NOTE — Telephone Encounter (Signed)
Prior phone note/denial was for mounjaro. We just sent in wegovy.  ?

## 2021-08-27 NOTE — Procedures (Signed)
?  [facility] ?[address] ?[place], [place]  [address] ?[phone] ? ?AUDIOLOGICAL  EVALUATION ? ?NAME: Sean Allen     ?DOB:   [birth year]      ?MRN: [id number]                                                                                     ?DATE: 08/27/2021     ?REFERENT: Caren Macadam, MD ?STATUS: Outpatient ?DIAGNOSIS: Decreased Hearing   ? ?History: ?Ahmadou was seen for an audiological evaluation due to difficulty hearing in certain situations. Previn is struggling to hear more in noise. He notices this at work and at [employer] Epic has to rewind shows to understand what they said, he will sometimes use closed captions. He was diagnosed with ADD and auditory processing disorder as a child. He is noticing the auditory processing deficits more. [place] denies any pain, pressure, to tinnitus in either ear. There is no family history of early onset hearing loss. 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 Medical history negative for any warning signs for hearing loss.  ? ?Evaluation:  ?Otoscopy showed a clear view of the tympanic membranes, bilaterally ?Tympanometry results were consistent with normal hearing, bilaterally   ?Audiometric testing was completed using Conventional Audiometry techniques with insert earphones and TDH headphones. Test results are consistent with normal hearing. Speech Recognition Thresholds were obtained at 15dB HL in the right ear and at 15dB HL in the left ear. Word Recognition Testing was completed at 45 dB HL and Vibhav scored 100% in the right ear anf 96% in the left ear.  ?QuickSIN showed average of 0dB SNR presented bilaterally showing normal ability to understand speech in noise.   ? ?Results:  ?The test results were reviewed with Jeneen Rinks. He has normal hearing in each ear. On the speech in noise test he did very well. Kanden may still have some  symptoms of auditory processing disorder, these can be exacerbated by fatigue or stress. He can use an online training program to help him hear better in noise. He also should use face to face communication when possible. At this time, there is no need for audiology follow up, hearing is normal in each ear.  ? ?Recommendations: ?1.   Hearing and speech understanding in noise are normal. If difficulties understanding speech persist, try [email or web address]  ? ? 35 minutes spent testing and counseling on results.  ? ?If you have any questions please feel free to contact me at [phone]. ? ?Alfonse Alpers ?Audiologist, Au.D., CCC-A ?08/27/2021  1:24 PM ? ?Cc: Caren Macadam, MD  ?

## 2021-08-27 NOTE — Telephone Encounter (Signed)
Mychart message sent.

## 2021-08-27 NOTE — Patient Instructions (Addendum)
If was nice to meet you today. If you have any questions or any further concerns, please feel fee to give me a call. You can call our office at [phone] or please feel fee to send me a message through [employer].  ? ?--- ? ?For shoes I would go to [employer] to try on some such as [employer] or [employer]. If needed we can add an insert. I like powersteps, superfeet, aextrex. ? ?--- ? ?For instructions on how to put on your Plantar Fascial Brace, please visit [email or web address] ? ?Plantar Fasciitis (Heel Spur Syndrome) ?with Rehab ?The plantar fascia is a fibrous, ligament-like, soft-tissue structure that spans the bottom of the foot. Plantar fasciitis is a condition that causes pain in the foot due to inflammation of the tissue. ?SYMPTOMS  ?Pain and tenderness on the underneath side of the foot. ?Pain that worsens with standing or walking. ?CAUSES  ?Plantar fasciitis is caused by irritation and injury to the plantar fascia on the underneath side of the foot. Common mechanisms of injury include: ?Direct trauma to bottom of the foot. ?Damage to a small nerve that runs under the foot where the main fascia attaches to the heel bone. ?Stress placed on the plantar fascia due to bone spurs. ?RISK INCREASES WITH:  ?Activities that place stress on the plantar fascia (running, jumping, pivoting, or cutting). ?Poor strength and flexibility. ?Improperly fitted shoes. ?Tight calf muscles. ?Flat feet. ?Failure to warm-up properly before activity. ?Obesity. ?PREVENTION ?Warm up and stretch properly before activity. ?Allow for adequate recovery between workouts. ?Maintain physical fitness: ?Strength, flexibility, and endurance. ?Cardiovascular fitness. ?Maintain a health body weight. ?Avoid stress on the plantar fascia. ?Wear properly fitted shoes, including arch supports for individuals who have flat feet. ? ?PROGNOSIS  ?If treated properly, then the symptoms of plantar fasciitis usually resolve without surgery. However,  occasionally surgery is necessary. ? ?RELATED COMPLICATIONS  ?Recurrent symptoms that may result in a chronic condition. ?Problems of the lower back that are caused by compensating for the injury, such as limping. ?Pain or weakness of the foot during push-off following surgery. ?Chronic inflammation, scarring, and partial or complete fascia tear, occurring more often from repeated injections. ? ?TREATMENT  ?Treatment initially involves the use of ice and medication to help reduce pain and inflammation. The use of strengthening and stretching exercises may help reduce pain with activity, especially stretches of the Achilles tendon. These exercises may be performed at home or with a therapist. Your caregiver may recommend that you use heel cups of arch supports to help reduce stress on the plantar fascia. Occasionally, corticosteroid injections are given to reduce inflammation. If symptoms persist for greater than 6 months despite non-surgical (conservative), then surgery may be recommended.  ? ?MEDICATION  ?If pain medication is necessary, then nonsteroidal anti-inflammatory medications, such as aspirin and ibuprofen, or other minor pain relievers, such as acetaminophen, are often recommended. ?Do not take pain medication within 7 days before surgery. ?Prescription pain relievers may be given if deemed necessary by your caregiver. Use only as directed and only as much as you need. ?Corticosteroid injections may be given by your caregiver. These injections should be reserved for the most serious cases, because they may only be given a certain number of times. ? ?HEAT AND COLD ?Cold treatment (icing) relieves pain and reduces inflammation. Cold treatment should be applied for 10 to 15 minutes every 2 to 3 hours for inflammation and pain and immediately after any activity that aggravates your symptoms. Use ice packs or  massage the area with a piece of ice (ice massage). ?Heat treatment may be used prior to performing  the stretching and strengthening activities prescribed by your caregiver, physical therapist, or athletic trainer. Use a heat pack or soak the injury in warm water. ? ?SEEK IMMEDIATE MEDICAL CARE IF: ?Treatment seems to offer no benefit, or the condition worsens. ?Any medications produce adverse side effects. ? ?EXERCISES- ?RANGE OF MOTION (ROM) AND STRETCHING EXERCISES - Plantar Fasciitis (Heel Spur Syndrome) ?These exercises may help you when beginning to rehabilitate your injury. Your symptoms may resolve with or without further involvement from your physician, physical therapist or athletic trainer. While completing these exercises, remember:  ?Restoring tissue flexibility helps normal motion to return to the joints. This allows healthier, less painful movement and activity. ?An effective stretch should be held for at least 30 seconds. ?A stretch should never be painful. You should only feel a gentle lengthening or release in the stretched tissue. ? ?RANGE OF MOTION - Toe Extension, Flexion ?Sit with your right / left leg crossed over your opposite knee. ?Grasp your toes and gently pull them back toward the top of your foot. You should feel a stretch on the bottom of your toes and/or foot. ?Hold this stretch for 10 seconds. ?Now, gently pull your toes toward the bottom of your foot. You should feel a stretch on the top of your toes and or foot. ?Hold this stretch for 10 seconds. ?Repeat  times. Complete this stretch 3 times per day.  ? ?RANGE OF MOTION - Ankle Dorsiflexion, Active Assisted ?Remove shoes and sit on a chair that is preferably not on a carpeted surface. ?Place right / left foot under knee. Extend your opposite leg for support. ?Keeping your heel down, slide your right / left foot back toward the chair until you feel a stretch at your ankle or calf. If you do not feel a stretch, slide your bottom forward to the edge of the chair, while still keeping your heel down. ?Hold this stretch for 10  seconds. ?Repeat 3 times. Complete this stretch 2 times per day.  ? ?STRETCH  Gastroc, Standing ?Place hands on wall. ?Extend right / left leg, keeping the front knee somewhat bent. ?Slightly point your toes inward on your back foot. ?Keeping your right / left heel on the floor and your knee straight, shift your weight toward the wall, not allowing your back to arch. ?You should feel a gentle stretch in the right / left calf. Hold this position for 10 seconds. ?Repeat 3 times. Complete this stretch 2 times per day. ? ?STRETCH  Soleus, Standing ?Place hands on wall. ?Extend right / left leg, keeping the other knee somewhat bent. ?Slightly point your toes inward on your back foot. ?Keep your right / left heel on the floor, bend your back knee, and slightly shift your weight over the back leg so that you feel a gentle stretch deep in your back calf. ?Hold this position for 10 seconds. ?Repeat 3 times. Complete this stretch 2 times per day. ? ?Google, Standing  ?Note: This exercise can place a lot of stress on your foot and ankle. Please complete this exercise only if specifically instructed by your caregiver.  ?Place the ball of your right / left foot on a step, keeping your other foot firmly on the same step. ?Hold on to the wall or a rail for balance. ?Slowly lift your other foot, allowing your body weight to press your heel down over  the edge of the step. ?You should feel a stretch in your right / left calf. ?Hold this position for 10 seconds. ?Repeat this exercise with a slight bend in your right / left knee. ?Repeat 3 times. Complete this stretch 2 times per day.  ? ?STRENGTHENING EXERCISES - Plantar Fasciitis (Heel Spur Syndrome)  ?These exercises may help you when beginning to rehabilitate your injury. They may resolve your symptoms with or without further involvement from your physician, physical therapist or athletic trainer. While completing these exercises, remember:  ?Muscles can gain both  the endurance and the strength needed for everyday activities through controlled exercises. ?Complete these exercises as instructed by your physician, physical therapist or athletic trainer. Progress the resistance and rep

## 2021-08-29 NOTE — Progress Notes (Signed)
Subjective:  ? ?Patient ID: Sean Allen, male   DOB: 36 y.o.   MRN: [id number]  ? ?HPI ?36 year old male presents the office with concerns of right heel pain which started at 6 months ago.  Hurts in the morning when he first gets up or sitting too long.  He started icing, massage, stretching.  No recent injuries.  He has no numbness or tingling.  No radiating pain.  No other concerns. ? ? ?Review of Systems  ?All other systems reviewed and are negative. ? ?Past Medical History:  ?Diagnosis Date  ? Allergic rhinitis 08/30/2013  ? Asthma   ? GERD (gastroesophageal reflux disease)   ? Hx of retinal detachment - [facility] 08/30/2013  ? bilat  ? OSA (obstructive sleep apnea) 02/14/2015  ? Varicocele, evaluated by urologist in the past 08/30/2013  ? ? ?Past Surgical History:  ?Procedure Laterality Date  ? EYE SURGERY    ? bilateral retinal detachment  ? TESTICLE SURGERY Right   ? TONSILLECTOMY AND ADENOIDECTOMY    ? ? ? ?Current Outpatient Medications:  ?  meloxicam (MOBIC) 15 MG tablet, Take 1 tablet by mouth daily as needed for pain., Disp: 30 tablet, Rfl: 2 ?  albuterol (VENTOLIN HFA) 108 (90 Base) MCG/ACT inhaler, INHALE 1 TO 2 PUFFS BY MOUTH EVERY 6 HOURS AS NEEDED FOR COUGH OR WHEEZE, Disp: 18 g, Rfl: 0 ?  albuterol (VENTOLIN HFA) 108 (90 Base) MCG/ACT inhaler, Inhale 1-2 puffs as needed every 4-6 hours for cough/wheeze., Disp: 18 g, Rfl: 0 ?  Ascorbic Acid (VITAMIN C) 1000 MG tablet, Take 2,000 mg by mouth daily., Disp: , Rfl:  ?  cetirizine (ZYRTEC ALLERGY) 10 MG tablet, Take 1 tablet (10 mg total) by mouth daily., Disp: 30 tablet, Rfl: 6 ?  EPINEPHrine (EPIPEN 2-PAK) 0.3 mg/0.3 mL IJ SOAJ injection, Use as directed. Inject as needed., Disp: 2 each, Rfl: 1 ?  EPINEPHrine (EPIPEN 2-PAK) 0.3 mg/0.3 mL IJ SOAJ injection, Inject as directed as needed., Disp: 1 each, Rfl: 1 ?  Multiple Vitamins-Minerals (MULTIVITAMIN ADULT PO), Take by mouth., Disp: , Rfl:  ?  Omega-3 Fatty Acids (FISH OIL PO), Take by  mouth daily., Disp: , Rfl:  ?  pantoprazole (PROTONIX) 20 MG tablet, TAKE 1 TABLET BY MOUTH ONCE A DAY AS NEEDED, Disp: 90 tablet, Rfl: 1 ?  PRESCRIPTION MEDICATION, Allergy injections by Dr Donneta Romberg, Disp: , Rfl:  ?  Semaglutide-Weight Management (WEGOVY) 0.25 MG/0.5ML SOAJ, Inject 1 pen (0.25 mg) into the skin once a week., Disp: 2 mL, Rfl: 3 ? ?Allergies  ?Allergen Reactions  ? Ciprofloxacin   ?  GI upset  ? Dust Mite Extract   ? Grass Extracts [Gramineae Pollens]   ? Mold Extract [Trichophyton]   ? Other   ?  Cats   ? ? ? ? ? ?   ?Objective:  ?Physical Exam  ?General: AAO x3, NAD ? ?Dermatological: Skin is warm, dry and supple bilateral. There are no open sores, no preulcerative lesions, no rash or signs of infection present. ? ?Vascular: Dorsalis Pedis artery and Posterior Tibial artery pedal pulses are 2/4 bilateral with immedate capillary fill time.  There is no pain with calf compression, swelling, warmth, erythema.  ? ?Neruologic: Grossly intact via light touch bilateral.  ? ?Musculoskeletal: Tenderness to palpation along the plantar medial tubercle of the calcaneus at the insertion of plantar fascia on the right foot. There is no pain along the course of the plantar fascia within the arch of  the foot. Plantar fascia appears to be intact. There is no pain with lateral compression of the calcaneus or pain with vibratory sensation. There is no pain along the course or insertion of the achilles tendon. No other areas of tenderness to bilateral lower extremities. Muscular strength 5/5 in all groups tested bilateral. Equinus is present.  ? ?Gait: Unassisted, Nonantalgic.  ? ? ?   ?Assessment:  ? ?36 year old male right heel pain, plantar fasciitis ? ?   ?Plan:  ?-Treatment options discussed including all alternatives, risks, and complications ?-Etiology of symptoms were discussed ?-X-rays were obtained and reviewed with the patient.  3 views of the right foot were obtained.  No evidence of acute fracture.  Very  minimal calcaneal spurring is present. ?-Prescribed mobic. Discussed side effects of the medication and directed to stop if any are to occur and call the office.  ?-Plantar fascial brace dispensed for support and stability. ?-Discussed shoe modifications and orthotics ?-Stretching, icing on a regular basis ? ?Return in about 6 weeks (around 10/08/2021), or if symptoms worsen or fail to improve. ? ?Trula Slade DPM ? ?   ? ?

## 2021-08-30 ENCOUNTER — Other Ambulatory Visit (HOSPITAL_COMMUNITY): Payer: Self-pay

## 2021-08-31 ENCOUNTER — Other Ambulatory Visit (HOSPITAL_COMMUNITY): Payer: Self-pay

## 2021-09-03 DIAGNOSIS — J3089 Other allergic rhinitis: Secondary | ICD-10-CM | POA: Diagnosis not present

## 2021-09-03 DIAGNOSIS — J3081 Allergic rhinitis due to animal (cat) (dog) hair and dander: Secondary | ICD-10-CM | POA: Diagnosis not present

## 2021-09-11 DIAGNOSIS — J3081 Allergic rhinitis due to animal (cat) (dog) hair and dander: Secondary | ICD-10-CM | POA: Diagnosis not present

## 2021-09-11 DIAGNOSIS — J3089 Other allergic rhinitis: Secondary | ICD-10-CM | POA: Diagnosis not present

## 2021-09-11 DIAGNOSIS — J301 Allergic rhinitis due to pollen: Secondary | ICD-10-CM | POA: Diagnosis not present

## 2021-09-19 DIAGNOSIS — J301 Allergic rhinitis due to pollen: Secondary | ICD-10-CM | POA: Diagnosis not present

## 2021-09-19 DIAGNOSIS — J3089 Other allergic rhinitis: Secondary | ICD-10-CM | POA: Diagnosis not present

## 2021-09-19 DIAGNOSIS — J3081 Allergic rhinitis due to animal (cat) (dog) hair and dander: Secondary | ICD-10-CM | POA: Diagnosis not present

## 2021-09-22 ENCOUNTER — Telehealth: Payer: 59 | Admitting: Nurse Practitioner

## 2021-09-22 DIAGNOSIS — J00 Acute nasopharyngitis [common cold]: Secondary | ICD-10-CM

## 2021-09-22 MED ORDER — FLUTICASONE PROPIONATE 50 MCG/ACT NA SUSP
2.0000 | Freq: Every day | NASAL | 6 refills | Status: DC
Start: 2021-09-22 — End: 2021-11-13

## 2021-09-22 MED ORDER — BENZONATATE 100 MG PO CAPS: 100.0000 mg | ORAL_CAPSULE | Freq: Three times a day (TID) | ORAL | 0 refills | Status: DC | PRN

## 2021-09-22 NOTE — Progress Notes (Signed)
E-Visit for Upper Respiratory Infection   We are sorry you are not feeling well.  Here is how we plan to help!  Based on what you have shared with me, it looks like you may have a viral upper respiratory infection.  Upper respiratory infections are caused by a large number of viruses; however, rhinovirus is the most common cause.   Symptoms vary from person to person, with common symptoms including sore throat, cough, fatigue or lack of energy and feeling of general discomfort.  A low-grade fever of up to 100.4 may present, but is often uncommon.  Symptoms vary however, and are closely related to a person's age or underlying illnesses.  The most common symptoms associated with an upper respiratory infection are nasal discharge or congestion, cough, sneezing, headache and pressure in the ears and face.  These symptoms usually persist for about 3 to 10 days, but can last up to 2 weeks.  It is important to know that upper respiratory infections do not cause serious illness or complications in most cases.    Upper respiratory infections can be transmitted from person to person, with the most common method of transmission being a person's hands.  The virus is able to live on the skin and can infect other persons for up to 2 hours after direct contact.  Also, these can be transmitted when someone coughs or sneezes; thus, it is important to cover the mouth to reduce this risk.  To keep the spread of the illness at [place], good hand hygiene is very important.  This is an infection that is most likely caused by a virus. There are no specific treatments other than to help you with the symptoms until the infection runs its course.  We are sorry you are not feeling well.  Here is how we plan to help!   For nasal congestion, you may use an oral decongestants such as Mucinex D or if you have glaucoma or high blood pressure use plain Mucinex.  Saline nasal spray or nasal drops can help and can safely be used as often as  needed for congestion.  For your congestion, I have prescribed Fluticasone nasal spray one spray in each nostril twice a day  If you do not have a history of heart disease, hypertension, diabetes or thyroid disease, prostate/bladder issues or glaucoma, you may also use Sudafed to treat nasal congestion.  It is highly recommended that you consult with a pharmacist or your primary care physician to ensure this medication is safe for you to take.     If you have a cough, you may use cough suppressants such as Delsym and Robitussin.  If you have glaucoma or high blood pressure, you can also use Coricidin HBP.   For cough I have prescribed for you A prescription cough medication called Tessalon Perles 100 mg. You may take 1-2 capsules every 8 hours as needed for cough  Providers prescribe antibiotics to treat infections caused by bacteria. Antibiotics are very powerful in treating bacterial infections when they are used properly. To maintain their effectiveness, they should be used only when necessary. Overuse of antibiotics has resulted in the development of superbugs that are resistant to treatment!    After careful review of your answers, I would not recommend an antibiotic for your condition.  Antibiotics are not effective against viruses and therefore should not be used to treat them. Common examples of infections caused by viruses include colds and flu  If you have a sore  or scratchy throat, use a saltwater gargle-  to  teaspoon of salt dissolved in a 4-ounce to 8-ounce glass of warm water.  Gargle the solution for approximately 15-30 seconds and then spit.  It is important not to swallow the solution.  You can also use throat lozenges/cough drops and Chloraseptic spray to help with throat pain or discomfort.  Warm or cold liquids can also be helpful in relieving throat pain.  For headache, pain or general discomfort, you can use Ibuprofen or Tylenol as directed.   Some authorities believe that zinc  sprays or the use of Echinacea may shorten the course of your symptoms.   HOME CARE Only take medications as instructed by your medical team. Be sure to drink plenty of fluids. Water is fine as well as fruit juices, sodas and electrolyte beverages. You may want to stay away from caffeine or alcohol. If you are nauseated, try taking small sips of liquids. How do you know if you are getting enough fluid? Your urine should be a pale yellow or almost colorless. Get rest. Taking a steamy shower or using a humidifier may help nasal congestion and ease sore throat pain. You can place a towel over your head and breathe in the steam from hot water coming from a faucet. Using a saline nasal spray works much the same way. Cough drops, hard candies and sore throat lozenges may ease your cough. Avoid close contacts especially the very young and the elderly Cover your mouth if you cough or sneeze Always remember to wash your hands.   GET HELP RIGHT AWAY IF: You develop worsening fever. If your symptoms do not improve within 10 days You develop yellow or green discharge from your nose over 3 days. You have coughing fits You develop a severe head ache or visual changes. You develop shortness of breath, difficulty breathing or start having chest pain Your symptoms persist after you have completed your treatment plan  MAKE SURE YOU  Understand these instructions. Will watch your condition. Will get help right away if you are not doing well or get worse.  Thank you for choosing an e-visit.  Your e-visit answers were reviewed by a board certified advanced clinical practitioner to complete your personal care plan. Depending upon the condition, your plan could have included both over the counter or prescription medications.  Please review your pharmacy choice. Make sure the pharmacy is open so you can pick up prescription now. If there is a problem, you may contact your provider through [employer]  and have the prescription routed to another pharmacy.  Your safety is important to [place]. If you have drug allergies check your prescription carefully.   For the next 24 hours you can use MyChart to ask questions about today's visit, request a non-urgent call back, or ask for a work or school excuse. You will get an email in the next two days asking about your experience. I hope that your e-visit has been valuable and will speed your recovery.  5-10 minutes spent reviewing and documenting in chart.

## 2021-09-24 ENCOUNTER — Telehealth: Payer: 59 | Admitting: Physician Assistant

## 2021-09-24 DIAGNOSIS — B9689 Other specified bacterial agents as the cause of diseases classified elsewhere: Secondary | ICD-10-CM

## 2021-09-24 MED ORDER — AMOXICILLIN-POT CLAVULANATE 875-125 MG PO TABS: 1.0000 | ORAL_TABLET | Freq: Two times a day (BID) | ORAL | 0 refills | Status: DC

## 2021-09-24 NOTE — Progress Notes (Signed)

## 2021-09-25 DIAGNOSIS — J3081 Allergic rhinitis due to animal (cat) (dog) hair and dander: Secondary | ICD-10-CM | POA: Diagnosis not present

## 2021-09-25 DIAGNOSIS — J301 Allergic rhinitis due to pollen: Secondary | ICD-10-CM | POA: Diagnosis not present

## 2021-09-25 DIAGNOSIS — J3089 Other allergic rhinitis: Secondary | ICD-10-CM | POA: Diagnosis not present

## 2021-09-27 DIAGNOSIS — J3089 Other allergic rhinitis: Secondary | ICD-10-CM | POA: Diagnosis not present

## 2021-09-27 DIAGNOSIS — J3081 Allergic rhinitis due to animal (cat) (dog) hair and dander: Secondary | ICD-10-CM | POA: Diagnosis not present

## 2021-09-27 DIAGNOSIS — J301 Allergic rhinitis due to pollen: Secondary | ICD-10-CM | POA: Diagnosis not present

## 2021-10-08 ENCOUNTER — Telehealth: Payer: 59

## 2021-10-08 ENCOUNTER — Telehealth: Payer: 59 | Admitting: Physician Assistant

## 2021-10-08 DIAGNOSIS — B9689 Other specified bacterial agents as the cause of diseases classified elsewhere: Secondary | ICD-10-CM

## 2021-10-08 DIAGNOSIS — J019 Acute sinusitis, unspecified: Secondary | ICD-10-CM

## 2021-10-08 DIAGNOSIS — J329 Chronic sinusitis, unspecified: Secondary | ICD-10-CM

## 2021-10-08 MED ORDER — DOXYCYCLINE HYCLATE 100 MG PO TABS: 100.0000 mg | ORAL_TABLET | Freq: Two times a day (BID) | ORAL | 0 refills | Status: DC

## 2021-10-10 DIAGNOSIS — J3081 Allergic rhinitis due to animal (cat) (dog) hair and dander: Secondary | ICD-10-CM | POA: Diagnosis not present

## 2021-10-10 DIAGNOSIS — J3089 Other allergic rhinitis: Secondary | ICD-10-CM | POA: Diagnosis not present

## 2021-10-15 DIAGNOSIS — H52223 Regular astigmatism, bilateral: Secondary | ICD-10-CM | POA: Diagnosis not present

## 2021-10-15 DIAGNOSIS — H40053 Ocular hypertension, bilateral: Secondary | ICD-10-CM | POA: Diagnosis not present

## 2021-10-15 DIAGNOSIS — H5213 Myopia, bilateral: Secondary | ICD-10-CM | POA: Diagnosis not present

## 2021-10-15 DIAGNOSIS — Z83511 Family history of glaucoma: Secondary | ICD-10-CM | POA: Diagnosis not present

## 2021-10-15 DIAGNOSIS — H40013 Open angle with borderline findings, low risk, bilateral: Secondary | ICD-10-CM | POA: Diagnosis not present

## 2021-10-19 ENCOUNTER — Other Ambulatory Visit (HOSPITAL_COMMUNITY): Payer: Self-pay

## 2021-10-19 ENCOUNTER — Other Ambulatory Visit: Payer: Self-pay

## 2021-10-22 ENCOUNTER — Telehealth: Payer: Self-pay | Admitting: Family Medicine

## 2021-10-22 DIAGNOSIS — J3081 Allergic rhinitis due to animal (cat) (dog) hair and dander: Secondary | ICD-10-CM | POA: Diagnosis not present

## 2021-10-22 DIAGNOSIS — J301 Allergic rhinitis due to pollen: Secondary | ICD-10-CM | POA: Diagnosis not present

## 2021-10-22 DIAGNOSIS — J3089 Other allergic rhinitis: Secondary | ICD-10-CM | POA: Diagnosis not present

## 2021-10-22 NOTE — Telephone Encounter (Signed)
Pt requesting refill of pantoprazole (PROTONIX) 20 MG tablet   [facility] Phone:  [phone]  Fax:  [phone]

## 2021-10-23 ENCOUNTER — Other Ambulatory Visit: Payer: Self-pay | Admitting: Family

## 2021-10-23 ENCOUNTER — Other Ambulatory Visit (HOSPITAL_COMMUNITY): Payer: Self-pay

## 2021-10-23 DIAGNOSIS — K219 Gastro-esophageal reflux disease without esophagitis: Secondary | ICD-10-CM

## 2021-10-23 MED ORDER — PANTOPRAZOLE SODIUM 20 MG PO TBEC
DELAYED_RELEASE_TABLET | Freq: Every day | ORAL | 1 refills | Status: DC | PRN
  Filled 2021-10-23: qty 90, 90d supply, fill #0
  Filled 2022-01-27: qty 90, 90d supply, fill #1

## 2021-10-25 ENCOUNTER — Ambulatory Visit: Payer: 59 | Admitting: Podiatry

## 2021-10-26 DIAGNOSIS — J3089 Other allergic rhinitis: Secondary | ICD-10-CM | POA: Diagnosis not present

## 2021-10-26 DIAGNOSIS — J301 Allergic rhinitis due to pollen: Secondary | ICD-10-CM | POA: Diagnosis not present

## 2021-10-26 DIAGNOSIS — J3081 Allergic rhinitis due to animal (cat) (dog) hair and dander: Secondary | ICD-10-CM | POA: Diagnosis not present

## 2021-11-05 DIAGNOSIS — G4731 Primary central sleep apnea: Secondary | ICD-10-CM | POA: Diagnosis not present

## 2021-11-07 DIAGNOSIS — J3081 Allergic rhinitis due to animal (cat) (dog) hair and dander: Secondary | ICD-10-CM | POA: Diagnosis not present

## 2021-11-07 DIAGNOSIS — J301 Allergic rhinitis due to pollen: Secondary | ICD-10-CM | POA: Diagnosis not present

## 2021-11-07 DIAGNOSIS — J3089 Other allergic rhinitis: Secondary | ICD-10-CM | POA: Diagnosis not present

## 2021-11-13 ENCOUNTER — Ambulatory Visit: Payer: 59 | Admitting: Family Medicine

## 2021-11-13 ENCOUNTER — Encounter: Payer: Self-pay | Admitting: Family Medicine

## 2021-11-13 VITALS — BP 118/82 | HR 70 | Temp 98.1°F | Ht 75.0 in | Wt 297.1 lb

## 2021-11-13 DIAGNOSIS — E669 Obesity, unspecified: Secondary | ICD-10-CM | POA: Insufficient documentation

## 2021-11-13 DIAGNOSIS — J4520 Mild intermittent asthma, uncomplicated: Secondary | ICD-10-CM

## 2021-11-13 DIAGNOSIS — J3081 Allergic rhinitis due to animal (cat) (dog) hair and dander: Secondary | ICD-10-CM | POA: Diagnosis not present

## 2021-11-13 DIAGNOSIS — J3089 Other allergic rhinitis: Secondary | ICD-10-CM | POA: Diagnosis not present

## 2021-11-13 DIAGNOSIS — J301 Allergic rhinitis due to pollen: Secondary | ICD-10-CM | POA: Diagnosis not present

## 2021-11-13 NOTE — Patient Instructions (Addendum)
Add a handful of nuts (pistachio's, sunflower seeds, cashews, almonds, walnuts, pecans, macadamias) to your diet daily to increase fiber and protein.   Try Rebel ice cream or HaloTop ice cream.

## 2021-11-13 NOTE — Progress Notes (Unsigned)
Established Patient Office Visit  Subjective   Patient ID: Sean Allen, male    DOB: 09/13/1985  Age: 36 y.o. MRN: [id number]  Chief Complaint  Patient presents with   Establish Care    Here for follow up for weight loss management.   1800-2000 calories per day, at least 100 gm protein per day, exercising 4-5 days per week, states he has come a long way since he started but during the pandemic he started seeing some weight gain. States that he was given a prescription for [facility] but he hasn't started it yet. States he is planning on starting the medication soon.    We had a 30 minute discussion about lowering carbohydrates in his diet and how this can help break his plateau. Pt states his biggest cravings are craft beer, ice cream, etc. We discussed low sugar products that he can consider incorporating into his diet.        Review of Systems  All other systems reviewed and are negative.     Objective:     BP 118/82 (BP Location: Left Arm, Patient Position: Sitting, Cuff Size: Large)   Pulse 70   Temp 98.1 F (36.7 C) (Oral)   Ht '6\' 3"'$  (1.905 m)   Wt 297 lb 1.6 oz (134.8 kg)   SpO2 98%   BMI 37.13 kg/m     Physical Exam Vitals reviewed.  Constitutional:      Appearance: Normal appearance. He is well-groomed. He is obese.  HENT:     Head: Normocephalic and atraumatic.  Eyes:     Extraocular Movements: Extraocular movements intact.     Conjunctiva/sclera: Conjunctivae normal.     Pupils: Pupils are equal, round, and reactive to light.  Cardiovascular:     Rate and Rhythm: Normal rate and regular rhythm.     Pulses: Normal pulses.     Heart sounds: S1 normal and S2 normal. No murmur heard. Pulmonary:     Effort: Pulmonary effort is normal.     Breath sounds: Normal breath sounds and air entry. No rales.  Abdominal:     General: Bowel sounds are normal.     Palpations: Abdomen is soft.  Musculoskeletal:     Right lower leg: No edema.     Left lower leg: No  edema.  Neurological:     General: No focal deficit present.     Mental Status: He is alert and oriented to person, place, and time.     Gait: Gait is intact.  Psychiatric:        Mood and Affect: Mood and affect normal.        Behavior: Behavior normal.      No results found for any visits on 11/13/21.     The ASCVD Risk score (Arnett DK, et al., 2019) failed to calculate for the following reasons:   The 2019 ASCVD risk score is only valid for ages 79 to 18    Current Outpatient Medications  Medication Instructions   albuterol (VENTOLIN HFA) 108 (90 Base) MCG/ACT inhaler Inhale 1-2 puffs as needed every 4-6 hours for cough/wheeze.   cetirizine (ZYRTEC ALLERGY) 10 mg, Oral, Daily   EPINEPHrine (EPIPEN 2-PAK) 0.3 mg/0.3 mL IJ SOAJ injection Use as directed. Inject as needed.   meloxicam (MOBIC) 15 MG tablet Take 1 tablet by mouth daily as needed for pain.   Multiple Vitamins-Minerals (MULTIVITAMIN ADULT PO) Oral   Omega-3 Fatty Acids (FISH OIL PO) Oral, Daily   pantoprazole (PROTONIX)  20 MG tablet TAKE 1 TABLET BY MOUTH ONCE A DAY AS NEEDED   PRESCRIPTION MEDICATION Allergy injections by Dr Donneta Romberg   Semaglutide-Weight Management (WEGOVY) 0.25 MG/0.5ML SOAJ Inject 1 pen (0.25 mg) into the skin once a week.   vitamin C 2,000 mg, Oral, Daily    Assessment & Plan:   Problem List Items Addressed This Visit       Respiratory   Mild intermittent asthma - Primary    Lungs clear on exam today, continue albuterol 90 mcg inhaler as needed.        Other   Obesity (BMI 30-39.9) (Chronic)    I have had an extensive (>30 minute)  Conversation today with the patient about healthy eating habits, exercise, calorie and carb goals for sustainable and successful weight loss. I gave the patient caloric and protein daily intake values as well as described the importance of increasing fiber and water intake  He will start wegovy once weekly at home.        Return in about 3 months (around  02/13/2022) for weight check  - can do a video.    Farrel Conners, MD

## 2021-11-13 NOTE — Assessment & Plan Note (Signed)
I have had an extensive (>30 minute)  Conversation today with the patient about healthy eating habits, exercise, calorie and carb goals for sustainable and successful weight loss. I gave the patient caloric and protein daily intake values as well as described the importance of increasing fiber and water intake  He will start wegovy once weekly at home.

## 2021-11-14 NOTE — Assessment & Plan Note (Signed)
Lungs clear on exam today, continue albuterol 90 mcg inhaler as needed.

## 2021-11-15 DIAGNOSIS — J3081 Allergic rhinitis due to animal (cat) (dog) hair and dander: Secondary | ICD-10-CM | POA: Diagnosis not present

## 2021-11-15 DIAGNOSIS — J3089 Other allergic rhinitis: Secondary | ICD-10-CM | POA: Diagnosis not present

## 2021-11-19 DIAGNOSIS — J3081 Allergic rhinitis due to animal (cat) (dog) hair and dander: Secondary | ICD-10-CM | POA: Diagnosis not present

## 2021-11-19 DIAGNOSIS — J301 Allergic rhinitis due to pollen: Secondary | ICD-10-CM | POA: Diagnosis not present

## 2021-11-19 DIAGNOSIS — J3089 Other allergic rhinitis: Secondary | ICD-10-CM | POA: Diagnosis not present

## 2021-11-27 DIAGNOSIS — J301 Allergic rhinitis due to pollen: Secondary | ICD-10-CM | POA: Diagnosis not present

## 2021-11-27 DIAGNOSIS — J3081 Allergic rhinitis due to animal (cat) (dog) hair and dander: Secondary | ICD-10-CM | POA: Diagnosis not present

## 2021-11-27 DIAGNOSIS — J3089 Other allergic rhinitis: Secondary | ICD-10-CM | POA: Diagnosis not present

## 2021-12-06 DIAGNOSIS — J3089 Other allergic rhinitis: Secondary | ICD-10-CM | POA: Diagnosis not present

## 2021-12-06 DIAGNOSIS — J301 Allergic rhinitis due to pollen: Secondary | ICD-10-CM | POA: Diagnosis not present

## 2021-12-06 DIAGNOSIS — J3081 Allergic rhinitis due to animal (cat) (dog) hair and dander: Secondary | ICD-10-CM | POA: Diagnosis not present

## 2021-12-10 ENCOUNTER — Ambulatory Visit: Payer: 59 | Admitting: Podiatry

## 2021-12-10 DIAGNOSIS — J3089 Other allergic rhinitis: Secondary | ICD-10-CM | POA: Diagnosis not present

## 2021-12-10 DIAGNOSIS — J3081 Allergic rhinitis due to animal (cat) (dog) hair and dander: Secondary | ICD-10-CM | POA: Diagnosis not present

## 2021-12-10 DIAGNOSIS — J301 Allergic rhinitis due to pollen: Secondary | ICD-10-CM | POA: Diagnosis not present

## 2021-12-19 DIAGNOSIS — J3081 Allergic rhinitis due to animal (cat) (dog) hair and dander: Secondary | ICD-10-CM | POA: Diagnosis not present

## 2021-12-19 DIAGNOSIS — J3089 Other allergic rhinitis: Secondary | ICD-10-CM | POA: Diagnosis not present

## 2021-12-25 DIAGNOSIS — J3081 Allergic rhinitis due to animal (cat) (dog) hair and dander: Secondary | ICD-10-CM | POA: Diagnosis not present

## 2021-12-25 DIAGNOSIS — J3089 Other allergic rhinitis: Secondary | ICD-10-CM | POA: Diagnosis not present

## 2021-12-25 DIAGNOSIS — J301 Allergic rhinitis due to pollen: Secondary | ICD-10-CM | POA: Diagnosis not present

## 2021-12-31 ENCOUNTER — Ambulatory Visit: Payer: 59 | Admitting: Podiatry

## 2022-01-01 DIAGNOSIS — J301 Allergic rhinitis due to pollen: Secondary | ICD-10-CM | POA: Diagnosis not present

## 2022-01-01 DIAGNOSIS — J3081 Allergic rhinitis due to animal (cat) (dog) hair and dander: Secondary | ICD-10-CM | POA: Diagnosis not present

## 2022-01-01 DIAGNOSIS — J3089 Other allergic rhinitis: Secondary | ICD-10-CM | POA: Diagnosis not present

## 2022-01-07 ENCOUNTER — Other Ambulatory Visit (HOSPITAL_COMMUNITY): Payer: Self-pay

## 2022-01-07 DIAGNOSIS — J301 Allergic rhinitis due to pollen: Secondary | ICD-10-CM | POA: Diagnosis not present

## 2022-01-07 DIAGNOSIS — J3081 Allergic rhinitis due to animal (cat) (dog) hair and dander: Secondary | ICD-10-CM | POA: Diagnosis not present

## 2022-01-07 DIAGNOSIS — J3089 Other allergic rhinitis: Secondary | ICD-10-CM | POA: Diagnosis not present

## 2022-01-09 ENCOUNTER — Other Ambulatory Visit: Payer: Self-pay | Admitting: Podiatry

## 2022-01-09 DIAGNOSIS — M722 Plantar fascial fibromatosis: Secondary | ICD-10-CM

## 2022-01-16 DIAGNOSIS — J3081 Allergic rhinitis due to animal (cat) (dog) hair and dander: Secondary | ICD-10-CM | POA: Diagnosis not present

## 2022-01-16 DIAGNOSIS — J301 Allergic rhinitis due to pollen: Secondary | ICD-10-CM | POA: Diagnosis not present

## 2022-01-21 DIAGNOSIS — J301 Allergic rhinitis due to pollen: Secondary | ICD-10-CM | POA: Diagnosis not present

## 2022-01-21 DIAGNOSIS — J3081 Allergic rhinitis due to animal (cat) (dog) hair and dander: Secondary | ICD-10-CM | POA: Diagnosis not present

## 2022-01-21 DIAGNOSIS — J3089 Other allergic rhinitis: Secondary | ICD-10-CM | POA: Diagnosis not present

## 2022-01-25 ENCOUNTER — Ambulatory Visit: Payer: 59 | Admitting: Podiatry

## 2022-01-25 DIAGNOSIS — M722 Plantar fascial fibromatosis: Secondary | ICD-10-CM

## 2022-01-25 NOTE — Patient Instructions (Signed)

## 2022-01-25 NOTE — Progress Notes (Unsigned)
   If he wears the brace whith exercises it helps and no pain. If stretching and rolling ice it alleivates it. When going to the beach and walking in the sand he has to take mobic. His biggest concern is it does not move to the left foot. He takes mobic when it hurts and it makes it better. He got [employer] for shoes and oofos for sandals.

## 2022-01-27 DIAGNOSIS — M722 Plantar fascial fibromatosis: Secondary | ICD-10-CM | POA: Insufficient documentation

## 2022-01-28 ENCOUNTER — Other Ambulatory Visit (HOSPITAL_COMMUNITY): Payer: Self-pay

## 2022-01-30 ENCOUNTER — Other Ambulatory Visit (HOSPITAL_COMMUNITY): Payer: Self-pay

## 2022-01-30 DIAGNOSIS — J3089 Other allergic rhinitis: Secondary | ICD-10-CM | POA: Diagnosis not present

## 2022-01-30 DIAGNOSIS — J3081 Allergic rhinitis due to animal (cat) (dog) hair and dander: Secondary | ICD-10-CM | POA: Diagnosis not present

## 2022-01-30 DIAGNOSIS — J301 Allergic rhinitis due to pollen: Secondary | ICD-10-CM | POA: Diagnosis not present

## 2022-01-30 MED ORDER — ALBUTEROL SULFATE HFA 108 (90 BASE) MCG/ACT IN AERS
1.0000 | INHALATION_SPRAY | RESPIRATORY_TRACT | 0 refills | Status: DC | PRN
  Filled 2022-01-30: qty 6.7, 16d supply, fill #0

## 2022-02-04 DIAGNOSIS — G4733 Obstructive sleep apnea (adult) (pediatric): Secondary | ICD-10-CM | POA: Diagnosis not present

## 2022-02-04 DIAGNOSIS — J3081 Allergic rhinitis due to animal (cat) (dog) hair and dander: Secondary | ICD-10-CM | POA: Diagnosis not present

## 2022-02-04 DIAGNOSIS — J3089 Other allergic rhinitis: Secondary | ICD-10-CM | POA: Diagnosis not present

## 2022-02-04 DIAGNOSIS — J301 Allergic rhinitis due to pollen: Secondary | ICD-10-CM | POA: Diagnosis not present

## 2022-02-12 DIAGNOSIS — J3089 Other allergic rhinitis: Secondary | ICD-10-CM | POA: Diagnosis not present

## 2022-02-12 DIAGNOSIS — J301 Allergic rhinitis due to pollen: Secondary | ICD-10-CM | POA: Diagnosis not present

## 2022-02-12 DIAGNOSIS — J3081 Allergic rhinitis due to animal (cat) (dog) hair and dander: Secondary | ICD-10-CM | POA: Diagnosis not present

## 2022-02-18 DIAGNOSIS — J3089 Other allergic rhinitis: Secondary | ICD-10-CM | POA: Diagnosis not present

## 2022-02-18 DIAGNOSIS — J301 Allergic rhinitis due to pollen: Secondary | ICD-10-CM | POA: Diagnosis not present

## 2022-02-18 DIAGNOSIS — J3081 Allergic rhinitis due to animal (cat) (dog) hair and dander: Secondary | ICD-10-CM | POA: Diagnosis not present

## 2022-02-27 ENCOUNTER — Encounter: Payer: Self-pay | Admitting: Family Medicine

## 2022-02-27 ENCOUNTER — Ambulatory Visit (INDEPENDENT_AMBULATORY_CARE_PROVIDER_SITE_OTHER): Payer: 59 | Admitting: Family Medicine

## 2022-02-27 ENCOUNTER — Other Ambulatory Visit (HOSPITAL_COMMUNITY): Payer: Self-pay

## 2022-02-27 VITALS — BP 118/68 | HR 77 | Temp 97.6°F | Ht 74.0 in | Wt 292.7 lb

## 2022-02-27 DIAGNOSIS — Z0000 Encounter for general adult medical examination without abnormal findings: Secondary | ICD-10-CM | POA: Diagnosis not present

## 2022-02-27 DIAGNOSIS — J3081 Allergic rhinitis due to animal (cat) (dog) hair and dander: Secondary | ICD-10-CM | POA: Diagnosis not present

## 2022-02-27 DIAGNOSIS — J3089 Other allergic rhinitis: Secondary | ICD-10-CM | POA: Diagnosis not present

## 2022-02-27 DIAGNOSIS — R110 Nausea: Secondary | ICD-10-CM | POA: Diagnosis not present

## 2022-02-27 DIAGNOSIS — Z3141 Encounter for fertility testing: Secondary | ICD-10-CM | POA: Diagnosis not present

## 2022-02-27 DIAGNOSIS — E669 Obesity, unspecified: Secondary | ICD-10-CM | POA: Diagnosis not present

## 2022-02-27 DIAGNOSIS — J301 Allergic rhinitis due to pollen: Secondary | ICD-10-CM | POA: Diagnosis not present

## 2022-02-27 LAB — COMPREHENSIVE METABOLIC PANEL
ALT: 48 U/L (ref 0–53)
AST: 26 U/L (ref 0–37)
Albumin: 4.6 g/dL (ref 3.5–5.2)
Alkaline Phosphatase: 62 U/L (ref 39–117)
BUN: 16 mg/dL (ref 6–23)
CO2: 29 mEq/L (ref 19–32)
Calcium: 9.5 mg/dL (ref 8.4–10.5)
Chloride: 101 mEq/L (ref 96–112)
Creatinine, Ser: 1.14 mg/dL (ref 0.40–1.50)
GFR: 83.78 mL/min (ref >60.00)
Glucose, Bld: 94 mg/dL (ref 70–99)
Potassium: 4.2 mEq/L (ref 3.5–5.1)
Sodium: 136 mEq/L (ref 135–145)
Total Bilirubin: 0.7 mg/dL (ref 0.2–1.2)
Total Protein: 7.4 g/dL (ref 6.0–8.3)

## 2022-02-27 LAB — CBC
HCT: 44.1 % (ref 39.0–52.0)
Hemoglobin: 15 g/dL (ref 13.0–17.0)
MCHC: 34.1 g/dL (ref 30.0–36.0)
MCV: 91.7 fl (ref 78.0–100.0)
Platelets: 247 10*3/uL (ref 150.0–400.0)
RBC: 4.8 Mil/uL (ref 4.22–5.81)
RDW: 12.7 % (ref 11.5–15.5)
WBC: 5.9 10*3/uL (ref 4.0–10.5)

## 2022-02-27 LAB — LIPID PANEL
Cholesterol: 177 mg/dL (ref 0–200)
HDL: 44 mg/dL (ref >39.00)
LDL Cholesterol: 103 mg/dL — ABNORMAL HIGH (ref 0–99)
NonHDL: 133.24
Total CHOL/HDL Ratio: 4
Triglycerides: 152 mg/dL — ABNORMAL HIGH (ref 0.0–149.0)
VLDL: 30.4 mg/dL (ref 0.0–40.0)

## 2022-02-27 LAB — HEMOGLOBIN A1C: Hgb A1c MFr Bld: 5.3 % (ref 4.6–6.5)

## 2022-02-27 LAB — TSH: TSH: 1.93 u[IU]/mL (ref 0.35–5.50)

## 2022-02-27 MED ORDER — SEMAGLUTIDE-WEIGHT MANAGEMENT 1 MG/0.5ML ~~LOC~~ SOAJ
1.0000 mg | SUBCUTANEOUS | 0 refills | Status: DC
  Filled 2022-02-27 – 2022-04-18 (×3): qty 2, 28d supply, fill #0

## 2022-02-27 MED ORDER — ONDANSETRON 4 MG PO TBDP
4.0000 mg | ORAL_TABLET | Freq: Three times a day (TID) | ORAL | 2 refills | Status: DC | PRN
  Filled 2022-02-27: qty 30, 10d supply, fill #0
  Filled 2022-07-19: qty 30, 10d supply, fill #1

## 2022-02-27 MED ORDER — SEMAGLUTIDE-WEIGHT MANAGEMENT 1.7 MG/0.75ML ~~LOC~~ SOAJ
1.7000 mg | SUBCUTANEOUS | 0 refills | Status: DC
  Filled 2022-02-27: qty 3, 28d supply, fill #0

## 2022-02-27 MED ORDER — SEMAGLUTIDE-WEIGHT MANAGEMENT 0.5 MG/0.5ML ~~LOC~~ SOAJ
0.5000 mg | SUBCUTANEOUS | 0 refills | Status: DC
  Filled 2022-02-27: qty 2, 28d supply, fill #0

## 2022-02-27 MED ORDER — SEMAGLUTIDE-WEIGHT MANAGEMENT 2.4 MG/0.75ML ~~LOC~~ SOAJ
2.4000 mg | SUBCUTANEOUS | 0 refills | Status: DC
  Filled 2022-02-27: qty 3, 28d supply, fill #0

## 2022-02-27 NOTE — Progress Notes (Signed)
Complete physical exam  Patient: Sean Allen   DOB: Aug 26, 1985   36 y.o. Male  MRN: [id number]  Subjective:    Chief Complaint  Patient presents with   Annual Exam    Sean Allen is a 36 y.o. male who presents today for a complete physical exam. He reports consuming a general diet. Gym/ health club routine includes cardio and light weights. He generally feels well. He reports sleeping well. He does not have additional problems to discuss today.   Patient started the wegovy 0.25 mg weekly injections since the last visit. He reports his clothes are looser and his weight is down 5 pounds. He reports some nausea, mostly he is reporting constipation for the first 1-2 days after his injection. We discussed adding stools softeners/ probiotics/fiber to help with the constipation and I will rx ondansetron 4 mg every 6 hours as needed for the nausea.  Most recent fall risk assessment:     No data to display           Most recent depression screenings:    02/27/2022    8:07 AM 11/13/2021    2:18 PM  PHQ 2/9 Scores  PHQ - 2 Score 0 0  PHQ- 9 Score 0 0    Dental: No current dental problems and Receives regular dental care  Patient Active Problem List   Diagnosis Date Noted   Plantar fasciitis 01/27/2022   Obesity (BMI 30-39.9) 11/13/2021   Allergic rhinitis due to animal (cat) (dog) hair and dander 11/22/2020   Mild intermittent asthma 11/22/2020   OSA (obstructive sleep apnea) 02/14/2015   Varicocele, evaluated by urologist in the past 08/30/2013   Hx of retinal detachment - [facility] 08/30/2013   Asthma - sees Allergy and Asthma specialist 08/30/2013   Allergic rhinitis 08/30/2013   GERD (gastroesophageal reflux disease) 08/30/2013      Patient Care Team: Farrel Conners, MD as PCP - General (Family Medicine)   Outpatient Medications Prior to Visit  Medication Sig   albuterol (VENTOLIN HFA) 108 (90 Base) MCG/ACT inhaler Inhale 1-2 puffs into the  lungs every 4-6 hours as needed for cough/wheezing   Ascorbic Acid (VITAMIN C) 1000 MG tablet Take 2,000 mg by mouth daily.   cetirizine (ZYRTEC ALLERGY) 10 MG tablet Take 1 tablet (10 mg total) by mouth daily.   EPINEPHrine (EPIPEN 2-PAK) 0.3 mg/0.3 mL IJ SOAJ injection Use as directed. Inject as needed.   meloxicam (MOBIC) 15 MG tablet Take 1 tablet by mouth daily as needed for pain.   Multiple Vitamins-Minerals (MULTIVITAMIN ADULT PO) Take by mouth.   Omega-3 Fatty Acids (FISH OIL PO) Take by mouth daily.   pantoprazole (PROTONIX) 20 MG tablet TAKE 1 TABLET BY MOUTH ONCE A DAY AS NEEDED   PRESCRIPTION MEDICATION Allergy injections by Dr Donneta Romberg   Semaglutide-Weight Management (WEGOVY) 0.25 MG/0.5ML SOAJ Inject 1 pen (0.25 mg) into the skin once a week.   [DISCONTINUED] albuterol (VENTOLIN HFA) 108 (90 Base) MCG/ACT inhaler Inhale 1-2 puffs as needed every 4-6 hours for cough/wheeze.   No facility-administered medications prior to visit.    Review of Systems  HENT:  Negative for hearing loss.   Eyes:  Negative for blurred vision.  Respiratory:  Negative for shortness of breath.   Cardiovascular:  Negative for chest pain.  Gastrointestinal: Negative.   Genitourinary: Negative.   Musculoskeletal:  Negative for back pain.  Neurological:  Negative for headaches.  Psychiatric/Behavioral:  Negative for depression.  All other systems reviewed and are negative.         Objective:     BP 118/68 (BP Location: Left Arm, Patient Position: Sitting, Cuff Size: Large)   Pulse 77   Temp 97.6 F (36.4 C) (Oral)   Ht '6\' 2"'$  (1.88 m)   Wt 292 lb 11.2 oz (132.8 kg)   SpO2 99%   BMI 37.58 kg/m  BP Readings from Last 3 Encounters:  02/27/22 118/68  11/13/21 118/82  08/06/21 116/72   Wt Readings from Last 3 Encounters:  02/27/22 292 lb 11.2 oz (132.8 kg)  11/13/21 297 lb 1.6 oz (134.8 kg)  08/06/21 294 lb 3.2 oz (133.4 kg)      Physical Exam Vitals reviewed.  Constitutional:       Appearance: Normal appearance. He is well-groomed and normal weight.  HENT:     Right Ear: Tympanic membrane normal.     Left Ear: Tympanic membrane normal.  Eyes:     Extraocular Movements: Extraocular movements intact.     Conjunctiva/sclera: Conjunctivae normal.  Neck:     Thyroid: No thyromegaly.  Cardiovascular:     Rate and Rhythm: Normal rate and regular rhythm.     Heart sounds: S1 normal and S2 normal. No murmur heard. Pulmonary:     Effort: Pulmonary effort is normal.     Breath sounds: Normal breath sounds and air entry. No rales.  Abdominal:     General: Bowel sounds are normal.  Musculoskeletal:     Right lower leg: No edema.     Left lower leg: No edema.  Neurological:     General: No focal deficit present.     Mental Status: He is alert and oriented to person, place, and time.     Gait: Gait is intact.  Psychiatric:        Mood and Affect: Mood and affect normal.      No results found for any visits on 02/27/22.     Assessment & Plan:    Routine Health Maintenance and Physical Exam  Immunization History  Administered Date(s) Administered   Influenza Split 01/23/2015   Influenza, High Dose Seasonal PF 04/09/2016, 05/19/2017, 05/11/2018, 06/25/2019   Influenza,inj,Quad PF,6+ Mos 01/20/2018, 01/21/2020   Influenza-Unspecified 01/24/2017, 02/04/2019, 01/18/2021   PFIZER(Purple Top)SARS-COV-2 Vaccination 04/30/2019, 05/21/2019, 06/25/2019, 01/28/2020   PNEUMOCOCCAL CONJUGATE-20 02/26/2021   Tdap 12/06/2014    Health Maintenance  Topic Date Due   INFLUENZA VACCINE  02/27/2022 (Originally 11/13/2021)   COVID-19 Vaccine (5 - Pfizer series) 03/15/2022 (Originally 03/24/2020)   Hepatitis C Screening  08/07/2022 (Originally 06/08/2005)   TETANUS/TDAP  12/05/2024   HIV Screening  Completed   HPV VACCINES  Aged Out    Discussed health benefits of physical activity, and encouraged him to engage in regular exercise appropriate for his age and  condition.  Problem List Items Addressed This Visit       Other   Obesity (BMI 30-39.9) - Primary (Chronic)    We had a long discussion about increasing the dose of the Wegovy to enhance weight loss. Pt states he is willing to try the dosage increase and will use stool softeners/ fiber supplements/ probiotics OTC to help with the hard stools will also call in ondansetron 4 mg every 6 hour PRN nausea to help with the side effects. Patient will call me when he needs to stop the dosage increases. RTC 6 months       Relevant Medications   Semaglutide-Weight Management 0.5 MG/0.5ML SOAJ (  Start on 03/28/2022)   Semaglutide-Weight Management 1 MG/0.5ML SOAJ (Start on 04/26/2022)   Semaglutide-Weight Management 1.7 MG/0.75ML SOAJ (Start on 05/25/2022)   Semaglutide-Weight Management 2.4 MG/0.75ML SOAJ (Start on 06/23/2022)   Other Relevant Orders   TSH   Hemoglobin A1c   Other Visit Diagnoses     Nausea       Relevant Medications   Side effect of the Wegovy, see above discussion.  ondansetron (ZOFRAN-ODT) 4 MG disintegrating tablet   Well adult on routine health check       Relevant Orders   Otherwise normal physical exam findings. Patient is due for his annual bloodwork today.  CMP   Lipid Panel   CBC (no diff)      Return in about 6 months (around 08/28/2022) for Follow up weight check.     Farrel Conners, MD

## 2022-02-27 NOTE — Assessment & Plan Note (Signed)
We had a long discussion about increasing the dose of the [facility] to enhance weight loss. Pt states he is willing to try the dosage increase and will use stool softeners/ fiber supplements/ probiotics OTC to help with the hard stools will also call in ondansetron 4 mg every 6 hour PRN nausea to help with the side effects. Patient will call me when he needs to stop the dosage increases. RTC 6 months

## 2022-02-28 NOTE — Progress Notes (Signed)
Labs stable from previous, TG level is better. Continue current die tand exercise.

## 2022-03-05 ENCOUNTER — Encounter: Payer: Self-pay | Admitting: Family Medicine

## 2022-03-05 ENCOUNTER — Other Ambulatory Visit (HOSPITAL_COMMUNITY): Payer: Self-pay

## 2022-03-05 DIAGNOSIS — J3081 Allergic rhinitis due to animal (cat) (dog) hair and dander: Secondary | ICD-10-CM | POA: Diagnosis not present

## 2022-03-05 DIAGNOSIS — J3089 Other allergic rhinitis: Secondary | ICD-10-CM | POA: Diagnosis not present

## 2022-03-05 DIAGNOSIS — H66001 Acute suppurative otitis media without spontaneous rupture of ear drum, right ear: Secondary | ICD-10-CM

## 2022-03-05 DIAGNOSIS — J301 Allergic rhinitis due to pollen: Secondary | ICD-10-CM | POA: Diagnosis not present

## 2022-03-05 MED ORDER — AMOXICILLIN 875 MG PO TABS
875.0000 mg | ORAL_TABLET | Freq: Two times a day (BID) | ORAL | 0 refills | Status: AC
  Filled 2022-03-05: qty 14, 7d supply, fill #0

## 2022-03-06 NOTE — Telephone Encounter (Signed)
Vaccine information added as below.

## 2022-03-11 ENCOUNTER — Other Ambulatory Visit (HOSPITAL_COMMUNITY): Payer: Self-pay

## 2022-03-13 DIAGNOSIS — J3089 Other allergic rhinitis: Secondary | ICD-10-CM | POA: Diagnosis not present

## 2022-03-13 DIAGNOSIS — J3081 Allergic rhinitis due to animal (cat) (dog) hair and dander: Secondary | ICD-10-CM | POA: Diagnosis not present

## 2022-03-20 DIAGNOSIS — J3089 Other allergic rhinitis: Secondary | ICD-10-CM | POA: Diagnosis not present

## 2022-03-20 DIAGNOSIS — J3081 Allergic rhinitis due to animal (cat) (dog) hair and dander: Secondary | ICD-10-CM | POA: Diagnosis not present

## 2022-03-20 DIAGNOSIS — J301 Allergic rhinitis due to pollen: Secondary | ICD-10-CM | POA: Diagnosis not present

## 2022-03-27 DIAGNOSIS — H338 Other retinal detachments: Secondary | ICD-10-CM | POA: Diagnosis not present

## 2022-03-27 DIAGNOSIS — J301 Allergic rhinitis due to pollen: Secondary | ICD-10-CM | POA: Diagnosis not present

## 2022-03-27 DIAGNOSIS — J3089 Other allergic rhinitis: Secondary | ICD-10-CM | POA: Diagnosis not present

## 2022-03-27 DIAGNOSIS — H35411 Lattice degeneration of retina, right eye: Secondary | ICD-10-CM | POA: Diagnosis not present

## 2022-03-27 DIAGNOSIS — H35462 Secondary vitreoretinal degeneration, left eye: Secondary | ICD-10-CM | POA: Diagnosis not present

## 2022-03-27 DIAGNOSIS — H31093 Other chorioretinal scars, bilateral: Secondary | ICD-10-CM | POA: Diagnosis not present

## 2022-03-27 DIAGNOSIS — J3081 Allergic rhinitis due to animal (cat) (dog) hair and dander: Secondary | ICD-10-CM | POA: Diagnosis not present

## 2022-04-03 DIAGNOSIS — J301 Allergic rhinitis due to pollen: Secondary | ICD-10-CM | POA: Diagnosis not present

## 2022-04-03 DIAGNOSIS — J3081 Allergic rhinitis due to animal (cat) (dog) hair and dander: Secondary | ICD-10-CM | POA: Diagnosis not present

## 2022-04-03 DIAGNOSIS — J3089 Other allergic rhinitis: Secondary | ICD-10-CM | POA: Diagnosis not present

## 2022-04-09 DIAGNOSIS — D2262 Melanocytic nevi of left upper limb, including shoulder: Secondary | ICD-10-CM | POA: Diagnosis not present

## 2022-04-09 DIAGNOSIS — J301 Allergic rhinitis due to pollen: Secondary | ICD-10-CM | POA: Diagnosis not present

## 2022-04-09 DIAGNOSIS — D2271 Melanocytic nevi of right lower limb, including hip: Secondary | ICD-10-CM | POA: Diagnosis not present

## 2022-04-09 DIAGNOSIS — J3089 Other allergic rhinitis: Secondary | ICD-10-CM | POA: Diagnosis not present

## 2022-04-09 DIAGNOSIS — D225 Melanocytic nevi of trunk: Secondary | ICD-10-CM | POA: Diagnosis not present

## 2022-04-09 DIAGNOSIS — D1801 Hemangioma of skin and subcutaneous tissue: Secondary | ICD-10-CM | POA: Diagnosis not present

## 2022-04-09 DIAGNOSIS — L814 Other melanin hyperpigmentation: Secondary | ICD-10-CM | POA: Diagnosis not present

## 2022-04-09 DIAGNOSIS — J3081 Allergic rhinitis due to animal (cat) (dog) hair and dander: Secondary | ICD-10-CM | POA: Diagnosis not present

## 2022-04-09 DIAGNOSIS — D2261 Melanocytic nevi of right upper limb, including shoulder: Secondary | ICD-10-CM | POA: Diagnosis not present

## 2022-04-11 DIAGNOSIS — J3089 Other allergic rhinitis: Secondary | ICD-10-CM | POA: Diagnosis not present

## 2022-04-11 DIAGNOSIS — J3081 Allergic rhinitis due to animal (cat) (dog) hair and dander: Secondary | ICD-10-CM | POA: Diagnosis not present

## 2022-04-11 DIAGNOSIS — J301 Allergic rhinitis due to pollen: Secondary | ICD-10-CM | POA: Diagnosis not present

## 2022-04-12 ENCOUNTER — Encounter: Payer: Self-pay | Admitting: Family Medicine

## 2022-04-12 ENCOUNTER — Other Ambulatory Visit (HOSPITAL_COMMUNITY): Payer: Self-pay

## 2022-04-12 ENCOUNTER — Telehealth: Payer: Self-pay

## 2022-04-12 DIAGNOSIS — J3081 Allergic rhinitis due to animal (cat) (dog) hair and dander: Secondary | ICD-10-CM | POA: Diagnosis not present

## 2022-04-12 NOTE — Telephone Encounter (Signed)
Pharmacy Patient Advocate Encounter   Received notification from [facility] that prior authorization for [facility] '1mg'$ /0.53m is required/requested.  Per Test Claim: prior aJosem Kaufmannis required   PA submitted on 04/12/22 to (ins) [employer] via CoverMyMeds Key  [id number] Status is pending

## 2022-04-16 DIAGNOSIS — J3081 Allergic rhinitis due to animal (cat) (dog) hair and dander: Secondary | ICD-10-CM | POA: Diagnosis not present

## 2022-04-16 DIAGNOSIS — J3089 Other allergic rhinitis: Secondary | ICD-10-CM | POA: Diagnosis not present

## 2022-04-18 ENCOUNTER — Other Ambulatory Visit (HOSPITAL_COMMUNITY): Payer: Self-pay

## 2022-04-18 ENCOUNTER — Telehealth: Payer: Self-pay | Admitting: Family Medicine

## 2022-04-18 NOTE — Telephone Encounter (Signed)
Patient checking on progress of PA for Semaglutide-Weight Management 1 MG/0.5ML [place] Phone: [phone]  Fax: [phone]

## 2022-04-18 NOTE — Telephone Encounter (Signed)
He wasn't even up to the full dose-- I can't believe they are denying it--- can I do a verbal appeal? Is there a number I can call?

## 2022-04-18 NOTE — Telephone Encounter (Signed)
It was denied. I'm trying to see if I can do a verbal appeal with the insurance company

## 2022-04-18 NOTE — Telephone Encounter (Signed)
Prior Josem Kaufmann has been denied and documented in separate encounter. Please advise.

## 2022-04-18 NOTE — Telephone Encounter (Signed)
Pharmacy Patient Advocate Encounter  Received notification from [employer] that the request for prior authorization for Wegovy '1mg'$ / 0.31m has been denied due to .    How would you like to proceed?  Please be advised appeals may take up to 5 business days to be submitted as pharmacist prepares necessary documentation.  Thank you!

## 2022-04-19 ENCOUNTER — Telehealth: Payer: Commercial Managed Care - PPO | Admitting: Nurse Practitioner

## 2022-04-19 DIAGNOSIS — S30861A Insect bite (nonvenomous) of abdominal wall, initial encounter: Secondary | ICD-10-CM | POA: Diagnosis not present

## 2022-04-19 DIAGNOSIS — W57XXXA Bitten or stung by nonvenomous insect and other nonvenomous arthropods, initial encounter: Secondary | ICD-10-CM

## 2022-04-19 MED ORDER — DOXYCYCLINE HYCLATE 100 MG PO TABS: 100.0000 mg | ORAL_TABLET | Freq: Two times a day (BID) | ORAL | 0 refills | Status: AC

## 2022-04-19 NOTE — Progress Notes (Signed)
E-Visit for Tick Bite   Thank you for describing your tick bite, Here is how we plan to help! Based on the information that you shared with me it looks like you have An infected or complicated tick bite that requires a longer course of antibiotics and will need for you to schedule a follow-up visit with a provider.   In most cases a tick bite is painless and does not itch.  Most tick bites in which the tick is quickly removed do not require prescriptions. Ticks can transmit several diseases if they are infected and remain attacked to your skin. Therefore the length that the tick was attached and any symptoms you have experienced after the bite are import to accurately develop your custom treatment plan. In most cases a single dose of doxycycline may prevent the development of a more serious condition.   Based on your information I have Provided a home care guide for tick bites and  instructions on when to call for help. and Your symptoms indicate that you need a longer course of antibiotics and a follow up visit with a provider. I have sent doxycycline 100 mg twice a day for 21 days to the pharmacy that you selected. You will need to schedule a follow up visit with your provider. If you do not have a primary care provider you may use our telehealth physicians on the web at [place]  are associated with illness?   The Wood Tick (dog tick) is the size of a watermelon seed and can sometimes transmit [facility] spotted fever and [place] tick fever.    The Deer Tick (black-legged tick) is between the size of a poppy seed (pin head) and an apple seed, and can sometimes transmit Lyme disease.   A brown to black tick with a white splotch on its back is likely a male Amblyomma americanum ([place] tick). This tick has been associated with Southern Tick Associated illness ( STARI)   Lyme disease has become the most common tick-borne illness in the [place]. The risk of  Lyme disease following a recognized deer tick bite is estimated to be 1%.  The majority of cases of Lyme disease start with a bull's eye rash at the site of the tick bite. The rash can occur days to weeks (typically 7-10 days) after a tick bite. Treatment with antibiotics is indicated if this rash appears. Flu-like symptoms may accompany the rash, including: fever, chills, headaches, muscle aches, and fatigue. Removing ticks promptly may prevent tick borne disease.   What can be used to prevent Tick Bites?   Insect repellant with at leas 20% DEET. Wearing long pants with sock and shoes. Avoiding tall grass and heavily wooded areas. Checking your skin after being outdoors. Shower with a washcloth after outdoor exposures.   HOME CARE ADVICE FOR TICK BITE   Wood Tick Removal:  Use a pair of tweezers and grasp the wood tick close to the skin (on its head). Pull the wood tick straight upward without twisting or crushing it. Maintain a steady pressure until it releases its grip.   If tweezers aren't available, use fingers, a loop of thread around the jaws, or a needle between the jaws for traction.  Note: covering the tick with petroleum jelly, nail polish or rubbing alcohol doesn't work. Neither does touching the tick with a hot or cold object. Tiny Deer Tick Removal:   Needs to be scraped off with a knife blade or  credit card edge. Place tick in a sealed container (e.g. glass jar, zip lock plastic bag), in case your doctor wants to see it. Tick's Head Removal:  If the wood tick's head breaks off in the skin, it must be removed. Clean the skin. Then use a sterile needle to uncover the head and lift it out or scrape it off.  If a very small piece of the head remains, the skin will eventually slough it off. Antibiotic Ointment:  Wash the wound and your hands with soap and water after removal to prevent catching any tick disease.  Apply an over the counter antibiotic ointment (e.g. bacitracin) to  the bite once. Expected Course: Tick bites normally don't itch or hurt. That's why they often go unnoticed. Call Your Doctor If:  You can't remove the tick or the tick's head Fever, a severe head ache, or rash occur in the next 2 weeks Bite begins to look infected Lyme's disease is common in your area You have not had a tetanus in the last 10 years Your current symptoms become worse      MAKE SURE YOU  Understand these instructions. Will watch your condition. Will get help right away if you are not doing well or get worse.       Thank you for choosing an e-visit.   Your e-visit answers were reviewed by a board certified advanced clinical practitioner to complete your personal care plan. Depending upon the condition, your plan could have included both over the counter or prescription medications.   Please review your pharmacy choice. Make sure the pharmacy is open so you can pick up prescription now. If there is a problem, you may contact your provider through [employer] and have the prescription routed to another pharmacy.  Your safety is important to [place]. If you have drug allergies check your prescription carefully.    For the next 24 hours you can use MyChart to ask questions about today's visit, request a non-urgent call back, or ask for a work or school excuse. You will get an email in the next two days asking about your experience. I hope that your e-visit has been valuable and will speed your recovery.      Meds ordered this encounter  Medications   doxycycline (VIBRA-TABS) 100 MG tablet    Sig: Take 1 tablet (100 mg total) by mouth 2 (two) times daily for 21 days.    Dispense:  42 tablet    Refill:  0     I spent approximately 5 minutes reviewing the patient's history, current symptoms and coordinating their care today.

## 2022-04-22 ENCOUNTER — Other Ambulatory Visit (HOSPITAL_COMMUNITY): Payer: Self-pay

## 2022-04-22 NOTE — Telephone Encounter (Signed)
There is an appeal option available. I will contact our current appeals team to begin process

## 2022-04-23 ENCOUNTER — Encounter: Payer: Self-pay | Admitting: Family Medicine

## 2022-04-23 ENCOUNTER — Telehealth (INDEPENDENT_AMBULATORY_CARE_PROVIDER_SITE_OTHER): Payer: Commercial Managed Care - PPO | Admitting: Family Medicine

## 2022-04-23 ENCOUNTER — Other Ambulatory Visit (HOSPITAL_COMMUNITY): Payer: Self-pay

## 2022-04-23 VITALS — Wt 284.0 lb

## 2022-04-23 DIAGNOSIS — E669 Obesity, unspecified: Secondary | ICD-10-CM

## 2022-04-23 MED ORDER — ZEPBOUND 2.5 MG/0.5ML ~~LOC~~ SOAJ
2.5000 mg | SUBCUTANEOUS | 0 refills | Status: DC
  Filled 2022-04-23: qty 2, 28d supply, fill #0

## 2022-04-23 NOTE — Progress Notes (Signed)
Established Patient Office Visit  Subjective   Patient ID: Sean Allen, male    DOB: May 31, 1985  Age: 37 y.o. MRN: [id number]  Chief Complaint  Patient presents with   Obesity    Patient states Sean Allen was denied by the insurance and patient requested to discuss options   I connected with  [name] on 04/23/22 by a video enabled telemedicine application and verified that I am speaking with the correct person using two identifiers.   I discussed the limitations of evaluation and management by telemedicine. The patient expressed understanding and agreed to proceed.   Patient location: home address  Provider location: [facility] Patient is here to discuss the denial on the [facility] medication. He reports that it was working well for him, however [employer] will no longer reimburse for the medication, because "he did not lose at least 5% of his body weight". I explained to the patient that we will try to go through an appeals process, however it may not be successful. We discussed other options for medical weight loss therapy including Zepbound and phentermine. I suggested that since his insurance has changed carriers we might try for the Zepbound first. If this is denied then we will use phentermine tablets. Pt is agreeable to this plan. Pt reports this morning he weighed 284, which is a 8 pound weight loss since the last visit. He states that he is adhering to his dietary plan that we had previously discussed.   Patient saw me in August,  November and December for his physician supervised weight loss program, and he is also participating in his insurance's wellness program for weight loss. Has been doing this for the past 2 years and speaks with them monthly to help achieve his goals.       ROS    Objective:     Wt 284 lb (128.8 kg)   BMI 36.46 kg/m    Physical Exam Vitals reviewed.  Constitutional:      Appearance: Normal appearance. He is obese.   Pulmonary:     Effort: Pulmonary effort is normal.  Neurological:     Mental Status: He is alert and oriented to person, place, and time. Mental status is at baseline.  Psychiatric:        Mood and Affect: Mood normal.        Behavior: Behavior normal.      No results found for any visits on 04/23/22.    The ASCVD Risk score (Arnett DK, et al., 2019) failed to calculate for the following reasons:   The 2019 ASCVD risk score is only valid for ages 44 to 65    Assessment & Plan:   Problem List Items Addressed This Visit       Unprioritized   Obesity (BMI 30-39.9) - Primary (Chronic)   Relevant Medications   tirzepatide (ZEPBOUND) 2.5 MG/0.5ML Pen   Patient has been engaging in a physician supervised weight loss program for the past several months (please reference my notes from August, November and December of 2023.) he has remained adherent to his diet plan and has lost a total of 13 pounds since August 2023. I will try again to get him Zepbound 2.5 mg once weekly since he has been adhering his the weight loss program. If we are not successful in getting him the medication then I will call in phentermine for him to use instead. Return in about 3 months (around 07/23/2022) for for weight  loss visit..   I spent 20 minutes in this face to face, video enabled encounter with the patient today.  Farrel Conners, MD

## 2022-04-24 DIAGNOSIS — J301 Allergic rhinitis due to pollen: Secondary | ICD-10-CM | POA: Diagnosis not present

## 2022-04-24 DIAGNOSIS — J3081 Allergic rhinitis due to animal (cat) (dog) hair and dander: Secondary | ICD-10-CM | POA: Diagnosis not present

## 2022-04-24 DIAGNOSIS — J3089 Other allergic rhinitis: Secondary | ICD-10-CM | POA: Diagnosis not present

## 2022-04-25 ENCOUNTER — Other Ambulatory Visit (HOSPITAL_COMMUNITY): Payer: Self-pay

## 2022-04-25 NOTE — Telephone Encounter (Signed)
You may call for appeal if you'd prefer. [phone]. Since you know the pt and their background, along with your clinical expertise, you will likely be able to appeal better than I can. There is also [phone] if you'd like to discuss this case with a clinical reviewer.  If you'd prefer to fax, the # is [phone], just include the pt's name, member # [id number] (this should be the # they have, but if they can't find it, try this one [id number]. [employer] is still the pharmacy benefit administrator, but our ID #s changed at the first of the year) the PA ref # [id number]

## 2022-04-29 DIAGNOSIS — J3089 Other allergic rhinitis: Secondary | ICD-10-CM | POA: Diagnosis not present

## 2022-04-29 DIAGNOSIS — J3081 Allergic rhinitis due to animal (cat) (dog) hair and dander: Secondary | ICD-10-CM | POA: Diagnosis not present

## 2022-04-29 DIAGNOSIS — J301 Allergic rhinitis due to pollen: Secondary | ICD-10-CM | POA: Diagnosis not present

## 2022-04-30 ENCOUNTER — Other Ambulatory Visit (HOSPITAL_COMMUNITY): Payer: Self-pay

## 2022-05-03 ENCOUNTER — Other Ambulatory Visit (HOSPITAL_COMMUNITY): Payer: Self-pay

## 2022-05-03 ENCOUNTER — Encounter (HOSPITAL_BASED_OUTPATIENT_CLINIC_OR_DEPARTMENT_OTHER): Payer: Self-pay | Admitting: Pulmonary Disease

## 2022-05-03 ENCOUNTER — Ambulatory Visit (INDEPENDENT_AMBULATORY_CARE_PROVIDER_SITE_OTHER): Payer: Commercial Managed Care - PPO | Admitting: Pulmonary Disease

## 2022-05-03 VITALS — BP 124/66 | HR 74 | Temp 99.5°F | Ht 75.0 in | Wt 293.8 lb

## 2022-05-03 DIAGNOSIS — G4733 Obstructive sleep apnea (adult) (pediatric): Secondary | ICD-10-CM | POA: Diagnosis not present

## 2022-05-03 DIAGNOSIS — G4730 Sleep apnea, unspecified: Secondary | ICD-10-CM | POA: Diagnosis not present

## 2022-05-03 DIAGNOSIS — E669 Obesity, unspecified: Secondary | ICD-10-CM

## 2022-05-03 NOTE — Patient Instructions (Signed)
Follow up in 1 year.

## 2022-05-03 NOTE — Progress Notes (Signed)
[facility]  Chief Complaint  Patient presents with   Follow-up    Pt states no new issues since LOV.    Constitutional:  BP 124/66 (BP Location: Right Arm, Patient Position: Sitting, Cuff Size: Large)   Pulse 74   Temp 99.5 F (37.5 C) (Oral)   Ht '6\' 3"'$  (1.905 m)   Wt 293 lb 12.8 oz (133.3 kg)   SpO2 97%   BMI 36.72 kg/m   Past Medical History:  Varicocele, Retinal detachment, GERD, Asthma, Allergic rhinitis  Past Surgical History:  He  has a past surgical history that includes Tonsillectomy and adenoidectomy; Eye surgery; and Testicle surgery (Right).  Brief Summary:  Sean Allen is a 37 y.o. male with obstructive sleep apnea.      Subjective:   Uses CPAP nightly.  No issues with mask fit.  Has full face mask.  Not having sinus congestion or dry mouth.  Feels rested.  Uses old CPAP when he travels.  Physical Exam:   Appearance - well kempt   ENMT - no sinus tenderness, no oral exudate, no LAN, Mallampati 3 airway, no stridor  Respiratory - equal breath sounds bilaterally, no wheezing or rales  CV - s1s2 regular rate and rhythm, no murmurs  Ext - no clubbing, no edema  Skin - no rashes  Psych - normal mood and affect     Sleep Tests:  HST 02/10/15 >> AHI 44.4, SaO2 low 76%. Auto CPAP 04/03/22 to 05/02/22 >> used on 28 out of 30 nights with average 7 hrs 15 min.  Average AHI 1.3 with median CPAP 7 and 95 th percentile CPAP 11 cm H2O  Social History:  He  reports that he has never smoked. He has never used smokeless tobacco. He reports current alcohol use. He reports that he does not use drugs.  Family History:  His family history includes Arthritis in his mother; Bradycardia in his mother; Diabetes in his father, maternal grandfather, and paternal aunt; Healthy in his sister; Heart attack in his paternal grandfather and paternal grandmother; Heart disease in his maternal grandfather, maternal grandmother,  paternal grandfather, and paternal grandmother; High Cholesterol in his paternal grandmother; Hyperlipidemia in his father; Hypertension in his paternal grandmother; Kidney failure in his maternal grandfather and maternal grandmother; Lung disease in his father; Lymphoma in his maternal grandmother; Other in his father; Prostate cancer in his paternal great-grandfather; Prostate cancer (age of onset: 101) in his paternal grandfather; Sleep apnea in his father.     Assessment/Plan:   Obstructive sleep apnea.   - he is compliant with CPAP and reports benefit from therapy - he uses [employer] for his DME - current CPAP ordered July 2022 - continue auto CPAP 5 to 15 cm H2O  Obesity. - he exercises on regular basis - discussed setting achievable goals regarding weight loss and celebrating achievements even if they are in small increments  Time Spent Involved in Patient Care on Day of Examination:  15 minutes  Follow up:   Patient Instructions  Follow up in 1 year  Medication List:   Allergies as of 05/03/2022       Reactions   Ciprofloxacin    GI upset   Dust Mite Extract    Grass Extracts [gramineae Pollens]    Mold Extract [trichophyton]    Other    Cats         Medication List        Accurate as of  May 03, 2022  2:21 PM. If you have any questions, ask your nurse or doctor.          albuterol 108 (90 Base) MCG/ACT inhaler Commonly known as: VENTOLIN HFA Inhale 1-2 puffs into the lungs every 4-6 hours as needed for cough/wheezing   cetirizine 10 MG tablet Commonly known as: ZyrTEC Allergy Take 1 tablet (10 mg total) by mouth daily. What changed:  when to take this reasons to take this   doxycycline 100 MG tablet Commonly known as: VIBRA-TABS Take 1 tablet (100 mg total) by mouth 2 (two) times daily for 21 days.   EPINEPHrine 0.3 mg/0.3 mL Soaj injection Commonly known as: EpiPen 2-Pak Use as directed. Inject as needed.   FISH OIL PO Take by mouth  daily.   meloxicam 15 MG tablet Commonly known as: MOBIC Take 1 tablet by mouth daily as needed for pain.   MULTIVITAMIN ADULT PO Take by mouth.   ondansetron 4 MG disintegrating tablet Commonly known as: ZOFRAN-ODT Dissolve 1 tablet (4 mg total) by mouth every 8 (eight) hours as needed for nausea or vomiting (for nausea from wegovy or other source).   pantoprazole 20 MG tablet Commonly known as: PROTONIX TAKE 1 TABLET BY MOUTH ONCE A DAY AS NEEDED   PRESCRIPTION MEDICATION Allergy injections by Dr Donneta Romberg   vitamin C 1000 MG tablet Take 2,000 mg by mouth daily.   Zepbound 2.5 MG/0.5ML Pen Generic drug: tirzepatide Inject 2.5 mg into the skin once a week.        Signature:  Chesley Mires, MD [place] Pager - [phone] 05/03/2022, 2:21 PM

## 2022-05-05 DIAGNOSIS — G4733 Obstructive sleep apnea (adult) (pediatric): Secondary | ICD-10-CM | POA: Diagnosis not present

## 2022-05-06 ENCOUNTER — Telehealth: Payer: Self-pay

## 2022-05-06 ENCOUNTER — Other Ambulatory Visit (HOSPITAL_COMMUNITY): Payer: Self-pay

## 2022-05-06 ENCOUNTER — Other Ambulatory Visit: Payer: Self-pay | Admitting: Family

## 2022-05-06 DIAGNOSIS — K219 Gastro-esophageal reflux disease without esophagitis: Secondary | ICD-10-CM

## 2022-05-06 NOTE — Telephone Encounter (Signed)
Pharmacy Patient Advocate Encounter   Received notification from [facility] that prior authorization for Zepbound 2.'5MG'$ /0.5ML pen-injectors is required/requested.  Per Test Claim: Prior authorization required   PA submitted on 05/06/22 to (ins) [employer] via CoverMyMeds [employer] [facility] Status is pending

## 2022-05-07 DIAGNOSIS — J3089 Other allergic rhinitis: Secondary | ICD-10-CM | POA: Diagnosis not present

## 2022-05-07 DIAGNOSIS — J301 Allergic rhinitis due to pollen: Secondary | ICD-10-CM | POA: Diagnosis not present

## 2022-05-07 DIAGNOSIS — J3081 Allergic rhinitis due to animal (cat) (dog) hair and dander: Secondary | ICD-10-CM | POA: Diagnosis not present

## 2022-05-08 ENCOUNTER — Other Ambulatory Visit: Payer: Self-pay

## 2022-05-08 ENCOUNTER — Encounter: Payer: Self-pay | Admitting: Family Medicine

## 2022-05-08 ENCOUNTER — Other Ambulatory Visit (HOSPITAL_COMMUNITY): Payer: Self-pay

## 2022-05-08 DIAGNOSIS — E669 Obesity, unspecified: Secondary | ICD-10-CM

## 2022-05-08 DIAGNOSIS — K219 Gastro-esophageal reflux disease without esophagitis: Secondary | ICD-10-CM

## 2022-05-08 MED ORDER — PANTOPRAZOLE SODIUM 20 MG PO TBEC
20.0000 mg | DELAYED_RELEASE_TABLET | Freq: Every day | ORAL | 1 refills | Status: DC | PRN
  Filled 2022-05-08: qty 90, 90d supply, fill #0
  Filled 2022-07-19: qty 90, 90d supply, fill #1

## 2022-05-08 MED ORDER — ZEPBOUND 7.5 MG/0.5ML ~~LOC~~ SOAJ
7.5000 mg | SUBCUTANEOUS | 0 refills | Status: DC
  Filled 2022-05-08: qty 2, 28d supply, fill #0

## 2022-05-08 MED ORDER — ZEPBOUND 5 MG/0.5ML ~~LOC~~ SOAJ
5.0000 mg | SUBCUTANEOUS | 0 refills | Status: DC
  Filled 2022-05-08 – 2022-05-31 (×2): qty 2, 28d supply, fill #0

## 2022-05-08 MED ORDER — ZEPBOUND 10 MG/0.5ML ~~LOC~~ SOAJ
10.0000 mg | SUBCUTANEOUS | 2 refills | Status: DC
  Filled 2022-05-08: qty 2, 28d supply, fill #0

## 2022-05-08 NOTE — Telephone Encounter (Signed)
Ok to refill the protonix, I will have to work on getting his appeal done

## 2022-05-08 NOTE — Addendum Note (Signed)
Addended by: Farrel Conners on: 05/08/2022 11:39 AM   Modules accepted: Orders

## 2022-05-08 NOTE — Telephone Encounter (Signed)
Patient Advocate Encounter  Prior Authorization for Zepbound 2.'5MG'$ /0.5ML pen-injectors has been approved.    PA# [id number] Effective dates: 05/07/22 through 11/03/22

## 2022-05-15 DIAGNOSIS — J301 Allergic rhinitis due to pollen: Secondary | ICD-10-CM | POA: Diagnosis not present

## 2022-05-15 DIAGNOSIS — J3081 Allergic rhinitis due to animal (cat) (dog) hair and dander: Secondary | ICD-10-CM | POA: Diagnosis not present

## 2022-05-15 DIAGNOSIS — J3089 Other allergic rhinitis: Secondary | ICD-10-CM | POA: Diagnosis not present

## 2022-05-20 DIAGNOSIS — J301 Allergic rhinitis due to pollen: Secondary | ICD-10-CM | POA: Diagnosis not present

## 2022-05-20 DIAGNOSIS — J3081 Allergic rhinitis due to animal (cat) (dog) hair and dander: Secondary | ICD-10-CM | POA: Diagnosis not present

## 2022-05-20 DIAGNOSIS — J3089 Other allergic rhinitis: Secondary | ICD-10-CM | POA: Diagnosis not present

## 2022-05-28 DIAGNOSIS — J301 Allergic rhinitis due to pollen: Secondary | ICD-10-CM | POA: Diagnosis not present

## 2022-05-28 DIAGNOSIS — J3089 Other allergic rhinitis: Secondary | ICD-10-CM | POA: Diagnosis not present

## 2022-05-28 DIAGNOSIS — J3081 Allergic rhinitis due to animal (cat) (dog) hair and dander: Secondary | ICD-10-CM | POA: Diagnosis not present

## 2022-05-31 ENCOUNTER — Other Ambulatory Visit (HOSPITAL_COMMUNITY): Payer: Self-pay

## 2022-05-31 ENCOUNTER — Other Ambulatory Visit: Payer: Self-pay

## 2022-06-05 DIAGNOSIS — G4733 Obstructive sleep apnea (adult) (pediatric): Secondary | ICD-10-CM | POA: Diagnosis not present

## 2022-06-05 DIAGNOSIS — J3089 Other allergic rhinitis: Secondary | ICD-10-CM | POA: Diagnosis not present

## 2022-06-05 DIAGNOSIS — J301 Allergic rhinitis due to pollen: Secondary | ICD-10-CM | POA: Diagnosis not present

## 2022-06-05 DIAGNOSIS — J3081 Allergic rhinitis due to animal (cat) (dog) hair and dander: Secondary | ICD-10-CM | POA: Diagnosis not present

## 2022-06-10 DIAGNOSIS — J3089 Other allergic rhinitis: Secondary | ICD-10-CM | POA: Diagnosis not present

## 2022-06-10 DIAGNOSIS — J3081 Allergic rhinitis due to animal (cat) (dog) hair and dander: Secondary | ICD-10-CM | POA: Diagnosis not present

## 2022-06-10 DIAGNOSIS — J301 Allergic rhinitis due to pollen: Secondary | ICD-10-CM | POA: Diagnosis not present

## 2022-06-12 ENCOUNTER — Other Ambulatory Visit (HOSPITAL_COMMUNITY): Payer: Self-pay

## 2022-06-12 ENCOUNTER — Other Ambulatory Visit: Payer: Self-pay

## 2022-06-12 ENCOUNTER — Encounter: Payer: Self-pay | Admitting: Family Medicine

## 2022-06-12 DIAGNOSIS — E669 Obesity, unspecified: Secondary | ICD-10-CM

## 2022-06-12 MED ORDER — CETIRIZINE HCL 10 MG PO TABS
10.0000 mg | ORAL_TABLET | Freq: Every day | ORAL | 1 refills | Status: DC
  Filled 2022-06-13: qty 30, 30d supply, fill #0
  Filled 2022-06-15: qty 60, 60d supply, fill #0

## 2022-06-13 ENCOUNTER — Other Ambulatory Visit: Payer: Self-pay

## 2022-06-13 ENCOUNTER — Other Ambulatory Visit (HOSPITAL_COMMUNITY): Payer: Self-pay

## 2022-06-13 IMAGING — DX DG ANKLE COMPLETE 3+V*L*
3 series · 3 of 3 positions shown · non-contrast
Comparison: None.

CLINICAL DATA: Left-sided ankle pain

EXAM:
LEFT ANKLE COMPLETE - 3+ VIEW

[ankle ap]
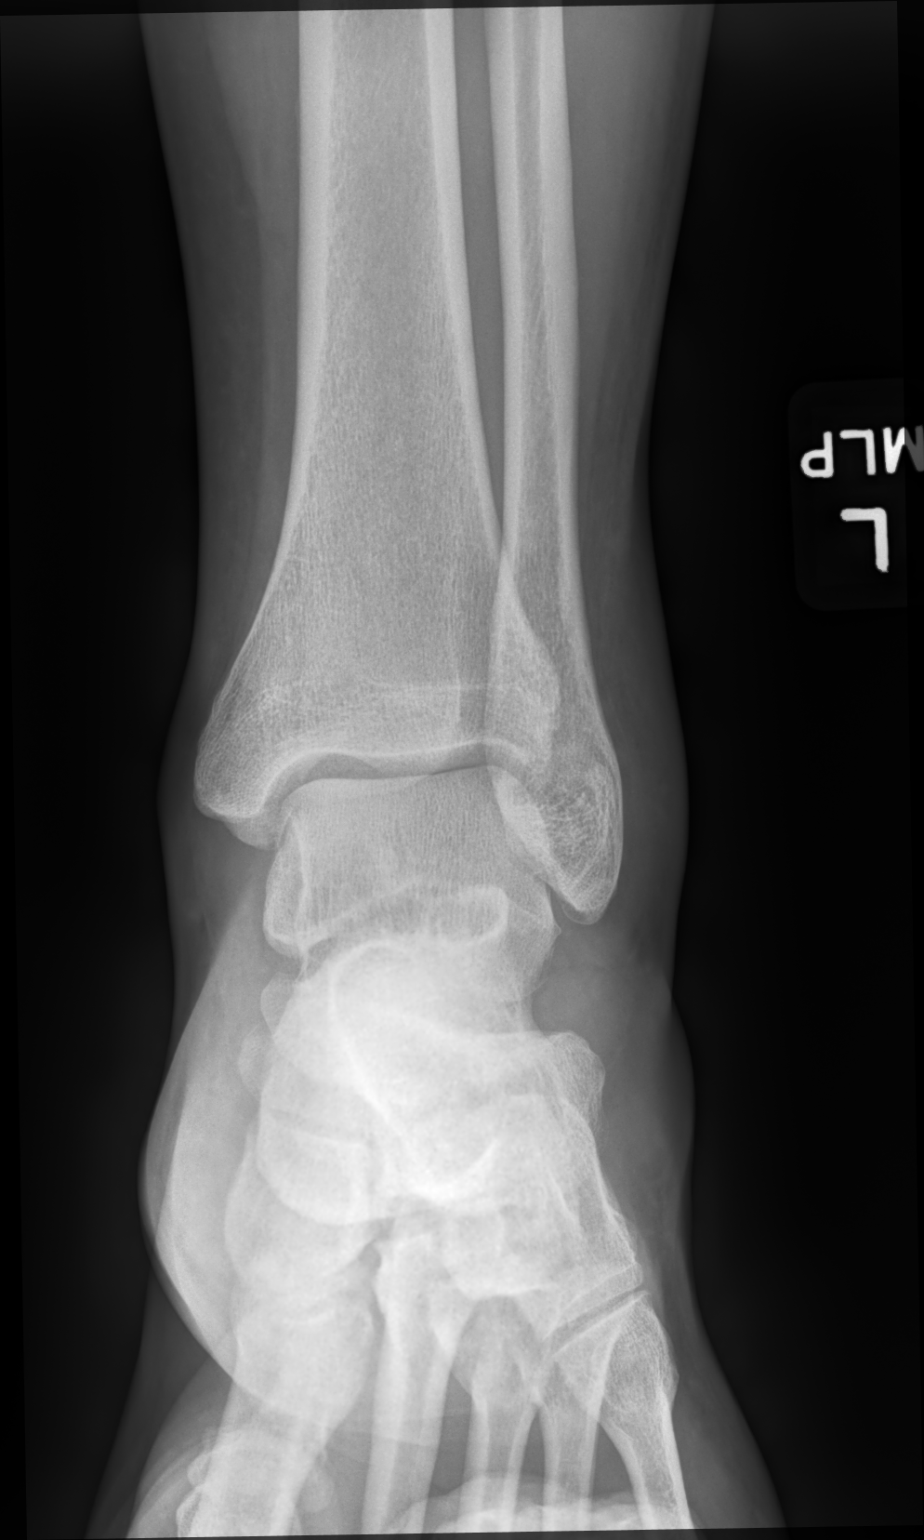

[ankle mlo]
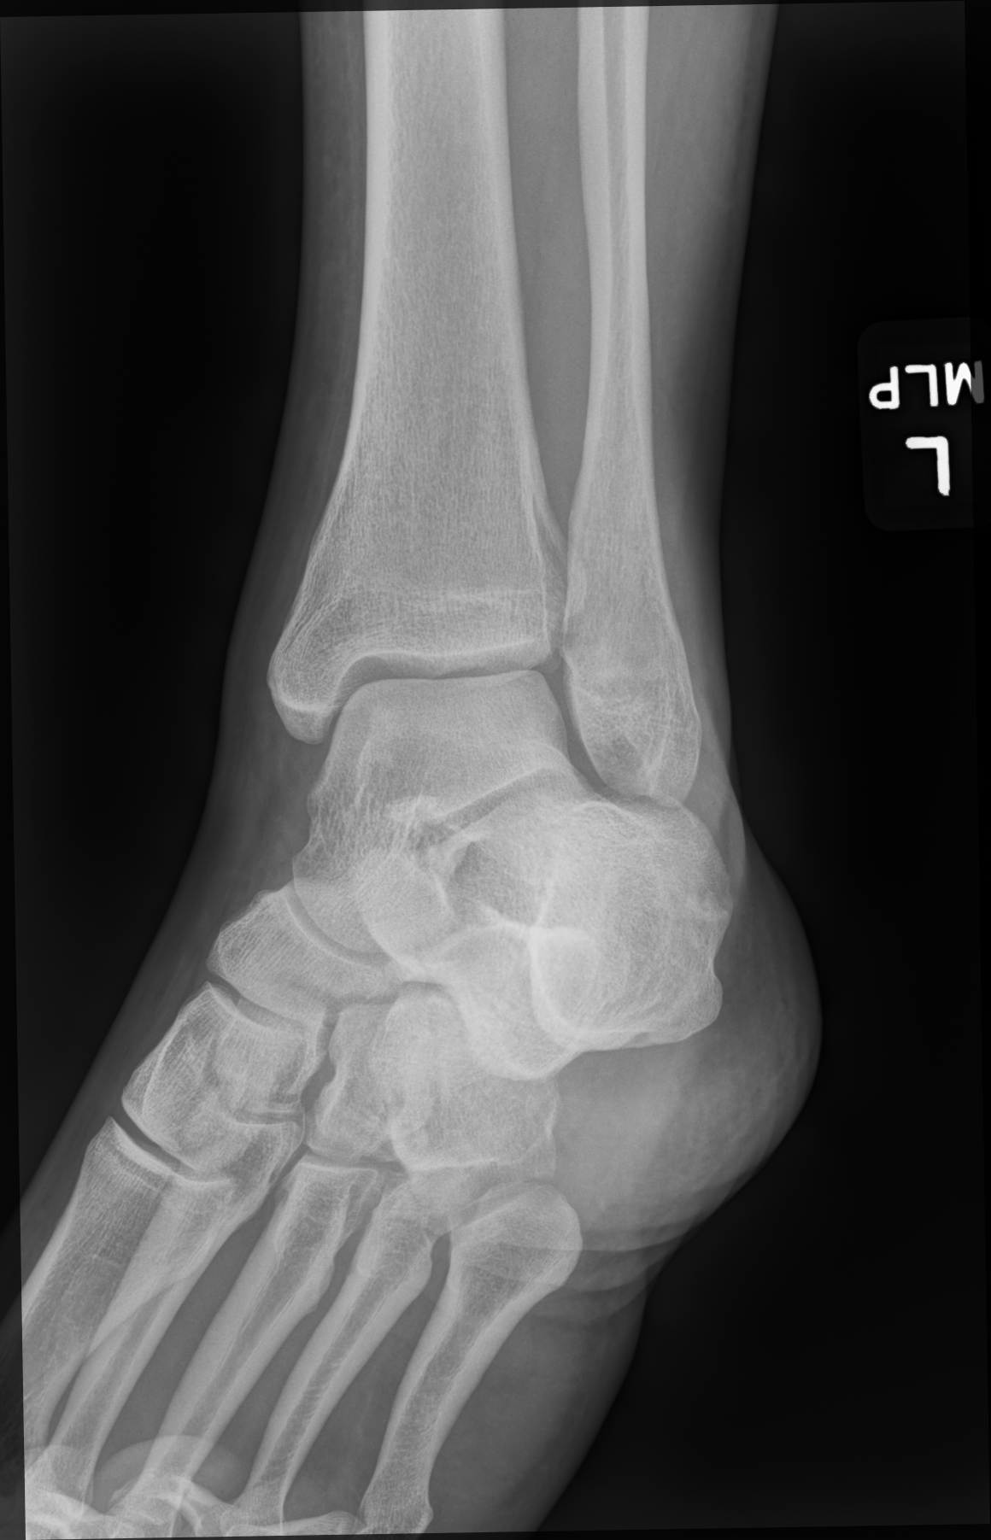

[ankle lat]
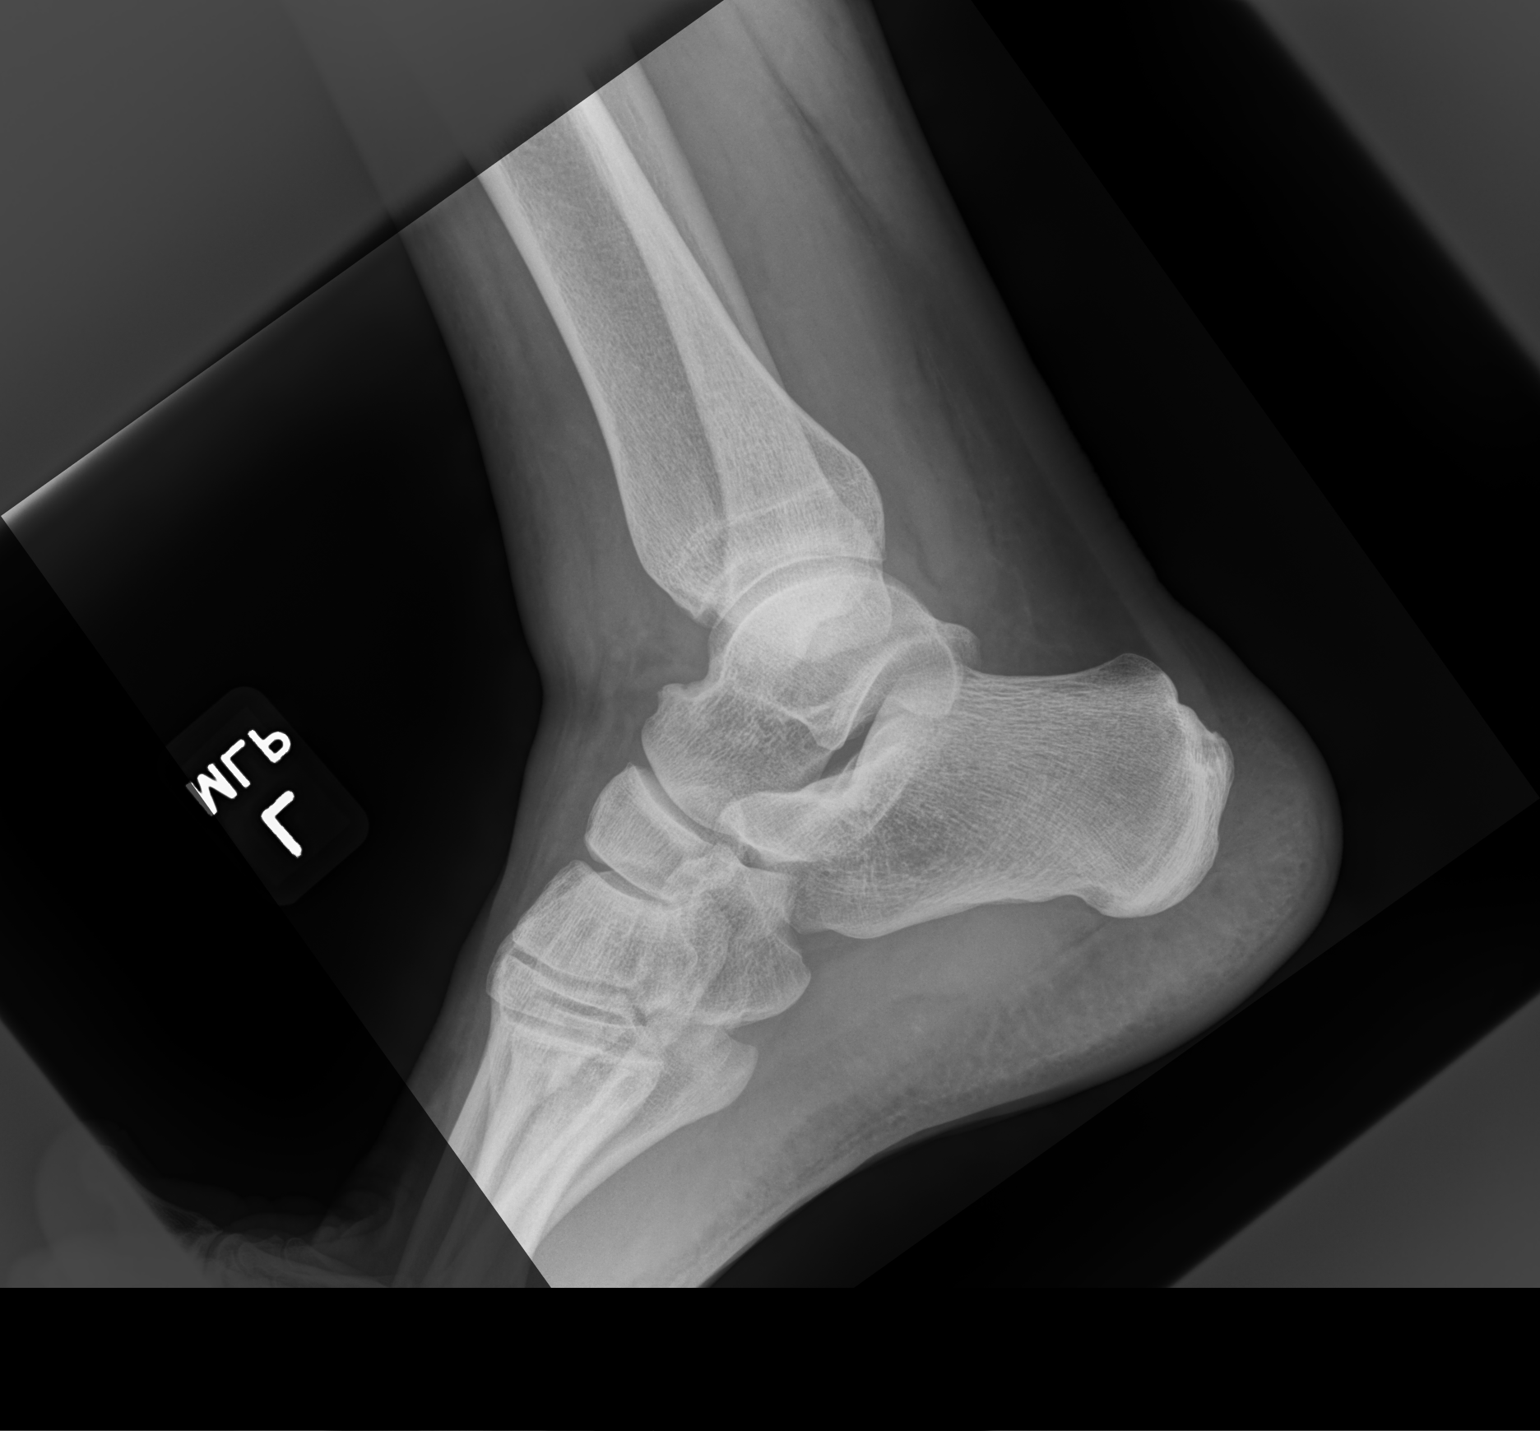

[3 of 3 positions shown; findings below may reference images not displayed]

FINDINGS: There is no evidence of fracture, dislocation, or joint effusion.
There is no evidence of arthropathy or other focal bone abnormality.
Soft tissues are unremarkable.
IMPRESSION: Negative.

## 2022-06-13 MED ORDER — ZEPBOUND 7.5 MG/0.5ML ~~LOC~~ SOAJ
7.5000 mg | SUBCUTANEOUS | 0 refills | Status: DC
  Filled 2022-06-13: qty 2, 28d supply, fill #0
  Filled 2022-06-14 – 2022-07-06 (×3): qty 2, 28d supply, fill #1
  Filled 2022-07-19 – 2022-08-08 (×5): qty 2, 28d supply, fill #2

## 2022-06-14 ENCOUNTER — Other Ambulatory Visit (HOSPITAL_COMMUNITY): Payer: Self-pay

## 2022-06-14 ENCOUNTER — Encounter (HOSPITAL_COMMUNITY): Payer: Self-pay

## 2022-06-15 ENCOUNTER — Other Ambulatory Visit (HOSPITAL_COMMUNITY): Payer: Self-pay

## 2022-06-17 ENCOUNTER — Other Ambulatory Visit: Payer: Self-pay

## 2022-06-17 DIAGNOSIS — J3089 Other allergic rhinitis: Secondary | ICD-10-CM | POA: Diagnosis not present

## 2022-06-17 DIAGNOSIS — J3081 Allergic rhinitis due to animal (cat) (dog) hair and dander: Secondary | ICD-10-CM | POA: Diagnosis not present

## 2022-06-17 DIAGNOSIS — J301 Allergic rhinitis due to pollen: Secondary | ICD-10-CM | POA: Diagnosis not present

## 2022-06-25 ENCOUNTER — Other Ambulatory Visit (HOSPITAL_COMMUNITY): Payer: Self-pay

## 2022-06-26 DIAGNOSIS — J3081 Allergic rhinitis due to animal (cat) (dog) hair and dander: Secondary | ICD-10-CM | POA: Diagnosis not present

## 2022-06-26 DIAGNOSIS — J301 Allergic rhinitis due to pollen: Secondary | ICD-10-CM | POA: Diagnosis not present

## 2022-06-26 DIAGNOSIS — J3089 Other allergic rhinitis: Secondary | ICD-10-CM | POA: Diagnosis not present

## 2022-07-01 ENCOUNTER — Other Ambulatory Visit (HOSPITAL_COMMUNITY): Payer: Self-pay

## 2022-07-01 DIAGNOSIS — J3089 Other allergic rhinitis: Secondary | ICD-10-CM | POA: Diagnosis not present

## 2022-07-01 DIAGNOSIS — J3081 Allergic rhinitis due to animal (cat) (dog) hair and dander: Secondary | ICD-10-CM | POA: Diagnosis not present

## 2022-07-01 DIAGNOSIS — J301 Allergic rhinitis due to pollen: Secondary | ICD-10-CM | POA: Diagnosis not present

## 2022-07-02 ENCOUNTER — Other Ambulatory Visit (HOSPITAL_COMMUNITY): Payer: Self-pay

## 2022-07-02 DIAGNOSIS — H5213 Myopia, bilateral: Secondary | ICD-10-CM | POA: Diagnosis not present

## 2022-07-09 ENCOUNTER — Other Ambulatory Visit (HOSPITAL_COMMUNITY): Payer: Self-pay

## 2022-07-09 DIAGNOSIS — J3089 Other allergic rhinitis: Secondary | ICD-10-CM | POA: Diagnosis not present

## 2022-07-09 DIAGNOSIS — J3081 Allergic rhinitis due to animal (cat) (dog) hair and dander: Secondary | ICD-10-CM | POA: Diagnosis not present

## 2022-07-18 DIAGNOSIS — J3081 Allergic rhinitis due to animal (cat) (dog) hair and dander: Secondary | ICD-10-CM | POA: Diagnosis not present

## 2022-07-18 DIAGNOSIS — J3089 Other allergic rhinitis: Secondary | ICD-10-CM | POA: Diagnosis not present

## 2022-07-18 DIAGNOSIS — J301 Allergic rhinitis due to pollen: Secondary | ICD-10-CM | POA: Diagnosis not present

## 2022-07-19 ENCOUNTER — Other Ambulatory Visit (HOSPITAL_COMMUNITY): Payer: Self-pay

## 2022-07-20 ENCOUNTER — Other Ambulatory Visit (HOSPITAL_COMMUNITY): Payer: Self-pay

## 2022-07-22 ENCOUNTER — Other Ambulatory Visit (HOSPITAL_COMMUNITY): Payer: Self-pay

## 2022-07-22 ENCOUNTER — Other Ambulatory Visit: Payer: Self-pay

## 2022-07-22 MED ORDER — ALBUTEROL SULFATE HFA 108 (90 BASE) MCG/ACT IN AERS
1.0000 | INHALATION_SPRAY | RESPIRATORY_TRACT | 0 refills | Status: DC | PRN
  Filled 2022-07-22: qty 6.7, 16d supply, fill #0

## 2022-07-22 MED ORDER — CETIRIZINE HCL 10 MG PO TABS
10.0000 mg | ORAL_TABLET | Freq: Every day | ORAL | 1 refills | Status: DC
  Filled 2022-07-22 – 2022-08-14 (×2): qty 30, 30d supply, fill #0
  Filled 2022-09-13: qty 30, 30d supply, fill #1

## 2022-07-24 DIAGNOSIS — J3089 Other allergic rhinitis: Secondary | ICD-10-CM | POA: Diagnosis not present

## 2022-07-24 DIAGNOSIS — J3081 Allergic rhinitis due to animal (cat) (dog) hair and dander: Secondary | ICD-10-CM | POA: Diagnosis not present

## 2022-07-24 DIAGNOSIS — J301 Allergic rhinitis due to pollen: Secondary | ICD-10-CM | POA: Diagnosis not present

## 2022-07-30 ENCOUNTER — Other Ambulatory Visit: Payer: Self-pay

## 2022-07-30 DIAGNOSIS — J3089 Other allergic rhinitis: Secondary | ICD-10-CM | POA: Diagnosis not present

## 2022-07-30 DIAGNOSIS — J3081 Allergic rhinitis due to animal (cat) (dog) hair and dander: Secondary | ICD-10-CM | POA: Diagnosis not present

## 2022-07-30 DIAGNOSIS — J301 Allergic rhinitis due to pollen: Secondary | ICD-10-CM | POA: Diagnosis not present

## 2022-08-03 DIAGNOSIS — G4733 Obstructive sleep apnea (adult) (pediatric): Secondary | ICD-10-CM | POA: Diagnosis not present

## 2022-08-05 ENCOUNTER — Other Ambulatory Visit: Payer: Self-pay

## 2022-08-09 ENCOUNTER — Other Ambulatory Visit: Payer: Self-pay

## 2022-08-09 DIAGNOSIS — J3081 Allergic rhinitis due to animal (cat) (dog) hair and dander: Secondary | ICD-10-CM | POA: Diagnosis not present

## 2022-08-09 DIAGNOSIS — J4520 Mild intermittent asthma, uncomplicated: Secondary | ICD-10-CM | POA: Diagnosis not present

## 2022-08-09 DIAGNOSIS — J3089 Other allergic rhinitis: Secondary | ICD-10-CM | POA: Diagnosis not present

## 2022-08-09 DIAGNOSIS — K219 Gastro-esophageal reflux disease without esophagitis: Secondary | ICD-10-CM | POA: Diagnosis not present

## 2022-08-13 DIAGNOSIS — J301 Allergic rhinitis due to pollen: Secondary | ICD-10-CM | POA: Diagnosis not present

## 2022-08-13 DIAGNOSIS — J3089 Other allergic rhinitis: Secondary | ICD-10-CM | POA: Diagnosis not present

## 2022-08-13 DIAGNOSIS — J3081 Allergic rhinitis due to animal (cat) (dog) hair and dander: Secondary | ICD-10-CM | POA: Diagnosis not present

## 2022-08-14 ENCOUNTER — Other Ambulatory Visit: Payer: Self-pay

## 2022-08-14 ENCOUNTER — Other Ambulatory Visit (HOSPITAL_COMMUNITY): Payer: Self-pay

## 2022-08-15 ENCOUNTER — Other Ambulatory Visit (HOSPITAL_COMMUNITY): Payer: Self-pay

## 2022-08-16 ENCOUNTER — Other Ambulatory Visit (HOSPITAL_COMMUNITY): Payer: Self-pay

## 2022-08-19 ENCOUNTER — Other Ambulatory Visit: Payer: Self-pay | Admitting: Family Medicine

## 2022-08-19 ENCOUNTER — Other Ambulatory Visit (HOSPITAL_COMMUNITY): Payer: Self-pay

## 2022-08-19 DIAGNOSIS — J3081 Allergic rhinitis due to animal (cat) (dog) hair and dander: Secondary | ICD-10-CM | POA: Diagnosis not present

## 2022-08-19 DIAGNOSIS — J3089 Other allergic rhinitis: Secondary | ICD-10-CM | POA: Diagnosis not present

## 2022-08-19 DIAGNOSIS — J301 Allergic rhinitis due to pollen: Secondary | ICD-10-CM | POA: Diagnosis not present

## 2022-08-19 DIAGNOSIS — K219 Gastro-esophageal reflux disease without esophagitis: Secondary | ICD-10-CM

## 2022-08-19 MED ORDER — PANTOPRAZOLE SODIUM 20 MG PO TBEC
20.0000 mg | DELAYED_RELEASE_TABLET | Freq: Every day | ORAL | 1 refills | Status: DC | PRN
Start: 2022-08-19 — End: 2023-05-20
  Filled 2022-08-19 – 2022-11-10 (×2): qty 90, 90d supply, fill #0
  Filled 2023-02-25: qty 90, 90d supply, fill #1

## 2022-08-20 ENCOUNTER — Other Ambulatory Visit (HOSPITAL_COMMUNITY): Payer: Self-pay

## 2022-08-23 ENCOUNTER — Telehealth: Payer: Commercial Managed Care - PPO | Admitting: Emergency Medicine

## 2022-08-23 DIAGNOSIS — J069 Acute upper respiratory infection, unspecified: Secondary | ICD-10-CM

## 2022-08-23 MED ORDER — IPRATROPIUM BROMIDE 0.03 % NA SOLN: 2.0000 | Freq: Two times a day (BID) | NASAL | 0 refills | Status: DC

## 2022-08-23 NOTE — Progress Notes (Signed)
E-Visit for Upper Respiratory Infection   We are sorry you are not feeling well.  Here is how we plan to help!  Based on what you have shared with me, it looks like you may have a viral upper respiratory infection.  Upper respiratory infections are caused by a large number of viruses; however, rhinovirus is the most common cause.   Symptoms vary from person to person, with common symptoms including sore throat, cough, fatigue or lack of energy and feeling of general discomfort.  A low-grade fever of up to 100.4 may present, but is often uncommon.  Symptoms vary however, and are closely related to a person's age or underlying illnesses.  The most common symptoms associated with an upper respiratory infection are nasal discharge or congestion, cough, sneezing, headache and pressure in the ears and face.  These symptoms usually persist for about 3 to 10 days, but can last up to 2 weeks.  It is important to know that upper respiratory infections do not cause serious illness or complications in most cases.    Upper respiratory infections can be transmitted from person to person, with the most common method of transmission being a person's hands.  The virus is able to live on the skin and can infect other persons for up to 2 hours after direct contact.  Also, these can be transmitted when someone coughs or sneezes; thus, it is important to cover the mouth to reduce this risk.  To keep the spread of the illness at bay, good hand hygiene is very important.  This is an infection that is most likely caused by a virus. There are no specific treatments other than to help you with the symptoms until the infection runs its course.  We are sorry you are not feeling well.  Here is how we plan to help...  I wouldn't recommend antibiotics at this point.  Based on the description of your symptoms, this sounds viral.  Antibiotics won't help with viral infections.  I would recommend an antihistamine if you aren't taking  one, such as claritin or zyrtec.  I will also prescribe ATROVENT nasal spray, which should help clear the sinuses.   IF you develop a fever or are not improving by about day 10 of illness, let us know.  It might be appropriate to start an antibiotic at that point.   For nasal congestion, you may use an oral decongestants such as Mucinex D or if you have glaucoma or high blood pressure use plain Mucinex.  Saline nasal spray or nasal drops can help and can safely be used as often as needed for congestion.    If you do not have a history of heart disease, hypertension, diabetes or thyroid disease, prostate/bladder issues or glaucoma, you may also use Sudafed to treat nasal congestion.  It is highly recommended that you consult with a pharmacist or your primary care physician to ensure this medication is safe for you to take.     If you have a cough, you may use cough suppressants such as Delsym and Robitussin.  If you have glaucoma or high blood pressure, you can also use Coricidin HBP.     If you have a sore or scratchy throat, use a saltwater gargle-  to  teaspoon of salt dissolved in a 4-ounce to 8-ounce glass of warm water.  Gargle the solution for approximately 15-30 seconds and then spit.  It is important not to swallow the solution.  You can also use throat  lozenges/cough drops and Chloraseptic spray to help with throat pain or discomfort.  Warm or cold liquids can also be helpful in relieving throat pain.  For headache, pain or general discomfort, you can use Ibuprofen or Tylenol as directed.   Some authorities believe that zinc sprays or the use of Echinacea may shorten the course of your symptoms.   HOME CARE Only take medications as instructed by your medical team. Be sure to drink plenty of fluids. Water is fine as well as fruit juices, sodas and electrolyte beverages. You may want to stay away from caffeine or alcohol. If you are nauseated, try taking small sips of liquids. How do you  know if you are getting enough fluid? Your urine should be a pale yellow or almost colorless. Get rest. Taking a steamy shower or using a humidifier may help nasal congestion and ease sore throat pain. You can place a towel over your head and breathe in the steam from hot water coming from a faucet. Using a saline nasal spray works much the same way. Cough drops, hard candies and sore throat lozenges may ease your cough. Avoid close contacts especially the very young and the elderly Cover your mouth if you cough or sneeze Always remember to wash your hands.   GET HELP RIGHT AWAY IF: You develop worsening fever. If your symptoms do not improve within 10 days You develop yellow or green discharge from your nose over 3 days. You have coughing fits You develop a severe head ache or visual changes. You develop shortness of breath, difficulty breathing or start having chest pain Your symptoms persist after you have completed your treatment plan  MAKE SURE YOU  Understand these instructions. Will watch your condition. Will get help right away if you are not doing well or get worse.  Thank you for choosing an e-visit.  Your e-visit answers were reviewed by a board certified advanced clinical practitioner to complete your personal care plan. Depending upon the condition, your plan could have included both over the counter or prescription medications.  Please review your pharmacy choice. Make sure the pharmacy is open so you can pick up prescription now. If there is a problem, you may contact your provider through [employer] and have the prescription routed to another pharmacy.  Your safety is important to [place]. If you have drug allergies check your prescription carefully.   For the next 24 hours you can use MyChart to ask questions about today's visit, request a non-urgent call back, or ask for a work or school excuse. You will get an email in the next two days asking about your experience.  I hope that your e-visit has been valuable and will speed your recovery.    Approximately 5 minutes was used in reviewing the patient's chart, questionnaire, prescribing medications, and documentation.

## 2022-08-28 ENCOUNTER — Other Ambulatory Visit (HOSPITAL_BASED_OUTPATIENT_CLINIC_OR_DEPARTMENT_OTHER): Payer: Self-pay

## 2022-08-28 ENCOUNTER — Ambulatory Visit: Payer: Commercial Managed Care - PPO | Admitting: Family Medicine

## 2022-08-28 ENCOUNTER — Encounter: Payer: Self-pay | Admitting: Family Medicine

## 2022-08-28 VITALS — BP 110/80 | HR 77 | Temp 97.8°F | Ht 75.0 in | Wt 277.0 lb

## 2022-08-28 DIAGNOSIS — Z6834 Body mass index (BMI) 34.0-34.9, adult: Secondary | ICD-10-CM | POA: Diagnosis not present

## 2022-08-28 DIAGNOSIS — J301 Allergic rhinitis due to pollen: Secondary | ICD-10-CM | POA: Diagnosis not present

## 2022-08-28 DIAGNOSIS — E669 Obesity, unspecified: Secondary | ICD-10-CM

## 2022-08-28 DIAGNOSIS — J3081 Allergic rhinitis due to animal (cat) (dog) hair and dander: Secondary | ICD-10-CM | POA: Diagnosis not present

## 2022-08-28 DIAGNOSIS — H66002 Acute suppurative otitis media without spontaneous rupture of ear drum, left ear: Secondary | ICD-10-CM

## 2022-08-28 DIAGNOSIS — J3089 Other allergic rhinitis: Secondary | ICD-10-CM | POA: Diagnosis not present

## 2022-08-28 MED ORDER — AMOXICILLIN-POT CLAVULANATE 875-125 MG PO TABS
1.0000 | ORAL_TABLET | Freq: Two times a day (BID) | ORAL | 0 refills | Status: AC
Start: 2022-08-28 — End: 2022-09-04
  Filled 2022-08-28: qty 14, 7d supply, fill #0

## 2022-08-28 NOTE — Assessment & Plan Note (Signed)
On 7.5 mg zepbound currently. He is on his last box, states that he will consider paying for it out pf pocket with the coupon. If he cannot afford this, then I recommended changing him to phentermine 15 mg capsules daily and topiramate 25 mg at bedtime. Pt will message me next month and let me know if he decides to switch. We discussed the risks/benefit to the medications.

## 2022-08-28 NOTE — Progress Notes (Signed)
Established Patient Office Visit  Subjective   Patient ID: Sean Allen, male    DOB: 03/15/1986  Age: 37 y.o. MRN: [id number]  Chief Complaint  Patient presents with   Medical Management of Chronic Issues    Pt is here for follow up/ weight check today. He reports that he is feeling good on the medication. Has lost about 16 pounds in the last 3 months. States that he is tolerating it well, is disappointed that his insurance  is not going to be covering the medication after next month. He reports that he will try to pay out of pocket for it in the future.   Pt is also reporting upper respiratory symptoms. He reports he has cough and congestion, especially in the morning, then it clears about noon, then it increased towards the end of the day. He states his symptoms have been going on for more than 10 days, not improving.     Current Outpatient Medications  Medication Instructions   albuterol (VENTOLIN HFA) 108 (90 Base) MCG/ACT inhaler Inhale 1-2 puffs into the lungs every 4 (four) to 6 (six) hours as needed for cough/wheeze.   amoxicillin-clavulanate (AUGMENTIN) 875-125 MG tablet 1 tablet, Oral, 2 times daily   cetirizine (ZYRTEC) 10 mg, Oral, Daily   EPINEPHrine (EPIPEN 2-PAK) 0.3 mg/0.3 mL IJ SOAJ injection Use as directed. Inject as needed.   meloxicam (MOBIC) 15 MG tablet Take 1 tablet by mouth daily as needed for pain.   Multiple Vitamins-Minerals (MULTIVITAMIN ADULT PO) Oral   Omega-3 Fatty Acids (FISH OIL PO) Oral, Daily   ondansetron (ZOFRAN-ODT) 4 MG disintegrating tablet Dissolve 1 tablet (4 mg total) by mouth every 8 (eight) hours as needed for nausea or vomiting (for nausea from wegovy or other source).   pantoprazole (PROTONIX) 20 mg, Oral, Daily PRN   PRESCRIPTION MEDICATION Allergy injections by Dr Triplett Callas   vitamin C 2,000 mg, Oral, Daily   Zepbound 7.5 mg, Subcutaneous, Weekly    Patient Active Problem List   Diagnosis Date Noted   Plantar fasciitis 01/27/2022    Obesity (BMI 30-39.9) 11/13/2021   Allergic rhinitis due to animal (cat) (dog) hair and dander 11/22/2020   Mild intermittent asthma 11/22/2020   OSA (obstructive sleep apnea) 02/14/2015   Varicocele, evaluated by urologist in the past 08/30/2013   Hx of retinal detachment - [facility] 08/30/2013   Asthma - sees Allergy and Asthma specialist 08/30/2013   Allergic rhinitis 08/30/2013   GERD (gastroesophageal reflux disease) 08/30/2013      Review of Systems  All other systems reviewed and are negative.     Objective:     BP 110/80 (BP Location: Left Arm, Patient Position: Sitting, Cuff Size: Large)   Pulse 77   Temp 97.8 F (36.6 C) (Oral)   Ht 6\' 3"  (1.905 m)   Wt 277 lb (125.6 kg)   SpO2 99%   BMI 34.62 kg/m    Physical Exam Vitals reviewed.  Constitutional:      Appearance: Normal appearance. He is well-groomed. He is obese.  HENT:     Right Ear: Tympanic membrane normal.     Left Ear: Tympanic membrane is erythematous and bulging.     Mouth/Throat:     Mouth: Mucous membranes are moist.     Pharynx: No posterior oropharyngeal erythema.  Eyes:     Conjunctiva/sclera: Conjunctivae normal.  Cardiovascular:     Rate and Rhythm: Normal rate and regular rhythm.     Heart sounds:  S1 normal and S2 normal. No murmur heard. Pulmonary:     Effort: Pulmonary effort is normal.     Breath sounds: Normal breath sounds and air entry. No wheezing.  Lymphadenopathy:     Cervical: No cervical adenopathy.  Neurological:     General: No focal deficit present.     Mental Status: He is alert and oriented to person, place, and time.     Gait: Gait is intact.  Psychiatric:        Mood and Affect: Mood and affect normal.      No results found for any visits on 08/28/22.    The ASCVD Risk score (Arnett DK, et al., 2019) failed to calculate for the following reasons:   The 2019 ASCVD risk score is only valid for ages 65 to 49    Assessment & Plan:  Obesity  (BMI 30-39.9) Assessment & Plan: On 7.5 mg zepbound currently. He is on his last box, states that he will consider paying for it out pf pocket with the coupon. If he cannot afford this, then I recommended changing him to phentermine 15 mg capsules daily and topiramate 25 mg at bedtime. Pt will message me next month and let me know if he decides to switch. We discussed the risks/benefit to the medications.    Non-recurrent acute suppurative otitis media of left ear without spontaneous rupture of tympanic membrane -     Amoxicillin-Pot Clavulanate; Take 1 tablet by mouth 2 (two) times daily for 7 days.  Dispense: 14 tablet; Refill: 0   Will treat with augmentin 875 mg BID for 7 days for the ear infection  Return in about 3 months (around 11/28/2022) for weight loss.    Karie Georges, MD

## 2022-09-02 DIAGNOSIS — G4733 Obstructive sleep apnea (adult) (pediatric): Secondary | ICD-10-CM | POA: Diagnosis not present

## 2022-09-03 DIAGNOSIS — J3081 Allergic rhinitis due to animal (cat) (dog) hair and dander: Secondary | ICD-10-CM | POA: Diagnosis not present

## 2022-09-03 DIAGNOSIS — J301 Allergic rhinitis due to pollen: Secondary | ICD-10-CM | POA: Diagnosis not present

## 2022-09-03 DIAGNOSIS — J3089 Other allergic rhinitis: Secondary | ICD-10-CM | POA: Diagnosis not present

## 2022-09-06 ENCOUNTER — Encounter: Payer: Self-pay | Admitting: Family Medicine

## 2022-09-06 ENCOUNTER — Other Ambulatory Visit: Payer: Self-pay

## 2022-09-06 DIAGNOSIS — E669 Obesity, unspecified: Secondary | ICD-10-CM

## 2022-09-06 MED ORDER — ZEPBOUND 10 MG/0.5ML ~~LOC~~ SOAJ
10.0000 mg | SUBCUTANEOUS | 0 refills | Status: DC
Start: 2022-09-06 — End: 2022-12-03
  Filled 2022-09-06: qty 6, 84d supply, fill #0
  Filled 2022-09-13: qty 2, 28d supply, fill #0
  Filled 2022-09-23: qty 6, 84d supply, fill #0
  Filled 2022-09-23 (×3): qty 2, 28d supply, fill #0

## 2022-09-12 ENCOUNTER — Other Ambulatory Visit: Payer: Self-pay

## 2022-09-13 ENCOUNTER — Other Ambulatory Visit (HOSPITAL_COMMUNITY): Payer: Self-pay

## 2022-09-13 ENCOUNTER — Other Ambulatory Visit: Payer: Self-pay

## 2022-09-16 DIAGNOSIS — J301 Allergic rhinitis due to pollen: Secondary | ICD-10-CM | POA: Diagnosis not present

## 2022-09-16 DIAGNOSIS — J3081 Allergic rhinitis due to animal (cat) (dog) hair and dander: Secondary | ICD-10-CM | POA: Diagnosis not present

## 2022-09-16 DIAGNOSIS — J3089 Other allergic rhinitis: Secondary | ICD-10-CM | POA: Diagnosis not present

## 2022-09-23 ENCOUNTER — Other Ambulatory Visit (HOSPITAL_COMMUNITY): Payer: Self-pay

## 2022-09-23 ENCOUNTER — Other Ambulatory Visit (HOSPITAL_BASED_OUTPATIENT_CLINIC_OR_DEPARTMENT_OTHER): Payer: Self-pay

## 2022-09-23 ENCOUNTER — Encounter (HOSPITAL_COMMUNITY): Payer: Self-pay

## 2022-09-26 DIAGNOSIS — J3089 Other allergic rhinitis: Secondary | ICD-10-CM | POA: Diagnosis not present

## 2022-09-26 DIAGNOSIS — J3081 Allergic rhinitis due to animal (cat) (dog) hair and dander: Secondary | ICD-10-CM | POA: Diagnosis not present

## 2022-09-26 DIAGNOSIS — J301 Allergic rhinitis due to pollen: Secondary | ICD-10-CM | POA: Diagnosis not present

## 2022-09-29 ENCOUNTER — Other Ambulatory Visit (HOSPITAL_COMMUNITY): Payer: Self-pay

## 2022-09-30 ENCOUNTER — Other Ambulatory Visit (HOSPITAL_COMMUNITY): Payer: Self-pay

## 2022-10-01 ENCOUNTER — Other Ambulatory Visit (HOSPITAL_COMMUNITY): Payer: Self-pay

## 2022-10-01 ENCOUNTER — Other Ambulatory Visit: Payer: Self-pay

## 2022-10-01 MED ORDER — EPINEPHRINE 0.3 MG/0.3ML IJ SOAJ
0.3000 mg | INTRAMUSCULAR | 1 refills | Status: AC | PRN
  Filled 2022-10-01: qty 2, 30d supply, fill #0
  Filled 2023-04-18: qty 2, 30d supply, fill #1

## 2022-10-02 DIAGNOSIS — J301 Allergic rhinitis due to pollen: Secondary | ICD-10-CM | POA: Diagnosis not present

## 2022-10-02 DIAGNOSIS — J3081 Allergic rhinitis due to animal (cat) (dog) hair and dander: Secondary | ICD-10-CM | POA: Diagnosis not present

## 2022-10-02 DIAGNOSIS — J3089 Other allergic rhinitis: Secondary | ICD-10-CM | POA: Diagnosis not present

## 2022-10-03 DIAGNOSIS — G4733 Obstructive sleep apnea (adult) (pediatric): Secondary | ICD-10-CM | POA: Diagnosis not present

## 2022-10-04 ENCOUNTER — Other Ambulatory Visit (HOSPITAL_COMMUNITY): Payer: Self-pay

## 2022-10-04 ENCOUNTER — Other Ambulatory Visit (HOSPITAL_BASED_OUTPATIENT_CLINIC_OR_DEPARTMENT_OTHER): Payer: Self-pay

## 2022-10-11 ENCOUNTER — Other Ambulatory Visit: Payer: Self-pay | Admitting: Podiatry

## 2022-10-11 ENCOUNTER — Other Ambulatory Visit (HOSPITAL_COMMUNITY): Payer: Self-pay

## 2022-10-14 ENCOUNTER — Other Ambulatory Visit (HOSPITAL_COMMUNITY): Payer: Self-pay

## 2022-10-15 DIAGNOSIS — J3081 Allergic rhinitis due to animal (cat) (dog) hair and dander: Secondary | ICD-10-CM | POA: Diagnosis not present

## 2022-10-15 DIAGNOSIS — J3089 Other allergic rhinitis: Secondary | ICD-10-CM | POA: Diagnosis not present

## 2022-10-15 DIAGNOSIS — J301 Allergic rhinitis due to pollen: Secondary | ICD-10-CM | POA: Diagnosis not present

## 2022-10-16 ENCOUNTER — Other Ambulatory Visit (HOSPITAL_COMMUNITY): Payer: Self-pay

## 2022-10-16 ENCOUNTER — Other Ambulatory Visit: Payer: Self-pay | Admitting: Podiatry

## 2022-10-16 ENCOUNTER — Telehealth: Payer: Self-pay | Admitting: Podiatry

## 2022-10-16 ENCOUNTER — Other Ambulatory Visit: Payer: Self-pay

## 2022-10-16 ENCOUNTER — Telehealth: Payer: Commercial Managed Care - PPO | Admitting: Physician Assistant

## 2022-10-16 DIAGNOSIS — J019 Acute sinusitis, unspecified: Secondary | ICD-10-CM

## 2022-10-16 DIAGNOSIS — B9789 Other viral agents as the cause of diseases classified elsewhere: Secondary | ICD-10-CM | POA: Diagnosis not present

## 2022-10-16 MED ORDER — CETIRIZINE HCL 10 MG PO TABS
10.0000 mg | ORAL_TABLET | Freq: Every day | ORAL | 6 refills | Status: DC
  Filled 2022-10-16: qty 30, 30d supply, fill #0
  Filled 2022-11-10: qty 30, 30d supply, fill #1
  Filled 2023-02-25: qty 30, 30d supply, fill #2
  Filled 2023-03-23: qty 30, 30d supply, fill #3
  Filled 2023-05-20: qty 30, 30d supply, fill #4
  Filled 2023-08-07: qty 30, 30d supply, fill #5
  Filled 2023-09-10: qty 30, 30d supply, fill #6

## 2022-10-16 MED ORDER — PREDNISONE 10 MG (21) PO TBPK
ORAL_TABLET | ORAL | 0 refills | Status: DC
Start: 2022-10-16 — End: 2022-12-03

## 2022-10-16 NOTE — Progress Notes (Signed)
I have spent 5 minutes in review of e-visit questionnaire, review and updating patient chart, medical decision making and response to patient.   Delmore Sear Cody Bali Lyn, PA-C    

## 2022-10-16 NOTE — Progress Notes (Signed)
E-Visit for Sinus Problems  We are sorry that you are not feeling well.  Here is how we plan to help!  Based on what you have shared with me it looks like you have sinusitis.  Sinusitis is inflammation and infection in the sinus cavities of the head.  Based on your presentation I believe you most likely have Acute Viral Sinusitis.This is an infection most likely caused by a virus. There is not specific treatment for viral sinusitis other than to help you with the symptoms until the infection runs its course.  You may use an oral decongestant such as Mucinex D or if you have glaucoma or high blood pressure use plain Mucinex. Saline nasal spray help and can safely be used as often as needed for congestion, I have prescribed: a prednisone pack to take as directed.   Some authorities believe that zinc sprays or the use of Echinacea may shorten the course of your symptoms.  Sinus infections are not as easily transmitted as other respiratory infection, however we still recommend that you avoid close contact with loved ones, especially the very young and elderly.  Remember to wash your hands thoroughly throughout the day as this is the number one way to prevent the spread of infection!  Home Care: Only take medications as instructed by your medical team. Do not take these medications with alcohol. A steam or ultrasonic humidifier can help congestion.  You can place a towel over your head and breathe in the steam from hot water coming from a faucet. Avoid close contacts especially the very young and the elderly. Cover your mouth when you cough or sneeze. Always remember to wash your hands.  Get Help Right Away If: You develop worsening fever or sinus pain. You develop a severe head ache or visual changes. Your symptoms persist after you have completed your treatment plan.  Make sure you Understand these instructions. Will watch your condition. Will get help right away if you are not doing well or  get worse.   Thank you for choosing an e-visit.  Your e-visit answers were reviewed by a board certified advanced clinical practitioner to complete your personal care plan. Depending upon the condition, your plan could have included both over the counter or prescription medications.  Please review your pharmacy choice. Make sure the pharmacy is open so you can pick up prescription now. If there is a problem, you may contact your provider through [employer] and have the prescription routed to another pharmacy.  Your safety is important to [place]. If you have drug allergies check your prescription carefully.   For the next 24 hours you can use MyChart to ask questions about today's visit, request a non-urgent call back, or ask for a work or school excuse. You will get an email in the next two days asking about your experience. I hope that your e-visit has been valuable and will speed your recovery.

## 2022-10-16 NOTE — Telephone Encounter (Signed)
Pt left message stating he was seen last and given a rx for mobic for his PF and he does not take it often but is out and needs a refill. Pt stated [facility] stated the refill was denied by Dr Ardelle Anton and pt is not sure why. If he need an appt let him know.   I did call pt and let him no the provider is out of the office and the office is closed for the rest of the week for the holiday so he will probably not hear back until next week. He said thank you for letting him know.

## 2022-10-18 ENCOUNTER — Other Ambulatory Visit: Payer: Self-pay | Admitting: Podiatry

## 2022-10-18 ENCOUNTER — Other Ambulatory Visit: Payer: Self-pay

## 2022-10-18 MED ORDER — MELOXICAM 15 MG PO TABS
15.0000 mg | ORAL_TABLET | Freq: Every day | ORAL | 0 refills | Status: DC | PRN
  Filled 2022-10-18: qty 30, 30d supply, fill #0

## 2022-10-21 DIAGNOSIS — J301 Allergic rhinitis due to pollen: Secondary | ICD-10-CM | POA: Diagnosis not present

## 2022-10-21 DIAGNOSIS — J3081 Allergic rhinitis due to animal (cat) (dog) hair and dander: Secondary | ICD-10-CM | POA: Diagnosis not present

## 2022-10-21 DIAGNOSIS — J3089 Other allergic rhinitis: Secondary | ICD-10-CM | POA: Diagnosis not present

## 2022-10-21 NOTE — Telephone Encounter (Signed)
Notified medication.

## 2022-10-23 ENCOUNTER — Other Ambulatory Visit: Payer: Self-pay | Admitting: Oncology

## 2022-10-23 DIAGNOSIS — Z006 Encounter for examination for normal comparison and control in clinical research program: Secondary | ICD-10-CM

## 2022-11-01 DIAGNOSIS — G4733 Obstructive sleep apnea (adult) (pediatric): Secondary | ICD-10-CM | POA: Diagnosis not present

## 2022-11-04 DIAGNOSIS — J301 Allergic rhinitis due to pollen: Secondary | ICD-10-CM | POA: Diagnosis not present

## 2022-11-04 DIAGNOSIS — J3081 Allergic rhinitis due to animal (cat) (dog) hair and dander: Secondary | ICD-10-CM | POA: Diagnosis not present

## 2022-11-04 DIAGNOSIS — J3089 Other allergic rhinitis: Secondary | ICD-10-CM | POA: Diagnosis not present

## 2022-11-10 ENCOUNTER — Other Ambulatory Visit (HOSPITAL_COMMUNITY): Payer: Self-pay

## 2022-11-11 ENCOUNTER — Other Ambulatory Visit (HOSPITAL_COMMUNITY): Payer: Self-pay

## 2022-11-11 DIAGNOSIS — J3081 Allergic rhinitis due to animal (cat) (dog) hair and dander: Secondary | ICD-10-CM | POA: Diagnosis not present

## 2022-11-11 DIAGNOSIS — J3089 Other allergic rhinitis: Secondary | ICD-10-CM | POA: Diagnosis not present

## 2022-11-11 DIAGNOSIS — J301 Allergic rhinitis due to pollen: Secondary | ICD-10-CM | POA: Diagnosis not present

## 2022-11-13 ENCOUNTER — Other Ambulatory Visit (HOSPITAL_COMMUNITY): Payer: Self-pay

## 2022-11-27 ENCOUNTER — Other Ambulatory Visit (HOSPITAL_COMMUNITY)
Admission: RE | Admit: 2022-11-27 | Discharge: 2022-11-27 | Disposition: A | Discharge status: Home / Self Care | Payer: Commercial Managed Care - PPO | Source: Ambulatory Visit | Attending: Oncology | Admitting: Oncology

## 2022-11-27 DIAGNOSIS — Z006 Encounter for examination for normal comparison and control in clinical research program: Secondary | ICD-10-CM | POA: Insufficient documentation

## 2022-11-28 ENCOUNTER — Ambulatory Visit: Payer: Commercial Managed Care - PPO | Admitting: Family Medicine

## 2022-11-28 DIAGNOSIS — J301 Allergic rhinitis due to pollen: Secondary | ICD-10-CM | POA: Diagnosis not present

## 2022-11-28 DIAGNOSIS — J3089 Other allergic rhinitis: Secondary | ICD-10-CM | POA: Diagnosis not present

## 2022-11-28 DIAGNOSIS — J3081 Allergic rhinitis due to animal (cat) (dog) hair and dander: Secondary | ICD-10-CM | POA: Diagnosis not present

## 2022-12-02 DIAGNOSIS — G4733 Obstructive sleep apnea (adult) (pediatric): Secondary | ICD-10-CM | POA: Diagnosis not present

## 2022-12-03 ENCOUNTER — Ambulatory Visit: Payer: Commercial Managed Care - PPO | Admitting: Family Medicine

## 2022-12-03 ENCOUNTER — Encounter: Payer: Self-pay | Admitting: Family Medicine

## 2022-12-03 VITALS — BP 108/70 | HR 65 | Temp 98.2°F | Ht 75.0 in | Wt 275.6 lb

## 2022-12-03 DIAGNOSIS — E669 Obesity, unspecified: Secondary | ICD-10-CM

## 2022-12-03 DIAGNOSIS — J3081 Allergic rhinitis due to animal (cat) (dog) hair and dander: Secondary | ICD-10-CM | POA: Diagnosis not present

## 2022-12-03 DIAGNOSIS — R300 Dysuria: Secondary | ICD-10-CM

## 2022-12-03 DIAGNOSIS — Z0000 Encounter for general adult medical examination without abnormal findings: Secondary | ICD-10-CM | POA: Diagnosis not present

## 2022-12-03 DIAGNOSIS — Z1159 Encounter for screening for other viral diseases: Secondary | ICD-10-CM

## 2022-12-03 DIAGNOSIS — Z13220 Encounter for screening for lipoid disorders: Secondary | ICD-10-CM | POA: Diagnosis not present

## 2022-12-03 DIAGNOSIS — J3089 Other allergic rhinitis: Secondary | ICD-10-CM | POA: Diagnosis not present

## 2022-12-03 DIAGNOSIS — J301 Allergic rhinitis due to pollen: Secondary | ICD-10-CM | POA: Diagnosis not present

## 2022-12-03 LAB — URINALYSIS, ROUTINE W REFLEX MICROSCOPIC
Bilirubin Urine: NEGATIVE
Hgb urine dipstick: NEGATIVE
Ketones, ur: NEGATIVE
Leukocytes,Ua: NEGATIVE
Nitrite: NEGATIVE
RBC / HPF: NONE SEEN (ref 0–?)
Specific Gravity, Urine: 1.01 (ref 1.000–1.030)
Total Protein, Urine: NEGATIVE
Urine Glucose: NEGATIVE
Urobilinogen, UA: 0.2 (ref 0.0–1.0)
WBC, UA: NONE SEEN (ref 0–?)
pH: 7 (ref 5.0–8.0)

## 2022-12-03 LAB — COMPREHENSIVE METABOLIC PANEL
ALT: 27 U/L (ref 0–53)
AST: 21 U/L (ref 0–37)
Albumin: 4.6 g/dL (ref 3.5–5.2)
Alkaline Phosphatase: 69 U/L (ref 39–117)
BUN: 18 mg/dL (ref 6–23)
CO2: 30 mEq/L (ref 19–32)
Calcium: 9.9 mg/dL (ref 8.4–10.5)
Chloride: 101 mEq/L (ref 96–112)
Creatinine, Ser: 1.15 mg/dL (ref 0.40–1.50)
GFR: 82.47 mL/min (ref >60.00)
Glucose, Bld: 102 mg/dL — ABNORMAL HIGH (ref 70–99)
Potassium: 4.7 mEq/L (ref 3.5–5.1)
Sodium: 138 mEq/L (ref 135–145)
Total Bilirubin: 0.7 mg/dL (ref 0.2–1.2)
Total Protein: 7.3 g/dL (ref 6.0–8.3)

## 2022-12-03 LAB — LIPID PANEL
Cholesterol: 196 mg/dL (ref 0–200)
HDL: 43.8 mg/dL (ref >39.00)
LDL Cholesterol: 123 mg/dL — ABNORMAL HIGH (ref 0–99)
NonHDL: 152.16
Total CHOL/HDL Ratio: 4
Triglycerides: 148 mg/dL (ref 0.0–149.0)
VLDL: 29.6 mg/dL (ref 0.0–40.0)

## 2022-12-03 LAB — CBC
HCT: 44.2 % (ref 39.0–52.0)
Hemoglobin: 14.7 g/dL (ref 13.0–17.0)
MCHC: 33.2 g/dL (ref 30.0–36.0)
MCV: 92.9 fl (ref 78.0–100.0)
Platelets: 222 10*3/uL (ref 150.0–400.0)
RBC: 4.76 Mil/uL (ref 4.22–5.81)
RDW: 12.9 % (ref 11.5–15.5)
WBC: 5.2 10*3/uL (ref 4.0–10.5)

## 2022-12-03 LAB — PSA: PSA: 0.51 ng/mL (ref 0.10–4.00)

## 2022-12-03 LAB — HEMOGLOBIN A1C: Hgb A1c MFr Bld: 4.9 % (ref 4.6–6.5)

## 2022-12-03 NOTE — Progress Notes (Signed)
Complete physical exam  Patient: Sean Allen   DOB: Mar 25, 1986   37 y.o. Male  MRN: [id number]  Subjective:    Chief Complaint  Patient presents with   Medical Management of Chronic Issues    Sean Allen is a 37 y.o. male who presents today for a complete physical exam. He reports consuming a general diet. Gym/ health club routine includes cardio, light weights, and 6 days a week. He generally feels well. He reports sleeping well. He does not have additional problems to discuss today.    Most recent fall risk assessment:     No data to display           Most recent depression screenings:    12/03/2022    8:33 AM 02/27/2022    8:07 AM  PHQ 2/9 Scores  PHQ - 2 Score 0 0  PHQ- 9 Score 0 0    Vision:Within last year and wears contacts, history of BL retinal detachment, sees retina yearly, Dr. Allyne Gee and [facility] retina and Dental: No current dental problems and Receives regular dental care  Patient Active Problem List   Diagnosis Date Noted   Plantar fasciitis 01/27/2022   Obesity (BMI 30-39.9) 11/13/2021   Allergic rhinitis due to animal (cat) (dog) hair and dander 11/22/2020   Mild intermittent asthma 11/22/2020   OSA (obstructive sleep apnea) 02/14/2015   Varicocele, evaluated by urologist in the past 08/30/2013   Hx of retinal detachment - [facility] 08/30/2013   Asthma - sees Allergy and Asthma specialist 08/30/2013   Allergic rhinitis 08/30/2013   GERD (gastroesophageal reflux disease) 08/30/2013      Patient Care Team: Karie Georges, MD as PCP - General (Family Medicine)   Outpatient Medications Prior to Visit  Medication Sig   albuterol (VENTOLIN HFA) 108 (90 Base) MCG/ACT inhaler Inhale 1-2 puffs into the lungs every 4 (four) to 6 (six) hours as needed for cough/wheeze.   Ascorbic Acid (VITAMIN C) 1000 MG tablet Take 2,000 mg by mouth daily.   cetirizine (ZYRTEC) 10 MG tablet Take 1 tablet (10 mg total) by mouth daily.    EPINEPHrine 0.3 mg/0.3 mL IJ SOAJ injection Inject 0.3 mg into the muscle as needed.   meloxicam (MOBIC) 15 MG tablet Take 1 tablet (15 mg total) by mouth daily as needed for pain.   Multiple Vitamins-Minerals (MULTIVITAMIN ADULT PO) Take by mouth.   Omega-3 Fatty Acids (FISH OIL PO) Take by mouth daily.   ondansetron (ZOFRAN-ODT) 4 MG disintegrating tablet Dissolve 1 tablet (4 mg total) by mouth every 8 (eight) hours as needed for nausea or vomiting (for nausea from wegovy or other source).   pantoprazole (PROTONIX) 20 MG tablet Take 1 tablet (20 mg total) by mouth daily as needed.   PRESCRIPTION MEDICATION Allergy injections by Dr Larimore Callas   [DISCONTINUED] tirzepatide (ZEPBOUND) 10 MG/0.5ML Pen Inject 10 mg into the skin once a week.   [DISCONTINUED] predniSONE (STERAPRED UNI-PAK 21 TAB) 10 MG (21) TBPK tablet Take following package directions   No facility-administered medications prior to visit.    Review of Systems  HENT:  Negative for hearing loss.   Eyes:  Negative for blurred vision.  Respiratory:  Negative for shortness of breath.   Cardiovascular:  Negative for chest pain.  Gastrointestinal: Negative.   Genitourinary: Negative.   Musculoskeletal:  Negative for back pain.  Neurological:  Negative for headaches.  Psychiatric/Behavioral:  Negative for depression.   All other systems reviewed and are negative.  Objective:     BP 108/70 (BP Location: Left Arm, Patient Position: Sitting, Cuff Size: Large)   Pulse 65   Temp 98.2 F (36.8 C) (Oral)   Ht 6\' 3"  (1.905 m)   Wt 275 lb 9.6 oz (125 kg)   SpO2 99%   BMI 34.45 kg/m    Physical Exam Vitals reviewed.  Constitutional:      Appearance: Normal appearance. He is well-groomed and normal weight.  HENT:     Right Ear: Tympanic membrane and ear canal normal.     Left Ear: Ear canal normal. Tympanic membrane is erythematous.     Mouth/Throat:     Mouth: Mucous membranes are moist.     Pharynx: No posterior  oropharyngeal erythema.  Eyes:     Extraocular Movements: Extraocular movements intact.     Conjunctiva/sclera: Conjunctivae normal.  Neck:     Thyroid: No thyromegaly.  Cardiovascular:     Rate and Rhythm: Normal rate and regular rhythm.     Heart sounds: S1 normal and S2 normal. No murmur heard. Pulmonary:     Effort: Pulmonary effort is normal.     Breath sounds: Normal breath sounds and air entry. No rales.  Abdominal:     General: Abdomen is flat. Bowel sounds are normal.  Musculoskeletal:     Right lower leg: No edema.     Left lower leg: No edema.  Lymphadenopathy:     Cervical: No cervical adenopathy.  Neurological:     General: No focal deficit present.     Mental Status: He is alert and oriented to person, place, and time.     Gait: Gait is intact.  Psychiatric:        Mood and Affect: Mood and affect normal.      No results found for any visits on 12/03/22.     Assessment & Plan:    Routine Health Maintenance and Physical Exam  Immunization History  Administered Date(s) Administered   Influenza Split 01/23/2015   Influenza, High Dose Seasonal PF 04/09/2016, 05/19/2017, 05/11/2018, 06/25/2019   Influenza,inj,Quad PF,6+ Mos 01/20/2018, 01/21/2020   Influenza-Unspecified 01/24/2017, 02/04/2019, 01/18/2021, 01/13/2022   PFIZER(Purple Top)SARS-COV-2 Vaccination 04/30/2019, 05/21/2019, 06/25/2019, 01/28/2020   PNEUMOCOCCAL CONJUGATE-20 02/26/2021   Tdap 12/06/2014    Health Maintenance  Topic Date Due   INFLUENZA VACCINE  11/14/2022   COVID-19 Vaccine (5 - 2023-24 season) 08/28/2023 (Originally 12/14/2021)   Hepatitis C Screening  08/28/2023 (Originally 06/08/2005)   DTaP/Tdap/Td (2 - Td or Tdap) 12/05/2024   HIV Screening  Completed   HPV VACCINES  Aged Out    Discussed health benefits of physical activity, and encouraged him to engage in regular exercise appropriate for his age and condition.  Need for hepatitis C screening test -     Hepatitis C  antibody  Obesity (BMI 30-39.9) -     Hemoglobin A1c  Lipid screening -     Lipid panel  Preventative health care -     Comprehensive metabolic panel -     CBC  Normal physical exam except for erythema of the left TM, pt is reporting some left upper teeth pain but no fever/chills, no sore throat, no ear pain. I advised that if any of these symptoms occur to call the office and I will call in some abx. Pt also mentioned 1 week history of burning with urination, states that it is off and on, seems to be improving, will check PSA and UA with regular labs aslo.  Return in about 6 months (around 06/05/2023) for weight loss.     Karie Georges, MD

## 2022-12-04 LAB — HEPATITIS C ANTIBODY: Hepatitis C Ab: NONREACTIVE

## 2022-12-10 LAB — GENECONNECT MOLECULAR SCREEN: Genetic Analysis Overall Interpretation: NEGATIVE

## 2022-12-23 DIAGNOSIS — J301 Allergic rhinitis due to pollen: Secondary | ICD-10-CM | POA: Diagnosis not present

## 2022-12-23 DIAGNOSIS — J3081 Allergic rhinitis due to animal (cat) (dog) hair and dander: Secondary | ICD-10-CM | POA: Diagnosis not present

## 2022-12-23 DIAGNOSIS — J3089 Other allergic rhinitis: Secondary | ICD-10-CM | POA: Diagnosis not present

## 2022-12-24 ENCOUNTER — Ambulatory Visit: Payer: Commercial Managed Care - PPO | Admitting: Family Medicine

## 2023-01-02 DIAGNOSIS — G4733 Obstructive sleep apnea (adult) (pediatric): Secondary | ICD-10-CM | POA: Diagnosis not present

## 2023-01-03 DIAGNOSIS — J3089 Other allergic rhinitis: Secondary | ICD-10-CM | POA: Diagnosis not present

## 2023-01-03 DIAGNOSIS — J301 Allergic rhinitis due to pollen: Secondary | ICD-10-CM | POA: Diagnosis not present

## 2023-01-03 DIAGNOSIS — J3081 Allergic rhinitis due to animal (cat) (dog) hair and dander: Secondary | ICD-10-CM | POA: Diagnosis not present

## 2023-01-14 DIAGNOSIS — J3089 Other allergic rhinitis: Secondary | ICD-10-CM | POA: Diagnosis not present

## 2023-01-14 DIAGNOSIS — J3081 Allergic rhinitis due to animal (cat) (dog) hair and dander: Secondary | ICD-10-CM | POA: Diagnosis not present

## 2023-01-14 DIAGNOSIS — J301 Allergic rhinitis due to pollen: Secondary | ICD-10-CM | POA: Diagnosis not present

## 2023-01-20 DIAGNOSIS — J3089 Other allergic rhinitis: Secondary | ICD-10-CM | POA: Diagnosis not present

## 2023-01-20 DIAGNOSIS — J3081 Allergic rhinitis due to animal (cat) (dog) hair and dander: Secondary | ICD-10-CM | POA: Diagnosis not present

## 2023-01-20 DIAGNOSIS — J301 Allergic rhinitis due to pollen: Secondary | ICD-10-CM | POA: Diagnosis not present

## 2023-01-22 DIAGNOSIS — J3081 Allergic rhinitis due to animal (cat) (dog) hair and dander: Secondary | ICD-10-CM | POA: Diagnosis not present

## 2023-01-22 DIAGNOSIS — J3089 Other allergic rhinitis: Secondary | ICD-10-CM | POA: Diagnosis not present

## 2023-01-27 DIAGNOSIS — J3081 Allergic rhinitis due to animal (cat) (dog) hair and dander: Secondary | ICD-10-CM | POA: Diagnosis not present

## 2023-01-27 DIAGNOSIS — J3089 Other allergic rhinitis: Secondary | ICD-10-CM | POA: Diagnosis not present

## 2023-01-27 DIAGNOSIS — J301 Allergic rhinitis due to pollen: Secondary | ICD-10-CM | POA: Diagnosis not present

## 2023-01-31 DIAGNOSIS — G4733 Obstructive sleep apnea (adult) (pediatric): Secondary | ICD-10-CM | POA: Diagnosis not present

## 2023-02-04 DIAGNOSIS — J3089 Other allergic rhinitis: Secondary | ICD-10-CM | POA: Diagnosis not present

## 2023-02-04 DIAGNOSIS — J301 Allergic rhinitis due to pollen: Secondary | ICD-10-CM | POA: Diagnosis not present

## 2023-02-04 DIAGNOSIS — J3081 Allergic rhinitis due to animal (cat) (dog) hair and dander: Secondary | ICD-10-CM | POA: Diagnosis not present

## 2023-02-10 DIAGNOSIS — J3089 Other allergic rhinitis: Secondary | ICD-10-CM | POA: Diagnosis not present

## 2023-02-10 DIAGNOSIS — J301 Allergic rhinitis due to pollen: Secondary | ICD-10-CM | POA: Diagnosis not present

## 2023-02-10 DIAGNOSIS — J3081 Allergic rhinitis due to animal (cat) (dog) hair and dander: Secondary | ICD-10-CM | POA: Diagnosis not present

## 2023-02-18 DIAGNOSIS — J3089 Other allergic rhinitis: Secondary | ICD-10-CM | POA: Diagnosis not present

## 2023-02-18 DIAGNOSIS — J301 Allergic rhinitis due to pollen: Secondary | ICD-10-CM | POA: Diagnosis not present

## 2023-02-18 DIAGNOSIS — J3081 Allergic rhinitis due to animal (cat) (dog) hair and dander: Secondary | ICD-10-CM | POA: Diagnosis not present

## 2023-02-24 DIAGNOSIS — J3081 Allergic rhinitis due to animal (cat) (dog) hair and dander: Secondary | ICD-10-CM | POA: Diagnosis not present

## 2023-02-24 DIAGNOSIS — J301 Allergic rhinitis due to pollen: Secondary | ICD-10-CM | POA: Diagnosis not present

## 2023-02-24 DIAGNOSIS — J3089 Other allergic rhinitis: Secondary | ICD-10-CM | POA: Diagnosis not present

## 2023-02-25 ENCOUNTER — Other Ambulatory Visit: Payer: Self-pay | Admitting: Podiatry

## 2023-02-25 ENCOUNTER — Other Ambulatory Visit (HOSPITAL_COMMUNITY): Payer: Self-pay

## 2023-02-26 ENCOUNTER — Other Ambulatory Visit: Payer: Self-pay

## 2023-02-26 ENCOUNTER — Other Ambulatory Visit (HOSPITAL_COMMUNITY): Payer: Self-pay

## 2023-02-26 MED ORDER — ALBUTEROL SULFATE HFA 108 (90 BASE) MCG/ACT IN AERS
1.0000 | INHALATION_SPRAY | RESPIRATORY_TRACT | 0 refills | Status: DC
  Filled 2023-02-26: qty 6.7, 17d supply, fill #0

## 2023-02-26 MED ORDER — MELOXICAM 15 MG PO TABS
15.0000 mg | ORAL_TABLET | Freq: Every day | ORAL | 0 refills | Status: DC | PRN
  Filled 2023-02-26: qty 30, 30d supply, fill #0

## 2023-03-03 DIAGNOSIS — J301 Allergic rhinitis due to pollen: Secondary | ICD-10-CM | POA: Diagnosis not present

## 2023-03-03 DIAGNOSIS — J3089 Other allergic rhinitis: Secondary | ICD-10-CM | POA: Diagnosis not present

## 2023-03-03 DIAGNOSIS — J3081 Allergic rhinitis due to animal (cat) (dog) hair and dander: Secondary | ICD-10-CM | POA: Diagnosis not present

## 2023-03-03 DIAGNOSIS — G4733 Obstructive sleep apnea (adult) (pediatric): Secondary | ICD-10-CM | POA: Diagnosis not present

## 2023-03-05 ENCOUNTER — Other Ambulatory Visit (HOSPITAL_COMMUNITY): Payer: Self-pay

## 2023-03-12 DIAGNOSIS — J3081 Allergic rhinitis due to animal (cat) (dog) hair and dander: Secondary | ICD-10-CM | POA: Diagnosis not present

## 2023-03-12 DIAGNOSIS — J3089 Other allergic rhinitis: Secondary | ICD-10-CM | POA: Diagnosis not present

## 2023-03-12 DIAGNOSIS — J301 Allergic rhinitis due to pollen: Secondary | ICD-10-CM | POA: Diagnosis not present

## 2023-03-24 ENCOUNTER — Other Ambulatory Visit: Payer: Self-pay

## 2023-03-31 DIAGNOSIS — J3081 Allergic rhinitis due to animal (cat) (dog) hair and dander: Secondary | ICD-10-CM | POA: Diagnosis not present

## 2023-03-31 DIAGNOSIS — J301 Allergic rhinitis due to pollen: Secondary | ICD-10-CM | POA: Diagnosis not present

## 2023-03-31 DIAGNOSIS — J3089 Other allergic rhinitis: Secondary | ICD-10-CM | POA: Diagnosis not present

## 2023-04-02 DIAGNOSIS — J3081 Allergic rhinitis due to animal (cat) (dog) hair and dander: Secondary | ICD-10-CM | POA: Diagnosis not present

## 2023-04-02 DIAGNOSIS — G4733 Obstructive sleep apnea (adult) (pediatric): Secondary | ICD-10-CM | POA: Diagnosis not present

## 2023-04-10 ENCOUNTER — Telehealth: Payer: Commercial Managed Care - PPO | Admitting: Physician Assistant

## 2023-04-10 DIAGNOSIS — B9689 Other specified bacterial agents as the cause of diseases classified elsewhere: Secondary | ICD-10-CM | POA: Diagnosis not present

## 2023-04-10 DIAGNOSIS — J019 Acute sinusitis, unspecified: Secondary | ICD-10-CM

## 2023-04-10 MED ORDER — AMOXICILLIN-POT CLAVULANATE 875-125 MG PO TABS
1.0000 | ORAL_TABLET | Freq: Two times a day (BID) | ORAL | 0 refills | Status: DC
Start: 2023-04-10 — End: 2023-04-10

## 2023-04-10 MED ORDER — DOXYCYCLINE HYCLATE 100 MG PO TABS: 100.0000 mg | ORAL_TABLET | Freq: Two times a day (BID) | ORAL | 0 refills | Status: DC

## 2023-04-10 NOTE — Progress Notes (Signed)

## 2023-04-10 NOTE — Addendum Note (Signed)
Addended by: Margaretann Loveless on: 04/10/2023 11:39 AM   Modules accepted: Orders

## 2023-04-18 ENCOUNTER — Other Ambulatory Visit (HOSPITAL_COMMUNITY): Payer: Self-pay

## 2023-04-18 DIAGNOSIS — J3089 Other allergic rhinitis: Secondary | ICD-10-CM | POA: Diagnosis not present

## 2023-04-18 DIAGNOSIS — J301 Allergic rhinitis due to pollen: Secondary | ICD-10-CM | POA: Diagnosis not present

## 2023-04-18 DIAGNOSIS — J3081 Allergic rhinitis due to animal (cat) (dog) hair and dander: Secondary | ICD-10-CM | POA: Diagnosis not present

## 2023-04-25 DIAGNOSIS — J3081 Allergic rhinitis due to animal (cat) (dog) hair and dander: Secondary | ICD-10-CM | POA: Diagnosis not present

## 2023-04-25 DIAGNOSIS — J301 Allergic rhinitis due to pollen: Secondary | ICD-10-CM | POA: Diagnosis not present

## 2023-04-25 DIAGNOSIS — J3089 Other allergic rhinitis: Secondary | ICD-10-CM | POA: Diagnosis not present

## 2023-05-01 DIAGNOSIS — J3089 Other allergic rhinitis: Secondary | ICD-10-CM | POA: Diagnosis not present

## 2023-05-01 DIAGNOSIS — J3081 Allergic rhinitis due to animal (cat) (dog) hair and dander: Secondary | ICD-10-CM | POA: Diagnosis not present

## 2023-05-01 DIAGNOSIS — J301 Allergic rhinitis due to pollen: Secondary | ICD-10-CM | POA: Diagnosis not present

## 2023-05-06 DIAGNOSIS — J301 Allergic rhinitis due to pollen: Secondary | ICD-10-CM | POA: Diagnosis not present

## 2023-05-06 DIAGNOSIS — J3081 Allergic rhinitis due to animal (cat) (dog) hair and dander: Secondary | ICD-10-CM | POA: Diagnosis not present

## 2023-05-06 DIAGNOSIS — J3089 Other allergic rhinitis: Secondary | ICD-10-CM | POA: Diagnosis not present

## 2023-05-20 ENCOUNTER — Other Ambulatory Visit: Payer: Self-pay | Admitting: Family Medicine

## 2023-05-20 ENCOUNTER — Other Ambulatory Visit (HOSPITAL_COMMUNITY): Payer: Self-pay

## 2023-05-20 ENCOUNTER — Other Ambulatory Visit: Payer: Self-pay

## 2023-05-20 DIAGNOSIS — K219 Gastro-esophageal reflux disease without esophagitis: Secondary | ICD-10-CM

## 2023-05-20 MED ORDER — PANTOPRAZOLE SODIUM 20 MG PO TBEC
20.0000 mg | DELAYED_RELEASE_TABLET | Freq: Every day | ORAL | 0 refills | Status: DC | PRN
Start: 2023-05-20 — End: 2023-09-12
  Filled 2023-05-20: qty 90, 90d supply, fill #0

## 2023-05-20 MED ORDER — ALBUTEROL SULFATE HFA 108 (90 BASE) MCG/ACT IN AERS
1.0000 | INHALATION_SPRAY | Freq: Four times a day (QID) | RESPIRATORY_TRACT | 0 refills | Status: DC
  Filled 2023-05-20: qty 6.7, 30d supply, fill #0
  Filled 2023-05-20: qty 6.7, 20d supply, fill #0

## 2023-05-21 DIAGNOSIS — J3081 Allergic rhinitis due to animal (cat) (dog) hair and dander: Secondary | ICD-10-CM | POA: Diagnosis not present

## 2023-05-21 DIAGNOSIS — J301 Allergic rhinitis due to pollen: Secondary | ICD-10-CM | POA: Diagnosis not present

## 2023-05-21 DIAGNOSIS — J3089 Other allergic rhinitis: Secondary | ICD-10-CM | POA: Diagnosis not present

## 2023-05-27 DIAGNOSIS — J301 Allergic rhinitis due to pollen: Secondary | ICD-10-CM | POA: Diagnosis not present

## 2023-05-27 DIAGNOSIS — J3081 Allergic rhinitis due to animal (cat) (dog) hair and dander: Secondary | ICD-10-CM | POA: Diagnosis not present

## 2023-05-27 DIAGNOSIS — J3089 Other allergic rhinitis: Secondary | ICD-10-CM | POA: Diagnosis not present

## 2023-06-02 DIAGNOSIS — H35411 Lattice degeneration of retina, right eye: Secondary | ICD-10-CM | POA: Diagnosis not present

## 2023-06-02 DIAGNOSIS — J3089 Other allergic rhinitis: Secondary | ICD-10-CM | POA: Diagnosis not present

## 2023-06-02 DIAGNOSIS — J3081 Allergic rhinitis due to animal (cat) (dog) hair and dander: Secondary | ICD-10-CM | POA: Diagnosis not present

## 2023-06-02 DIAGNOSIS — J301 Allergic rhinitis due to pollen: Secondary | ICD-10-CM | POA: Diagnosis not present

## 2023-06-02 DIAGNOSIS — H31093 Other chorioretinal scars, bilateral: Secondary | ICD-10-CM | POA: Diagnosis not present

## 2023-06-02 DIAGNOSIS — H338 Other retinal detachments: Secondary | ICD-10-CM | POA: Diagnosis not present

## 2023-06-02 DIAGNOSIS — H35462 Secondary vitreoretinal degeneration, left eye: Secondary | ICD-10-CM | POA: Diagnosis not present

## 2023-06-05 ENCOUNTER — Ambulatory Visit: Payer: Commercial Managed Care - PPO | Admitting: Family Medicine

## 2023-06-09 DIAGNOSIS — J3089 Other allergic rhinitis: Secondary | ICD-10-CM | POA: Diagnosis not present

## 2023-06-09 DIAGNOSIS — J301 Allergic rhinitis due to pollen: Secondary | ICD-10-CM | POA: Diagnosis not present

## 2023-06-09 DIAGNOSIS — J3081 Allergic rhinitis due to animal (cat) (dog) hair and dander: Secondary | ICD-10-CM | POA: Diagnosis not present

## 2023-06-17 DIAGNOSIS — J301 Allergic rhinitis due to pollen: Secondary | ICD-10-CM | POA: Diagnosis not present

## 2023-06-17 DIAGNOSIS — J3089 Other allergic rhinitis: Secondary | ICD-10-CM | POA: Diagnosis not present

## 2023-06-17 DIAGNOSIS — J3081 Allergic rhinitis due to animal (cat) (dog) hair and dander: Secondary | ICD-10-CM | POA: Diagnosis not present

## 2023-06-25 DIAGNOSIS — J3081 Allergic rhinitis due to animal (cat) (dog) hair and dander: Secondary | ICD-10-CM | POA: Diagnosis not present

## 2023-06-25 DIAGNOSIS — J3089 Other allergic rhinitis: Secondary | ICD-10-CM | POA: Diagnosis not present

## 2023-06-30 DIAGNOSIS — J301 Allergic rhinitis due to pollen: Secondary | ICD-10-CM | POA: Diagnosis not present

## 2023-06-30 DIAGNOSIS — J3089 Other allergic rhinitis: Secondary | ICD-10-CM | POA: Diagnosis not present

## 2023-06-30 DIAGNOSIS — J3081 Allergic rhinitis due to animal (cat) (dog) hair and dander: Secondary | ICD-10-CM | POA: Diagnosis not present

## 2023-07-04 ENCOUNTER — Ambulatory Visit: Payer: Commercial Managed Care - PPO | Admitting: Family Medicine

## 2023-07-08 DIAGNOSIS — J3089 Other allergic rhinitis: Secondary | ICD-10-CM | POA: Diagnosis not present

## 2023-07-08 DIAGNOSIS — J3081 Allergic rhinitis due to animal (cat) (dog) hair and dander: Secondary | ICD-10-CM | POA: Diagnosis not present

## 2023-07-09 DIAGNOSIS — J3081 Allergic rhinitis due to animal (cat) (dog) hair and dander: Secondary | ICD-10-CM | POA: Diagnosis not present

## 2023-07-09 DIAGNOSIS — J3089 Other allergic rhinitis: Secondary | ICD-10-CM | POA: Diagnosis not present

## 2023-07-15 ENCOUNTER — Other Ambulatory Visit (HOSPITAL_COMMUNITY): Payer: Self-pay

## 2023-07-21 ENCOUNTER — Ambulatory Visit: Admitting: Family Medicine

## 2023-07-28 DIAGNOSIS — J301 Allergic rhinitis due to pollen: Secondary | ICD-10-CM | POA: Diagnosis not present

## 2023-07-28 DIAGNOSIS — J3089 Other allergic rhinitis: Secondary | ICD-10-CM | POA: Diagnosis not present

## 2023-07-28 DIAGNOSIS — J3081 Allergic rhinitis due to animal (cat) (dog) hair and dander: Secondary | ICD-10-CM | POA: Diagnosis not present

## 2023-07-30 DIAGNOSIS — G4733 Obstructive sleep apnea (adult) (pediatric): Secondary | ICD-10-CM | POA: Diagnosis not present

## 2023-08-04 DIAGNOSIS — J301 Allergic rhinitis due to pollen: Secondary | ICD-10-CM | POA: Diagnosis not present

## 2023-08-04 DIAGNOSIS — J3089 Other allergic rhinitis: Secondary | ICD-10-CM | POA: Diagnosis not present

## 2023-08-04 DIAGNOSIS — J3081 Allergic rhinitis due to animal (cat) (dog) hair and dander: Secondary | ICD-10-CM | POA: Diagnosis not present

## 2023-08-07 ENCOUNTER — Other Ambulatory Visit (HOSPITAL_COMMUNITY): Payer: Self-pay

## 2023-08-11 ENCOUNTER — Ambulatory Visit: Admitting: Family Medicine

## 2023-08-18 DIAGNOSIS — J301 Allergic rhinitis due to pollen: Secondary | ICD-10-CM | POA: Diagnosis not present

## 2023-08-18 DIAGNOSIS — J3089 Other allergic rhinitis: Secondary | ICD-10-CM | POA: Diagnosis not present

## 2023-08-18 DIAGNOSIS — J3081 Allergic rhinitis due to animal (cat) (dog) hair and dander: Secondary | ICD-10-CM | POA: Diagnosis not present

## 2023-09-02 DIAGNOSIS — J3089 Other allergic rhinitis: Secondary | ICD-10-CM | POA: Diagnosis not present

## 2023-09-02 DIAGNOSIS — J301 Allergic rhinitis due to pollen: Secondary | ICD-10-CM | POA: Diagnosis not present

## 2023-09-02 DIAGNOSIS — J3081 Allergic rhinitis due to animal (cat) (dog) hair and dander: Secondary | ICD-10-CM | POA: Diagnosis not present

## 2023-09-10 ENCOUNTER — Other Ambulatory Visit: Payer: Self-pay | Admitting: Internal Medicine

## 2023-09-10 DIAGNOSIS — K219 Gastro-esophageal reflux disease without esophagitis: Secondary | ICD-10-CM

## 2023-09-11 ENCOUNTER — Other Ambulatory Visit (HOSPITAL_COMMUNITY): Payer: Self-pay

## 2023-09-11 ENCOUNTER — Other Ambulatory Visit: Payer: Self-pay

## 2023-09-12 ENCOUNTER — Telehealth: Payer: Self-pay | Admitting: *Deleted

## 2023-09-12 ENCOUNTER — Encounter: Payer: Self-pay | Admitting: Family Medicine

## 2023-09-12 ENCOUNTER — Other Ambulatory Visit: Payer: Self-pay

## 2023-09-12 ENCOUNTER — Ambulatory Visit (INDEPENDENT_AMBULATORY_CARE_PROVIDER_SITE_OTHER): Admitting: Family Medicine

## 2023-09-12 VITALS — BP 136/80 | HR 58 | Temp 98.0°F | Ht 75.0 in | Wt 277.5 lb

## 2023-09-12 DIAGNOSIS — J3081 Allergic rhinitis due to animal (cat) (dog) hair and dander: Secondary | ICD-10-CM | POA: Diagnosis not present

## 2023-09-12 DIAGNOSIS — K219 Gastro-esophageal reflux disease without esophagitis: Secondary | ICD-10-CM

## 2023-09-12 DIAGNOSIS — J3089 Other allergic rhinitis: Secondary | ICD-10-CM | POA: Diagnosis not present

## 2023-09-12 DIAGNOSIS — J301 Allergic rhinitis due to pollen: Secondary | ICD-10-CM | POA: Diagnosis not present

## 2023-09-12 DIAGNOSIS — J309 Allergic rhinitis, unspecified: Secondary | ICD-10-CM

## 2023-09-12 MED ORDER — PANTOPRAZOLE SODIUM 20 MG PO TBEC
20.0000 mg | DELAYED_RELEASE_TABLET | Freq: Every day | ORAL | 1 refills | Status: DC | PRN
  Filled 2023-09-12: qty 90, 90d supply, fill #0
  Filled 2023-12-17: qty 90, 90d supply, fill #1

## 2023-09-12 NOTE — Telephone Encounter (Signed)
 Patient came in for an appt today and was rescheduled due to provider being out of the office.  Patient requested a refill on Protonix  and the last Rx was given by Dr Ival Marines.  After reviewing medication history, I saw where PCP prescribed this previously.  Refill were sent to [facility] and the patient is aware.

## 2023-09-17 DIAGNOSIS — J3089 Other allergic rhinitis: Secondary | ICD-10-CM | POA: Diagnosis not present

## 2023-09-17 DIAGNOSIS — J3081 Allergic rhinitis due to animal (cat) (dog) hair and dander: Secondary | ICD-10-CM | POA: Diagnosis not present

## 2023-09-18 ENCOUNTER — Telehealth: Payer: Self-pay | Admitting: Family Medicine

## 2023-09-22 NOTE — Progress Notes (Signed)
 Visit canceled

## 2023-09-25 DIAGNOSIS — J301 Allergic rhinitis due to pollen: Secondary | ICD-10-CM | POA: Diagnosis not present

## 2023-09-25 DIAGNOSIS — J3089 Other allergic rhinitis: Secondary | ICD-10-CM | POA: Diagnosis not present

## 2023-09-25 DIAGNOSIS — J3081 Allergic rhinitis due to animal (cat) (dog) hair and dander: Secondary | ICD-10-CM | POA: Diagnosis not present

## 2023-10-10 DIAGNOSIS — J3089 Other allergic rhinitis: Secondary | ICD-10-CM | POA: Diagnosis not present

## 2023-10-10 DIAGNOSIS — J301 Allergic rhinitis due to pollen: Secondary | ICD-10-CM | POA: Diagnosis not present

## 2023-10-10 DIAGNOSIS — J3081 Allergic rhinitis due to animal (cat) (dog) hair and dander: Secondary | ICD-10-CM | POA: Diagnosis not present

## 2023-10-13 ENCOUNTER — Ambulatory Visit: Admitting: Family Medicine

## 2023-10-14 ENCOUNTER — Encounter: Payer: Self-pay | Admitting: Family Medicine

## 2023-10-14 ENCOUNTER — Ambulatory Visit: Admitting: Family Medicine

## 2023-10-14 VITALS — BP 110/80 | HR 65 | Temp 98.2°F | Ht 75.0 in | Wt 277.5 lb

## 2023-10-14 DIAGNOSIS — E669 Obesity, unspecified: Secondary | ICD-10-CM | POA: Diagnosis not present

## 2023-10-14 DIAGNOSIS — J3089 Other allergic rhinitis: Secondary | ICD-10-CM | POA: Diagnosis not present

## 2023-10-14 DIAGNOSIS — J301 Allergic rhinitis due to pollen: Secondary | ICD-10-CM | POA: Diagnosis not present

## 2023-10-14 DIAGNOSIS — K219 Gastro-esophageal reflux disease without esophagitis: Secondary | ICD-10-CM | POA: Diagnosis not present

## 2023-10-14 DIAGNOSIS — J3081 Allergic rhinitis due to animal (cat) (dog) hair and dander: Secondary | ICD-10-CM | POA: Diagnosis not present

## 2023-10-14 NOTE — Assessment & Plan Note (Signed)
 Well controlled with his pantoprazole , this was filled for him last month, he will continue this as prescribed, 20 mg daily PRN

## 2023-10-14 NOTE — Progress Notes (Signed)
 Established Patient Office Visit  Subjective   Patient ID: Sean Allen, male    DOB: [birth year]  Age: 38 y.o. MRN: [id number]  Chief Complaint  Patient presents with   Medical Management of Chronic Issues    Pt is here for follow up today on weight loss. Pt works out 5-6 days per week, states that he has been able to lose inches around his waist but he reports that the number has not come down. We discussed using other medications for weight loss, pt reports that he has had side effects to phentermine in the past.  Is worried about taking other medications for weight loss because he is concerned about the side effects.  GERD-- pt reports good control of his symptoms with the 20 mg daily pantoprazole , no new issues.       Current Outpatient Medications  Medication Instructions   albuterol  (VENTOLIN  HFA) 108 (90 Base) MCG/ACT inhaler Inhale 1-2 puffs into the lungs 4 - 6 hours as needed for cough/wheeze.   cetirizine  (ZYRTEC ) 10 mg, Oral, Daily   EPINEPHrine  (EPI-PEN) 0.3 mg, Intramuscular, As needed   meloxicam  (MOBIC ) 15 mg, Oral, Daily PRN   Multiple Vitamins-Minerals (MULTIVITAMIN ADULT PO) Take by mouth.   Omega-3 Fatty Acids (FISH OIL PO) Daily   pantoprazole  (PROTONIX ) 20 mg, Oral, Daily PRN   PRESCRIPTION MEDICATION Allergy injections by Dr Frutoso   vitamin C 2,000 mg, Daily    Patient Active Problem List   Diagnosis Date Noted   Plantar fasciitis 01/27/2022   Obesity (BMI 30-39.9) 11/13/2021   Allergic rhinitis due to animal (cat) (dog) hair and dander 11/22/2020   Mild intermittent asthma 11/22/2020   OSA (obstructive sleep apnea) 02/14/2015   Varicocele, evaluated by urologist in the past 08/30/2013   Hx of retinal detachment - [facility] 08/30/2013   Asthma - sees Allergy and Asthma specialist 08/30/2013   Allergic rhinitis 08/30/2013   GERD (gastroesophageal reflux disease) 08/30/2013      Review of Systems  All other systems reviewed and  are negative.     Objective:     BP 110/80   Pulse 65   Temp 98.2 F (36.8 C) (Oral)   Ht 6' 3 (1.905 m)   Wt 277 lb 8 oz (125.9 kg)   SpO2 99%   BMI 34.69 kg/m    Physical Exam Vitals reviewed.  Constitutional:      Appearance: Normal appearance. He is well-groomed. He is obese.   Cardiovascular:     Rate and Rhythm: Normal rate and regular rhythm.     Heart sounds: S1 normal and S2 normal. No murmur heard. Pulmonary:     Effort: Pulmonary effort is normal.     Breath sounds: Normal breath sounds and air entry. No rales.  Abdominal:     General: Bowel sounds are normal.   Musculoskeletal:     Right lower leg: No edema.     Left lower leg: No edema.   Neurological:     General: No focal deficit present.     Mental Status: He is alert and oriented to person, place, and time.     Gait: Gait is intact.   Psychiatric:        Mood and Affect: Mood and affect normal.     No results found for any visits on 10/14/23.    The ASCVD Risk score (Arnett DK, et al., 2019) failed to calculate for the following reasons:   The 2019 ASCVD risk  score is only valid for ages 75 to 16    Assessment & Plan:  Gastroesophageal reflux disease without esophagitis Assessment & Plan: Well controlled with his pantoprazole , this was filled for him last month, he will continue this as prescribed, 20 mg daily PRN   Obesity (BMI 30-39.9)   I spent 20 minutes today discussed alternative methods for treatment for weight loss besides GLP medication. Pt does not wish to try any other medication at this time due to the possitilibty of side effects. I gave him the website of the compounding pharmacies for him to look at to consider getting GLP medication from them.   Return in about 5 months (around 03/15/2024) for annual physical exam.    Heron CHRISTELLA Sharper, MD

## 2023-10-14 NOTE — Patient Instructions (Signed)
 [email or web address]  [email or web address]

## 2023-10-22 DIAGNOSIS — J3081 Allergic rhinitis due to animal (cat) (dog) hair and dander: Secondary | ICD-10-CM | POA: Diagnosis not present

## 2023-10-22 DIAGNOSIS — J3089 Other allergic rhinitis: Secondary | ICD-10-CM | POA: Diagnosis not present

## 2023-10-22 DIAGNOSIS — H5213 Myopia, bilateral: Secondary | ICD-10-CM | POA: Diagnosis not present

## 2023-10-22 DIAGNOSIS — J301 Allergic rhinitis due to pollen: Secondary | ICD-10-CM | POA: Diagnosis not present

## 2023-10-25 ENCOUNTER — Telehealth

## 2023-10-25 DIAGNOSIS — W57XXXA Bitten or stung by nonvenomous insect and other nonvenomous arthropods, initial encounter: Secondary | ICD-10-CM | POA: Diagnosis not present

## 2023-10-25 DIAGNOSIS — S80869A Insect bite (nonvenomous), unspecified lower leg, initial encounter: Secondary | ICD-10-CM | POA: Diagnosis not present

## 2023-10-26 MED ORDER — DOXYCYCLINE HYCLATE 100 MG PO CAPS: 200.0000 mg | ORAL_CAPSULE | Freq: Once | ORAL | 0 refills | Status: AC

## 2023-10-26 NOTE — Progress Notes (Signed)
E-Visit for Tick Bite  Thank you for describing your tick bite, Here is how we plan to help! Based on the information that you shared with me it looks like you have An uncomplicated tick bite that just occurred and can be closely follow using the instructions in your care plan.  In most cases a tick bite is painless and does not itch.  Most tick bites in which the tick is quickly removed do not require prescriptions. Ticks can transmit several diseases if they are infected and remain attacked to your skin. Therefore the length that the tick was attached and any symptoms you have experienced after the bite are import to accurately develop your custom treatment plan. In most cases a single dose of doxycycline may prevent the development of a more serious condition.  Based on your information I have Provided a home care guide for tick bites and  instructions on when to call for help. and I have sent a single dose of doxycycline to the pharmacy you selected. Please make sure that you selected a pharmacy that is open now.  Which ticks  are associated with illness?  The Wood Tick (dog tick) is the size of a watermelon seed and can sometimes transmit [facility] spotted fever and [place] tick fever.   The Deer Tick (black-legged tick) is between the size of a poppy seed (pin head) and an apple seed, and can sometimes transmit Lyme disease.  A brown to black tick with a white splotch on its back is likely a male Amblyomma americanum ([place] tick). This tick has been associated with Southern Tick Associated illness ( STARI)  Lyme disease has become the most common tick-borne illness in the [place]. The risk of Lyme disease following a recognized deer tick bite is estimated to be 1%.  The majority of cases of Lyme disease start with a bull's eye rash at the site of the tick bite. The rash can occur days to weeks (typically 7-10 days) after a tick bite. Treatment with antibiotics is  indicated if this rash appears. Flu-like symptoms may accompany the rash, including: fever, chills, headaches, muscle aches, and fatigue. Removing ticks promptly may prevent tick borne disease.  What can be used to prevent Tick Bites?  Insect repellant with at leas 20% DEET. Wearing long pants with sock and shoes. Avoiding tall grass and heavily wooded areas. Checking your skin after being outdoors. Shower with a washcloth after outdoor exposures.  HOME CARE ADVICE FOR TICK BITE  Wood Tick Removal:  Use a pair of tweezers and grasp the wood tick close to the skin (on its head). Pull the wood tick straight upward without twisting or crushing it. Maintain a steady pressure until it releases its grip.   If tweezers aren't available, use fingers, a loop of thread around the jaws, or a needle between the jaws for traction.  Note: covering the tick with petroleum jelly, nail polish or rubbing alcohol doesn't work. Neither does touching the tick with a hot or cold object. Tiny Deer Tick Removal:   Needs to be scraped off with a knife blade or credit card edge. Place tick in a sealed container (e.g. glass jar, zip lock plastic bag), in case your doctor wants to see it. Tick's Head Removal:  If the wood tick's head breaks off in the skin, it must be removed. Clean the skin. Then use a sterile needle to uncover the head and lift it out or scrape it off.  If a very small piece of the head remains, the skin will eventually slough it off. Antibiotic Ointment:  Wash the wound and your hands with soap and water after removal to prevent catching any tick disease.  Apply an over the counter antibiotic ointment (e.g. bacitracin) to the bite once. Expected Course: Tick bites normally don't itch or hurt. That's why they often go unnoticed. Call Your Doctor If:  You can't remove the tick or the tick's head Fever, a severe head ache, or rash occur in the next 2 weeks Bite begins to look infected Lyme's  disease is common in your area You have not had a tetanus in the last 10 years Your current symptoms become worse    MAKE SURE YOU  Understand these instructions. Will watch your condition. Will get help right away if you are not doing well or get worse.    Thank you for choosing an e-visit.  Your e-visit answers were reviewed by a board certified advanced clinical practitioner to complete your personal care plan. Depending upon the condition, your plan could have included both over the counter or prescription medications.  Please review your pharmacy choice. Make sure the pharmacy is open so you can pick up prescription now. If there is a problem, you may contact your provider through [employer] and have the prescription routed to another pharmacy.  Your safety is important to [place]. If you have drug allergies check your prescription carefully.   For the next 24 hours you can use MyChart to ask questions about today's visit, request a non-urgent call back, or ask for a work or school excuse. You will get an email in the next two days asking about your experience. I hope that your e-visit has been valuable and will speed your recovery.   Approximately 5 minutes was spent documenting and reviewing patient's chart.

## 2023-10-27 DIAGNOSIS — J3089 Other allergic rhinitis: Secondary | ICD-10-CM | POA: Diagnosis not present

## 2023-10-27 DIAGNOSIS — J3081 Allergic rhinitis due to animal (cat) (dog) hair and dander: Secondary | ICD-10-CM | POA: Diagnosis not present

## 2023-10-27 DIAGNOSIS — J301 Allergic rhinitis due to pollen: Secondary | ICD-10-CM | POA: Diagnosis not present

## 2023-10-28 DIAGNOSIS — G4733 Obstructive sleep apnea (adult) (pediatric): Secondary | ICD-10-CM | POA: Diagnosis not present

## 2023-10-31 ENCOUNTER — Other Ambulatory Visit: Payer: Self-pay

## 2023-11-10 ENCOUNTER — Other Ambulatory Visit (HOSPITAL_COMMUNITY): Payer: Self-pay

## 2023-11-11 ENCOUNTER — Other Ambulatory Visit (HOSPITAL_COMMUNITY): Payer: Self-pay

## 2023-11-11 DIAGNOSIS — J3089 Other allergic rhinitis: Secondary | ICD-10-CM | POA: Diagnosis not present

## 2023-11-11 DIAGNOSIS — J3081 Allergic rhinitis due to animal (cat) (dog) hair and dander: Secondary | ICD-10-CM | POA: Diagnosis not present

## 2023-11-11 DIAGNOSIS — J301 Allergic rhinitis due to pollen: Secondary | ICD-10-CM | POA: Diagnosis not present

## 2023-11-11 MED ORDER — CETIRIZINE HCL 10 MG PO TABS
10.0000 mg | ORAL_TABLET | Freq: Every day | ORAL | 2 refills | Status: DC
  Filled 2023-11-11: qty 30, 30d supply, fill #0
  Filled 2023-12-17: qty 30, 30d supply, fill #1
  Filled 2024-01-20: qty 30, 30d supply, fill #2

## 2023-11-17 DIAGNOSIS — J3089 Other allergic rhinitis: Secondary | ICD-10-CM | POA: Diagnosis not present

## 2023-11-17 DIAGNOSIS — J301 Allergic rhinitis due to pollen: Secondary | ICD-10-CM | POA: Diagnosis not present

## 2023-11-17 DIAGNOSIS — J3081 Allergic rhinitis due to animal (cat) (dog) hair and dander: Secondary | ICD-10-CM | POA: Diagnosis not present

## 2023-11-24 DIAGNOSIS — J301 Allergic rhinitis due to pollen: Secondary | ICD-10-CM | POA: Diagnosis not present

## 2023-11-24 DIAGNOSIS — J3081 Allergic rhinitis due to animal (cat) (dog) hair and dander: Secondary | ICD-10-CM | POA: Diagnosis not present

## 2023-11-24 DIAGNOSIS — J3089 Other allergic rhinitis: Secondary | ICD-10-CM | POA: Diagnosis not present

## 2023-12-02 DIAGNOSIS — J3089 Other allergic rhinitis: Secondary | ICD-10-CM | POA: Diagnosis not present

## 2023-12-02 DIAGNOSIS — J3081 Allergic rhinitis due to animal (cat) (dog) hair and dander: Secondary | ICD-10-CM | POA: Diagnosis not present

## 2023-12-10 DIAGNOSIS — J301 Allergic rhinitis due to pollen: Secondary | ICD-10-CM | POA: Diagnosis not present

## 2023-12-10 DIAGNOSIS — J3089 Other allergic rhinitis: Secondary | ICD-10-CM | POA: Diagnosis not present

## 2023-12-10 DIAGNOSIS — J3081 Allergic rhinitis due to animal (cat) (dog) hair and dander: Secondary | ICD-10-CM | POA: Diagnosis not present

## 2023-12-18 ENCOUNTER — Encounter: Payer: Self-pay | Admitting: Family Medicine

## 2023-12-18 ENCOUNTER — Other Ambulatory Visit (HOSPITAL_COMMUNITY): Payer: Self-pay

## 2023-12-18 DIAGNOSIS — G4733 Obstructive sleep apnea (adult) (pediatric): Secondary | ICD-10-CM

## 2023-12-22 MED ORDER — TIRZEPATIDE-WEIGHT MANAGEMENT 2.5 MG/0.5ML ~~LOC~~ SOLN
2.5000 mg | SUBCUTANEOUS | 2 refills | Status: DC
  Filled 2023-12-22: qty 2, 28d supply, fill #0

## 2023-12-23 ENCOUNTER — Other Ambulatory Visit (HOSPITAL_COMMUNITY): Payer: Self-pay

## 2023-12-23 ENCOUNTER — Telehealth: Payer: Self-pay | Admitting: *Deleted

## 2023-12-23 DIAGNOSIS — G4733 Obstructive sleep apnea (adult) (pediatric): Secondary | ICD-10-CM

## 2023-12-23 MED ORDER — ZEPBOUND 2.5 MG/0.5ML ~~LOC~~ SOAJ
2.5000 mg | SUBCUTANEOUS | 5 refills | Status: AC
  Filled 2023-12-23 – 2024-05-16 (×3): qty 2, 28d supply, fill #0

## 2023-12-23 NOTE — Telephone Encounter (Signed)
 Copied from CRM [id number]. Topic: Clinical - Prescription Issue >> Dec 23, 2023 11:05 AM Aleatha BROCKS wrote: Reason for CRM: Mykel from [facility] [address] [place] [address] Phone: [phone] Fax: [phone]  says that they dont have the valves for Zepound and only have the injections

## 2023-12-23 NOTE — Telephone Encounter (Signed)
 Sorry new script sent

## 2023-12-24 ENCOUNTER — Other Ambulatory Visit (HOSPITAL_COMMUNITY): Payer: Self-pay

## 2023-12-25 ENCOUNTER — Other Ambulatory Visit (HOSPITAL_COMMUNITY): Payer: Self-pay

## 2023-12-30 DIAGNOSIS — J3089 Other allergic rhinitis: Secondary | ICD-10-CM | POA: Diagnosis not present

## 2023-12-30 DIAGNOSIS — J3081 Allergic rhinitis due to animal (cat) (dog) hair and dander: Secondary | ICD-10-CM | POA: Diagnosis not present

## 2023-12-30 DIAGNOSIS — J301 Allergic rhinitis due to pollen: Secondary | ICD-10-CM | POA: Diagnosis not present

## 2023-12-31 NOTE — Unmapped External Note (Signed)
 Assessment Note   Demographics Verification - Call Monitoring - Confidentiality      Member Verification: Member Verification    Call Monitoring Disclaimer: Call Monitoring Disclaimer   HIPAA Disclaimer: HIPAA Disclaimer     Person Providing Info on the Call   Who is the person providing the information on the call? Member      Limitations/Preferences   What, if any, physical limitations, health literacy, language needs and learning preferences do you have that I should be aware of when we talk?        Specify other language limitations        Specify other physical limitations        LCC Contact Type   Select the Health Your Way contact type: 4th or more contact-Telephonic Lifestyle Coaching           Lifestyle Coaching Weight Management Follow Up                    General Health Perception   How would you describe your health in general? My health is very good      SMART GOAL & SMART GOAL Follow Up     SMART GOAL Goal ongoing established routines for wt loss, increase muscle mass: quantity/2K-2300 cals pd focusing quality, protein intake (tracking w MFP) + 140 oz water pd + after work workout 4-5xpw 1 hr + cardio, strength alternating muscle groups w adjustments for adding variety already (+ weigh in before next call) already started thru next call.   Did the member set a SMART goal? Yes  Did the member meet their previous SMART goal?  Yes      Brief Summary of Member Status   What medical conditions has your doctor diagnosed you with?    Past Medical History:  Diagnosis Date  . Change in weight   . GERD (gastroesophageal reflux disease)   . Obstructive sleep apnea      Did the member score via Care Engine for any condition(s) that he/she did NOT confirm?    HYW:  What medical conditions are you most concerned about and why?    In between MD visits people often find a need to go to the ER or an Urgent Care facility.can you tell  me if you had any concerns or issues in which you went to an ER or Urgent care in the last 12 months?      Condition Specific Metrics   Height/Weight/BMI  /Weight: 276 lb/    Blood Pressure    Outcome Metrics      Medications   Current Medications[1]    There are no discontinued medications.     Medication Review         Depression Screening PHQ2/PHQ9 Exclusions   PHQ2 EXCLUSION CRITERIA:  Do NOT administer the PHQ-2 if any of the following apply to this member.   PHQ9 EXCLUSION CRITERIA: To determine if this assessment is right for you, I need to ask you some questions before we start.  Besides depression, have you been diagnosed with any other mental health issues such as: No applicable exclusions              Depression PHQ2/PHQ9 Screening Results     PHQ2  The PHQ-2 questions are scored from 0 (not at all) to 3 (nearly every day) and then added together: Score 0-2 = Not likely to be at risk for depression, Score 3-6 = At risk for depression (further screening  recommended). PHQ 2 Risk Score: 0 (12/31/2023  8:34 AM)       PHQ9 PHQ-9 Mini Module-If diagnosis of depression- show confirmed dx of depression and show PHQ-9 score & score description        Social Drivers of Health   Transportation Needs: No Transportation Needs (10/13/2023)   Received from [facility] - Transportation   . In the past 12 months, has lack of transportation kept you from medical appointments or from getting medications?: No   . In the past 12 months, has lack of transportation kept you from meetings, work, or from getting things needed for daily living?: No  Food Insecurity: No Food Insecurity (10/13/2023)   Received from [facility]   Hunger Vital Sign   . Within the past 12 months, you worried that your food would run out before you got the money to buy more.: Never true   . Within the past 12 months, the food you bought just didn't last and you didn't have money to  get more.: Never true  Stress: No Stress Concern Present (10/13/2023)   Received from [facility] - Occupational Stress Questionnaire   . Do you feel stress - tense, restless, nervous, or anxious, or unable to sleep at night because your mind is troubled all the time - these days?: Not at all  Social Connections: Socially Integrated (10/13/2023)   Received from [facility]   Social Connection and Isolation Panel   . In a typical week, how many times do you talk on the phone with family, friends, or neighbors?: More than three times a week   . How often do you get together with friends or relatives?: More than three times a week   . How often do you attend church or religious services?: More than 4 times per year   . Do you belong to any clubs or organizations such as church groups, unions, fraternal or athletic groups, or school groups?: Yes   . How often do you attend meetings of the clubs or organizations you belong to?: More than 4 times per year   . Are you married, widowed, divorced, separated, never married, or living with a partner?: Married  Tobacco Use: Low Risk  (10/14/2023)   Received from [facility]   Patient History   . Smoking Tobacco Use: Never   . Smokeless Tobacco Use: Never  Physical Activity: Sufficiently Active (10/13/2023)   Received from [facility]   Exercise Vital Sign   . On average, how many days per week do you engage in moderate to strenuous exercise (like a brisk walk)?: 6 days   . On average, how many minutes do you engage in exercise at this level?: 70 min  Financial Resource Strain: Low Risk  (10/13/2023)   Received from [facility]   Overall Financial Resource Strain (CARDIA)   . How hard is it for you to pay for the very basics like food, housing, medical care, and heating?: Not hard at all      Lab Results   Results     There are no results available from this visit.         Immunizations    Immunizations Mini Module (show all Q/A pairs answered during this encounter)       Intervention/Actions   Education Topics Discussed      Referrals & Referral Follow Up  MEP/Mobile App Registration & Education on Tools/Resources   Is the member registered on the Member Engagement Platform (MEP) or Mobile App?  Explain tools and resources available on Member Engagement Platform [facility]) and how to get the most benefit out of them.  Indicate resources reviewed with member: Yes-registered on [facility] 05/02/2023    Letters and brochures from coach    Next Scheduled Appointment      Date Provider Department Visit Type   02/11/2024 8:30 AM Almarie Shaver Care Management L[facility] Coach Follow Up Assmt- 1st Attempt         Follow Up Needs Identified   ALC FU MBR Concerns/Focus: WT Rates Health VG PHQ NEG Ht./Wt./BMI (any changes?): 75/276/34.5 (not weighed) LT 250 (MD encouraged)  Have you made any progress toward the goal(s) you set on our last call? Continued success w established routines/eating habits, ex. Updated Smart Goal:   Goal ongoing established routines for wt loss, increase muscle mass: quantity/2K-2300 cals pd focusing quality, protein intake (tracking w MFP) + 140 oz water pd + after work workout 4-5xpw 1 hr + cardio, strength alternating muscle groups w adjustments for adding variety already (+ weigh in before next call) already started thru next call. M 10 C 10.  Call Discussion: Maintains health VG, continued focus achieving LT healthy wt. Hasn't weighed, feels "going in the right direction". MD every 6 mos, annual in Dec. Stated continues working w Md wt loss rx approval. Feels w sleep apnea rx that supports this dx might support wt loss. Maintaining dietary patterns. Stated did change up workout w adding pull ups & core continues cardio daily and alternates muscle groups w stair stepper "on leg days" (did a recent challenge on 9/11). Researching ex,  proper protocols w cable machine. Goal "Build what already started" to lose more wt and build muscle. Makes adjustments in routine to accommodate schedule.  Benefits: overall health, joint health, healthy longevity Challenges: discussed some frustration over # on scale, finding food sources (higher protein, lower carb), managing hunger, work @ home, travel, holidays & celebrations  Education/interventions Provided: Reinforced steady loss, day by day keeping to targeted routines, recognizing ebb & flow normal + affirmed Mbr ongoing dedication to routines, sustainability, lifestyle change.  Affirmed Mbr monitoring progress, discerning learnings, changes to support continued "forward movement". Affirmed Mbr goal exploring variety of ex, researching proper protocols (affirmed body & brain) + attention to work/rest cycle. Encouraged ongoing MD support w goals, patterns, questions, concerns, suggested changes to support continued "forward movement". Validated Mbr mindset for ID personally meaningful, doable goals, mindset of doing the work + utilizing sources support, proactively part of own care team. Plan for next call:  Encouraged MEP resources. Pack List: Fitness: Increasing Core Stability, [id number], en-us , 14.6 Next call: evaluate goal progress + next steps. 2025 CALL 6 2025 12/1708/29 8:30 EST (MBR EST). "Sean Allen."  CP Mbr successfully continues exploring, establishing personally meaningful health change goals, achieving personal LT healthy wt goal. Discussed patterns, successes, challenges + basic recommendations, best practices for healthy eating, exercise, change process, including ongoing dedication to identified goals and ongoing learning, discerning adjustments to support sustainable progress. Mbr renewed personal goal. Affirmed ongoing attention to health, personal change goals, utilizing sources support, proactively being part of own self-care team.          [1] No current outpatient medications  on file.

## 2024-01-01 NOTE — Telephone Encounter (Signed)
 Can we ask the prior auth team to submit an appeal?

## 2024-01-02 ENCOUNTER — Telehealth: Payer: Self-pay

## 2024-01-02 ENCOUNTER — Other Ambulatory Visit: Payer: Self-pay

## 2024-01-02 ENCOUNTER — Other Ambulatory Visit (HOSPITAL_COMMUNITY): Payer: Self-pay

## 2024-01-02 NOTE — Telephone Encounter (Signed)
 Please let patient know.

## 2024-01-02 NOTE — Telephone Encounter (Signed)
 Pharmacy Patient Advocate Encounter   Received notification from Pt Calls Messages that prior authorization for Zepbound  10 is required/requested.   Insurance verification completed.   The patient is insured through [facility] .   Per test claim: PA required and submitted KEY/EOC/Request #: [id number] due to

## 2024-01-05 ENCOUNTER — Other Ambulatory Visit (HOSPITAL_COMMUNITY): Payer: Self-pay

## 2024-01-06 DIAGNOSIS — J3089 Other allergic rhinitis: Secondary | ICD-10-CM | POA: Diagnosis not present

## 2024-01-06 DIAGNOSIS — J3081 Allergic rhinitis due to animal (cat) (dog) hair and dander: Secondary | ICD-10-CM | POA: Diagnosis not present

## 2024-01-06 DIAGNOSIS — J301 Allergic rhinitis due to pollen: Secondary | ICD-10-CM | POA: Diagnosis not present

## 2024-01-07 ENCOUNTER — Telehealth: Payer: Self-pay | Admitting: Pharmacist

## 2024-01-07 NOTE — Telephone Encounter (Signed)
 PA for Zepbound  will require completion and submission of a manual form completed by the office.  Form sent to Dr. Ozell.

## 2024-01-19 DIAGNOSIS — J3089 Other allergic rhinitis: Secondary | ICD-10-CM | POA: Diagnosis not present

## 2024-01-19 DIAGNOSIS — J3081 Allergic rhinitis due to animal (cat) (dog) hair and dander: Secondary | ICD-10-CM | POA: Diagnosis not present

## 2024-01-19 DIAGNOSIS — J301 Allergic rhinitis due to pollen: Secondary | ICD-10-CM | POA: Diagnosis not present

## 2024-01-20 ENCOUNTER — Other Ambulatory Visit: Payer: Self-pay

## 2024-01-20 ENCOUNTER — Other Ambulatory Visit (HOSPITAL_COMMUNITY): Payer: Self-pay

## 2024-01-20 MED ORDER — ALBUTEROL SULFATE HFA 108 (90 BASE) MCG/ACT IN AERS
INHALATION_SPRAY | RESPIRATORY_TRACT | 0 refills | Status: DC
  Filled 2024-01-20: qty 6.7, 17d supply, fill #0

## 2024-01-21 ENCOUNTER — Other Ambulatory Visit: Payer: Self-pay

## 2024-01-21 ENCOUNTER — Other Ambulatory Visit (HOSPITAL_BASED_OUTPATIENT_CLINIC_OR_DEPARTMENT_OTHER): Payer: Self-pay

## 2024-01-21 MED ORDER — FLUZONE 0.5 ML IM SUSY
0.5000 mL | PREFILLED_SYRINGE | Freq: Once | INTRAMUSCULAR | 0 refills | Status: AC
  Filled 2024-01-21: qty 0.5, 1d supply, fill #0

## 2024-01-21 NOTE — Telephone Encounter (Signed)
 Wanted to follow up, have you been able to complete the PA form for Zepbound  and submit to the insurance yet?

## 2024-01-29 ENCOUNTER — Encounter: Payer: Self-pay | Admitting: Family Medicine

## 2024-01-29 NOTE — Telephone Encounter (Signed)
 Letter sent for appeal to the RX prior auth inbox

## 2024-01-30 NOTE — Telephone Encounter (Signed)
 Ok do you have the PA form? I didn't think there was since it was a coverage exclusion.SABRASABRA

## 2024-02-03 DIAGNOSIS — J3081 Allergic rhinitis due to animal (cat) (dog) hair and dander: Secondary | ICD-10-CM | POA: Diagnosis not present

## 2024-02-03 DIAGNOSIS — J3089 Other allergic rhinitis: Secondary | ICD-10-CM | POA: Diagnosis not present

## 2024-02-03 DIAGNOSIS — J301 Allergic rhinitis due to pollen: Secondary | ICD-10-CM | POA: Diagnosis not present

## 2024-02-04 ENCOUNTER — Other Ambulatory Visit (HOSPITAL_COMMUNITY): Payer: Self-pay

## 2024-02-11 DIAGNOSIS — J301 Allergic rhinitis due to pollen: Secondary | ICD-10-CM | POA: Diagnosis not present

## 2024-02-11 DIAGNOSIS — J3089 Other allergic rhinitis: Secondary | ICD-10-CM | POA: Diagnosis not present

## 2024-02-11 NOTE — Telephone Encounter (Signed)
 PCP completed the [employer] form, this was faxed to the PA team at [phone] attn: Devere Pandy and sent to be scanned.

## 2024-02-12 ENCOUNTER — Other Ambulatory Visit (HOSPITAL_COMMUNITY): Payer: Self-pay

## 2024-02-18 ENCOUNTER — Other Ambulatory Visit: Payer: Self-pay

## 2024-02-18 ENCOUNTER — Other Ambulatory Visit (HOSPITAL_COMMUNITY): Payer: Self-pay

## 2024-02-18 MED ORDER — CETIRIZINE HCL 10 MG PO TABS
10.0000 mg | ORAL_TABLET | Freq: Every day | ORAL | 3 refills | Status: AC
  Filled 2024-02-18: qty 30, 30d supply, fill #0
  Filled 2024-03-16: qty 30, 30d supply, fill #1
  Filled 2024-05-16: qty 30, 30d supply, fill #2

## 2024-02-20 ENCOUNTER — Other Ambulatory Visit (HOSPITAL_COMMUNITY): Payer: Self-pay

## 2024-02-24 ENCOUNTER — Other Ambulatory Visit (HOSPITAL_COMMUNITY): Payer: Self-pay

## 2024-02-24 NOTE — Telephone Encounter (Signed)
 Per Insurance:  The request was dismissed for the reason below:   The drug or product is specifically excluded under the [facility] pharmacy benefit and is not reviewable for approval. This does not imply the drug or product is not needed or being used inappropriately. The plan has chosen not to cover this drug therapy under the prescription drug benefit for any medical reason.  Full letter will be uploaded under the media tab.

## 2024-02-25 NOTE — Telephone Encounter (Signed)
 Please let patient know.

## 2024-03-05 DIAGNOSIS — J301 Allergic rhinitis due to pollen: Secondary | ICD-10-CM | POA: Diagnosis not present

## 2024-03-05 DIAGNOSIS — J3089 Other allergic rhinitis: Secondary | ICD-10-CM | POA: Diagnosis not present

## 2024-03-05 DIAGNOSIS — J3081 Allergic rhinitis due to animal (cat) (dog) hair and dander: Secondary | ICD-10-CM | POA: Diagnosis not present

## 2024-03-10 DIAGNOSIS — J3081 Allergic rhinitis due to animal (cat) (dog) hair and dander: Secondary | ICD-10-CM | POA: Diagnosis not present

## 2024-03-10 DIAGNOSIS — J301 Allergic rhinitis due to pollen: Secondary | ICD-10-CM | POA: Diagnosis not present

## 2024-03-10 DIAGNOSIS — J3089 Other allergic rhinitis: Secondary | ICD-10-CM | POA: Diagnosis not present

## 2024-03-12 ENCOUNTER — Encounter (HOSPITAL_COMMUNITY): Payer: Self-pay

## 2024-03-12 ENCOUNTER — Other Ambulatory Visit (HOSPITAL_COMMUNITY): Payer: Self-pay

## 2024-03-16 ENCOUNTER — Other Ambulatory Visit (HOSPITAL_COMMUNITY): Payer: Self-pay

## 2024-03-16 ENCOUNTER — Other Ambulatory Visit: Payer: Self-pay

## 2024-03-16 MED ORDER — CETIRIZINE HCL 10 MG PO TABS
10.0000 mg | ORAL_TABLET | Freq: Every day | ORAL | 1 refills | Status: AC
  Filled 2024-03-16: qty 30, 30d supply, fill #0

## 2024-03-17 ENCOUNTER — Other Ambulatory Visit: Payer: Self-pay

## 2024-03-17 ENCOUNTER — Other Ambulatory Visit (HOSPITAL_COMMUNITY): Payer: Self-pay

## 2024-03-17 MED ORDER — ALBUTEROL SULFATE HFA 108 (90 BASE) MCG/ACT IN AERS
2.0000 | INHALATION_SPRAY | RESPIRATORY_TRACT | 0 refills | Status: AC | PRN
  Filled 2024-03-17: qty 6.7, 17d supply, fill #0

## 2024-03-18 ENCOUNTER — Other Ambulatory Visit (HOSPITAL_COMMUNITY): Payer: Self-pay

## 2024-03-18 ENCOUNTER — Ambulatory Visit: Admitting: Family Medicine

## 2024-03-18 ENCOUNTER — Other Ambulatory Visit: Payer: Self-pay

## 2024-03-18 ENCOUNTER — Encounter: Payer: Self-pay | Admitting: Family Medicine

## 2024-03-18 ENCOUNTER — Other Ambulatory Visit (HOSPITAL_BASED_OUTPATIENT_CLINIC_OR_DEPARTMENT_OTHER): Payer: Self-pay

## 2024-03-18 VITALS — BP 110/82 | HR 65 | Temp 97.7°F | Ht 73.5 in | Wt 285.7 lb

## 2024-03-18 DIAGNOSIS — Z0000 Encounter for general adult medical examination without abnormal findings: Secondary | ICD-10-CM | POA: Diagnosis not present

## 2024-03-18 DIAGNOSIS — Z131 Encounter for screening for diabetes mellitus: Secondary | ICD-10-CM

## 2024-03-18 DIAGNOSIS — J301 Allergic rhinitis due to pollen: Secondary | ICD-10-CM | POA: Diagnosis not present

## 2024-03-18 DIAGNOSIS — J0100 Acute maxillary sinusitis, unspecified: Secondary | ICD-10-CM | POA: Diagnosis not present

## 2024-03-18 DIAGNOSIS — Z13220 Encounter for screening for lipoid disorders: Secondary | ICD-10-CM

## 2024-03-18 DIAGNOSIS — E669 Obesity, unspecified: Secondary | ICD-10-CM | POA: Diagnosis not present

## 2024-03-18 DIAGNOSIS — J3081 Allergic rhinitis due to animal (cat) (dog) hair and dander: Secondary | ICD-10-CM | POA: Diagnosis not present

## 2024-03-18 DIAGNOSIS — M722 Plantar fascial fibromatosis: Secondary | ICD-10-CM

## 2024-03-18 DIAGNOSIS — J3089 Other allergic rhinitis: Secondary | ICD-10-CM | POA: Diagnosis not present

## 2024-03-18 LAB — CBC WITH DIFFERENTIAL/PLATELET
Basophils Absolute: 0 K/uL (ref 0.0–0.1)
Basophils Relative: 0.8 % (ref 0.0–3.0)
Eosinophils Absolute: 0.1 K/uL (ref 0.0–0.7)
Eosinophils Relative: 3 % (ref 0.0–5.0)
HCT: 42.2 % (ref 39.0–52.0)
Hemoglobin: 14.5 g/dL (ref 13.0–17.0)
Lymphocytes Relative: 29.9 % (ref 12.0–46.0)
Lymphs Abs: 1.4 K/uL (ref 0.7–4.0)
MCHC: 34.4 g/dL (ref 30.0–36.0)
MCV: 92 fl (ref 78.0–100.0)
Monocytes Absolute: 0.4 K/uL (ref 0.1–1.0)
Monocytes Relative: 8.5 % (ref 3.0–12.0)
Neutro Abs: 2.8 K/uL (ref 1.4–7.7)
Neutrophils Relative %: 57.8 % (ref 43.0–77.0)
Platelets: 216 K/uL (ref 150.0–400.0)
RBC: 4.59 Mil/uL (ref 4.22–5.81)
RDW: 12.4 % (ref 11.5–15.5)
WBC: 4.8 K/uL (ref 4.0–10.5)

## 2024-03-18 LAB — COMPREHENSIVE METABOLIC PANEL WITH GFR
ALT: 36 U/L (ref 0–53)
AST: 26 U/L (ref 0–37)
Albumin: 4.5 g/dL (ref 3.5–5.2)
Alkaline Phosphatase: 65 U/L (ref 39–117)
BUN: 18 mg/dL (ref 6–23)
CO2: 27 meq/L (ref 19–32)
Calcium: 9.3 mg/dL (ref 8.4–10.5)
Chloride: 103 meq/L (ref 96–112)
Creatinine, Ser: 1.13 mg/dL (ref 0.40–1.50)
GFR: 83.46 mL/min (ref >60.00)
Glucose, Bld: 100 mg/dL — ABNORMAL HIGH (ref 70–99)
Potassium: 4.6 meq/L (ref 3.5–5.1)
Sodium: 137 meq/L (ref 135–145)
Total Bilirubin: 0.6 mg/dL (ref 0.2–1.2)
Total Protein: 7.1 g/dL (ref 6.0–8.3)

## 2024-03-18 LAB — LIPID PANEL
Cholesterol: 197 mg/dL (ref 0–200)
HDL: 46.2 mg/dL (ref >39.00)
LDL Cholesterol: 131 mg/dL — ABNORMAL HIGH (ref 0–99)
NonHDL: 150.9
Total CHOL/HDL Ratio: 4
Triglycerides: 101 mg/dL (ref 0.0–149.0)
VLDL: 20.2 mg/dL (ref 0.0–40.0)

## 2024-03-18 LAB — HEMOGLOBIN A1C: Hgb A1c MFr Bld: 5 % (ref 4.6–6.5)

## 2024-03-18 LAB — TSH: TSH: 2.32 u[IU]/mL (ref 0.35–5.50)

## 2024-03-18 MED ORDER — AMOXICILLIN-POT CLAVULANATE 875-125 MG PO TABS
1.0000 | ORAL_TABLET | Freq: Two times a day (BID) | ORAL | 0 refills | Status: AC
  Filled 2024-03-18: qty 14, 7d supply, fill #0

## 2024-03-18 NOTE — Patient Instructions (Signed)
 Health Maintenance, Male  Adopting a healthy lifestyle and getting preventive care are important in promoting health and wellness. Ask your health care provider about:  The right schedule for you to have regular tests and exams.  Things you can do on your own to prevent diseases and keep yourself healthy.  What should I know about diet, weight, and exercise?  Eat a healthy diet    Eat a diet that includes plenty of vegetables, fruits, low-fat dairy products, and lean protein.  Do not eat a lot of foods that are high in solid fats, added sugars, or sodium.  Maintain a healthy weight  Body mass index (BMI) is a measurement that can be used to identify possible weight problems. It estimates body fat based on height and weight. Your health care provider can help determine your BMI and help you achieve or maintain a healthy weight.  Get regular exercise  Get regular exercise. This is one of the most important things you can do for your health. Most adults should:  Exercise for at least 150 minutes each week. The exercise should increase your heart rate and make you sweat (moderate-intensity exercise).  Do strengthening exercises at least twice a week. This is in addition to the moderate-intensity exercise.  Spend less time sitting. Even light physical activity can be beneficial.  Watch cholesterol and blood lipids  Have your blood tested for lipids and cholesterol at 36 years of age, then have this test every 5 years.  You may need to have your cholesterol levels checked more often if:  Your lipid or cholesterol levels are high.  You are older than 35 years of age.  You are at high risk for heart disease.  What should I know about cancer screening?  Many types of cancers can be detected early and may often be prevented. Depending on your health history and family history, you may need to have cancer screening at various ages. This may include screening for:  Colorectal cancer.  Prostate cancer.  Skin cancer.  Lung  cancer.  What should I know about heart disease, diabetes, and high blood pressure?  Blood pressure and heart disease  High blood pressure causes heart disease and increases the risk of stroke. This is more likely to develop in people who have high blood pressure readings or are overweight.  Talk with your health care provider about your target blood pressure readings.  Have your blood pressure checked:  Every 3-5 years if you are 24-52 years of age.  Every year if you are 3 years old or older.  If you are between the ages of 60 and 72 and are a current or former smoker, ask your health care provider if you should have a one-time screening for abdominal aortic aneurysm (AAA).  Diabetes  Have regular diabetes screenings. This checks your fasting blood sugar level. Have the screening done:  Once every three years after age 66 if you are at a normal weight and have a low risk for diabetes.  More often and at a younger age if you are overweight or have a high risk for diabetes.  What should I know about preventing infection?  Hepatitis B  If you have a higher risk for hepatitis B, you should be screened for this virus. Talk with your health care provider to find out if you are at risk for hepatitis B infection.  Hepatitis C  Blood testing is recommended for:  Everyone born from [birth year] through 1963.  Anyone  with known risk factors for hepatitis C.  Sexually transmitted infections (STIs)  You should be screened each year for STIs, including gonorrhea and chlamydia, if:  You are sexually active and are younger than 36 years of age.  You are older than 36 years of age and your health care provider tells you that you are at risk for this type of infection.  Your sexual activity has changed since you were last screened, and you are at increased risk for chlamydia or gonorrhea. Ask your health care provider if you are at risk.  Ask your health care provider about whether you are at high risk for HIV. Your health care provider  may recommend a prescription medicine to help prevent HIV infection. If you choose to take medicine to prevent HIV, you should first get tested for HIV. You should then be tested every 3 months for as long as you are taking the medicine.  Follow these instructions at home:  Alcohol use  Do not drink alcohol if your health care provider tells you not to drink.  If you drink alcohol:  Limit how much you have to 0-2 drinks a day.  Know how much alcohol is in your drink. In the [place] one drink equals one 12 oz bottle of beer (355 mL), one 5 oz glass of wine (148 mL), or one 1 oz glass of hard liquor (44 mL).  Lifestyle  Do not use any products that contain nicotine or tobacco. These products include cigarettes, chewing tobacco, and vaping devices, such as e-cigarettes. If you need help quitting, ask your health care provider.  Do not use street drugs.  Do not share needles.  Ask your health care provider for help if you need support or information about quitting drugs.  General instructions  Schedule regular health, dental, and eye exams.  Stay current with your vaccines.  Tell your health care provider if:  You often feel depressed.  You have ever been abused or do not feel safe at home.  Summary  Adopting a healthy lifestyle and getting preventive care are important in promoting health and wellness.  Follow your health care provider's instructions about healthy diet, exercising, and getting tested or screened for diseases.  Follow your health care provider's instructions on monitoring your cholesterol and blood pressure.  This information is not intended to replace advice given to you by your health care provider. Make sure you discuss any questions you have with your health care provider.  Document Revised: 08/21/2020 Document Reviewed: 08/21/2020  Elsevier Patient Education  2024 [employer].

## 2024-03-18 NOTE — Progress Notes (Signed)
 Complete physical exam  Patient: Sean Allen   DOB: 24-Mar-1986   38 y.o. Male  MRN: [id number]  Subjective:    Chief Complaint  Patient presents with   Annual Exam    Sean Allen is a 38 y.o. male who presents today for a complete physical exam. He reports consuming a general diet. Gym/ health club routine includes cardio and light weights. He generally feels well. He reports sleeping well. He does not have additional problems to discuss today.    Most recent fall risk assessment:     No data to display           Most recent depression screenings:    12/03/2022    8:33 AM 02/27/2022    8:07 AM  PHQ 2/9 Scores  PHQ - 2 Score 0 0  PHQ- 9 Score 0  0      Data saved with a previous flowsheet row definition    Vision:Within last year and Dental: No current dental problems and Receives regular dental care  Patient Active Problem List   Diagnosis Date Noted   Plantar fasciitis 01/27/2022   Obesity (BMI 30-39.9) 11/13/2021   Allergic rhinitis due to animal (cat) (dog) hair and dander 11/22/2020   Mild intermittent asthma 11/22/2020   OSA (obstructive sleep apnea) 02/14/2015   Varicocele, evaluated by urologist in the past 08/30/2013   Hx of retinal detachment - [facility] 08/30/2013   Asthma - sees Allergy and Asthma specialist 08/30/2013   Allergic rhinitis 08/30/2013   GERD (gastroesophageal reflux disease) 08/30/2013      Patient Care Team: Ozell Heron HERO, MD as PCP - General (Family Medicine)   Outpatient Medications Prior to Visit  Medication Sig   albuterol  (VENTOLIN  HFA) 108 (90 Base) MCG/ACT inhaler Inhale 1-2 puffs into the lungs 4 - 6 hours as needed for cough/wheeze.   albuterol  (VENTOLIN  HFA) 108 (90 Base) MCG/ACT inhaler Inhale 2 puffs into the lungs every 4 (four) to 6 (six) hours as needed for cough/wheeze.   Ascorbic Acid (VITAMIN C) 1000 MG tablet Take 2,000 mg by mouth daily.   cetirizine  (ZYRTEC ) 10 MG tablet Take 1 tablet  (10 mg total) by mouth daily.   cetirizine  (ZYRTEC ) 10 MG tablet Take 1 tablet (10 mg total) by mouth daily.   EPINEPHrine  0.3 mg/0.3 mL IJ SOAJ injection Inject 0.3 mg into the muscle as needed.   Multiple Vitamins-Minerals (MULTIVITAMIN ADULT PO) Take by mouth.   Omega-3 Fatty Acids (FISH OIL PO) Take by mouth daily.   pantoprazole  (PROTONIX ) 20 MG tablet Take 1 tablet (20 mg total) by mouth daily as needed.   PRESCRIPTION MEDICATION Allergy injections by Dr Frutoso   tirzepatide  (ZEPBOUND ) 2.5 MG/0.5ML Pen Inject 2.5 mg into the skin once a week.   No facility-administered medications prior to visit.    Review of Systems  HENT:  Negative for hearing loss.   Eyes:  Negative for blurred vision.  Respiratory:  Negative for shortness of breath.   Cardiovascular:  Negative for chest pain.  Gastrointestinal: Negative.   Genitourinary: Negative.   Musculoskeletal:  Negative for back pain.  Neurological:  Negative for headaches.  Psychiatric/Behavioral:  Negative for depression.        Objective:     BP 110/82   Pulse 65   Temp 97.7 F (36.5 C) (Oral)   Ht 6' 1.5 (1.867 m)   Wt 285 lb 11.2 oz (129.6 kg)   SpO2 99%   BMI  37.18 kg/m    Physical Exam Vitals reviewed.  Constitutional:      Appearance: Normal appearance. He is well-groomed. He is obese.  HENT:     Right Ear: Tympanic membrane and ear canal normal.     Left Ear: Ear canal normal. Tympanic membrane is erythematous and bulging.     Nose:     Right Sinus: Maxillary sinus tenderness present.     Left Sinus: Maxillary sinus tenderness present.     Mouth/Throat:     Mouth: Mucous membranes are moist.     Pharynx: No posterior oropharyngeal erythema.  Eyes:     Extraocular Movements: Extraocular movements intact.     Conjunctiva/sclera: Conjunctivae normal.  Neck:     Thyroid : No thyromegaly.  Cardiovascular:     Rate and Rhythm: Normal rate and regular rhythm.     Heart sounds: S1 normal and S2 normal. No  murmur heard. Pulmonary:     Effort: Pulmonary effort is normal.     Breath sounds: Normal breath sounds and air entry. No rales.  Abdominal:     General: Abdomen is flat. Bowel sounds are normal.  Musculoskeletal:     Right lower leg: No edema.     Left lower leg: No edema.  Lymphadenopathy:     Cervical: No cervical adenopathy.  Neurological:     General: No focal deficit present.     Mental Status: He is alert and oriented to person, place, and time.     Gait: Gait is intact.  Psychiatric:        Mood and Affect: Mood and affect normal.      No results found for any visits on 03/18/24.     Assessment & Plan:    Routine Health Maintenance and Physical Exam  Immunization History  Administered Date(s) Administered   INFLUENZA, HIGH DOSE SEASONAL PF 04/09/2016, 05/19/2017, 05/11/2018, 06/25/2019   Influenza Split 01/23/2015   Influenza, Seasonal, Injecte, Preservative Fre 01/21/2024   Influenza,inj,Quad PF,6+ Mos 01/20/2018, 01/21/2020   Influenza-Unspecified 01/24/2017, 02/04/2019, 01/18/2021, 01/13/2022   PFIZER(Purple Top)SARS-COV-2 Vaccination 04/30/2019, 05/21/2019, 06/25/2019, 01/28/2020   PNEUMOCOCCAL CONJUGATE-20 02/26/2021   Tdap 12/06/2014    Health Maintenance  Topic Date Due   Hepatitis B Vaccines 19-59 Average Risk (1 of 3 - 19+ 3-dose series) Never done   HPV VACCINES (1 - 3-dose SCDM series) Never done   COVID-19 Vaccine (5 - 2025-26 season) 12/15/2023   DTaP/Tdap/Td (2 - Td or Tdap) 12/05/2024   Pneumococcal Vaccine  Completed   Influenza Vaccine  Completed   Hepatitis C Screening  Completed   HIV Screening  Completed   Meningococcal B Vaccine  Aged Out    Discussed health benefits of physical activity, and encouraged him to engage in regular exercise appropriate for his age and condition.  Lipid screening -     Lipid panel; Future  Routine general medical examination at a health care facility -     Comprehensive metabolic panel with GFR;  Future -     CBC with Differential/Platelet; Future  Acute non-recurrent maxillary sinusitis -     Amoxicillin -Pot Clavulanate; Take 1 tablet by mouth 2 (two) times daily for 7 days.  Dispense: 14 tablet; Refill: 0  Diabetes mellitus screening -     Hemoglobin A1c; Future  Obesity (BMI 30-39.9) -     TSH; Future  General physical exam findings are normal today except for the left ear infection noted on exam. Will treat with augmentin  for 7 days. I  reviewed the patient's preventative testing, immunizations, and lifestyle habits. I made appropriate recommendations and placed orders for the appropriate tests and/or vaccinations. I counseled the patient on the CDC's recommendations for healthy exercise and diet. I counseled the patient on healthy sleep habits and stress management. Handouts to reinforce the counseling were given at the conclusion of the visit.    Return in about 1 year (around 03/18/2025) for annual physical exam.     Heron CHRISTELLA Sharper, MD

## 2024-03-19 ENCOUNTER — Other Ambulatory Visit: Payer: Self-pay

## 2024-03-19 ENCOUNTER — Other Ambulatory Visit (HOSPITAL_COMMUNITY): Payer: Self-pay

## 2024-03-19 ENCOUNTER — Ambulatory Visit: Payer: Self-pay | Admitting: Family Medicine

## 2024-03-19 MED ORDER — MELOXICAM 15 MG PO TABS
15.0000 mg | ORAL_TABLET | Freq: Every day | ORAL | 5 refills | Status: AC | PRN
  Filled 2024-03-19: qty 30, 30d supply, fill #0

## 2024-03-23 ENCOUNTER — Other Ambulatory Visit: Payer: Self-pay | Admitting: Family Medicine

## 2024-03-23 ENCOUNTER — Other Ambulatory Visit (HOSPITAL_COMMUNITY): Payer: Self-pay

## 2024-03-23 DIAGNOSIS — K219 Gastro-esophageal reflux disease without esophagitis: Secondary | ICD-10-CM

## 2024-03-23 MED ORDER — PANTOPRAZOLE SODIUM 20 MG PO TBEC
20.0000 mg | DELAYED_RELEASE_TABLET | Freq: Every day | ORAL | 1 refills | Status: AC | PRN
  Filled 2024-03-23: qty 90, 90d supply, fill #0

## 2024-03-24 DIAGNOSIS — J3089 Other allergic rhinitis: Secondary | ICD-10-CM | POA: Diagnosis not present

## 2024-03-24 DIAGNOSIS — J301 Allergic rhinitis due to pollen: Secondary | ICD-10-CM | POA: Diagnosis not present

## 2024-03-24 DIAGNOSIS — J3081 Allergic rhinitis due to animal (cat) (dog) hair and dander: Secondary | ICD-10-CM | POA: Diagnosis not present

## 2024-04-02 DIAGNOSIS — J301 Allergic rhinitis due to pollen: Secondary | ICD-10-CM | POA: Diagnosis not present

## 2024-04-02 DIAGNOSIS — J3081 Allergic rhinitis due to animal (cat) (dog) hair and dander: Secondary | ICD-10-CM | POA: Diagnosis not present

## 2024-04-02 DIAGNOSIS — J3089 Other allergic rhinitis: Secondary | ICD-10-CM | POA: Diagnosis not present

## 2024-04-09 ENCOUNTER — Other Ambulatory Visit (HOSPITAL_COMMUNITY): Payer: Self-pay

## 2024-04-12 DIAGNOSIS — J301 Allergic rhinitis due to pollen: Secondary | ICD-10-CM | POA: Diagnosis not present

## 2024-04-12 DIAGNOSIS — J3081 Allergic rhinitis due to animal (cat) (dog) hair and dander: Secondary | ICD-10-CM | POA: Diagnosis not present

## 2024-04-12 DIAGNOSIS — J3089 Other allergic rhinitis: Secondary | ICD-10-CM | POA: Diagnosis not present

## 2024-04-17 ENCOUNTER — Telehealth: Admitting: Nurse Practitioner

## 2024-04-17 DIAGNOSIS — B9689 Other specified bacterial agents as the cause of diseases classified elsewhere: Secondary | ICD-10-CM

## 2024-04-17 DIAGNOSIS — J329 Chronic sinusitis, unspecified: Secondary | ICD-10-CM

## 2024-04-17 MED ORDER — AMOXICILLIN-POT CLAVULANATE 875-125 MG PO TABS: 1.0000 | ORAL_TABLET | Freq: Two times a day (BID) | ORAL | 0 refills | Status: AC

## 2024-04-17 MED ORDER — IPRATROPIUM BROMIDE 0.03 % NA SOLN: 2.0000 | Freq: Two times a day (BID) | NASAL | 0 refills | Status: AC

## 2024-04-17 NOTE — Progress Notes (Signed)
 E-Visit for Sinus Problems  We are sorry that you are not feeling well.  Here is how we plan to help!  Based on what you have shared with me it looks like you have sinusitis.  Sinusitis is inflammation and infection in the sinus cavities of the head.  Based on your presentation I believe you most likely have Acute Bacterial Sinusitis.  This is an infection caused by bacteria and is treated with antibiotics. I have prescribed Augmentin  875mg /125mg  one tablet twice daily with food, for 7 days. and I have also prescribed Ipratropium Bromide Nasal Spray Use 1 spray in each nostril twice daily as needed for drainage; discontinue if too drying You may use an oral decongestant such as Mucinex D or if you have glaucoma or high blood pressure use plain Mucinex. Saline nasal spray help and can safely be used as often as needed for congestion.  If you develop worsening sinus pain, fever or notice severe headache and vision changes, or if symptoms are not better after completion of antibiotic, please schedule an appointment with a health care provider.    Sinus infections are not as easily transmitted as other respiratory infection, however we still recommend that you avoid close contact with loved ones, especially the very young and elderly.  Remember to wash your hands thoroughly throughout the day as this is the number one way to prevent the spread of infection!  Home Care: Only take medications as instructed by your medical team. Complete the entire course of an antibiotic. Do not take these medications with alcohol. A steam or ultrasonic humidifier can help congestion.  You can place a towel over your head and breathe in the steam from hot water coming from a faucet. Avoid close contacts especially the very young and the elderly. Cover your mouth when you cough or sneeze. Always remember to wash your hands.  Get Help Right Away If: You develop worsening fever or sinus pain. You develop a severe head  ache or visual changes. Your symptoms persist after you have completed your treatment plan.  Make sure you Understand these instructions. Will watch your condition. Will get help right away if you are not doing well or get worse.  Your e-visit answers were reviewed by a board certified advanced clinical practitioner to complete your personal care plan.  Depending on the condition, your plan could have included both over the counter or prescription medications.  If there is a problem please reply  once you have received a response from your provider.  Your safety is important to us .  If you have drug allergies check your prescription carefully.    You can use MyChart to ask questions about today's visit, request a non-urgent call back, or ask for a work or school excuse for 24 hours related to this e-Visit. If it has been greater than 24 hours you will need to follow up with your provider, or enter a new e-Visit to address those concerns.  You will get an e-mail in the next two days asking about your experience.  I hope that your e-visit has been valuable and will speed your recovery. Thank you for using e-visits.  I have spent 5 minutes in review of e-visit questionnaire, review and updating patient chart, medical decision making and response to patient.   Deshaun Schou W Jonathan Kirkendoll, NP

## 2024-04-17 NOTE — Progress Notes (Signed)
 See my chart message

## 2024-04-27 ENCOUNTER — Other Ambulatory Visit (HOSPITAL_COMMUNITY): Payer: Self-pay

## 2024-05-14 ENCOUNTER — Encounter

## 2024-05-16 ENCOUNTER — Other Ambulatory Visit (HOSPITAL_COMMUNITY): Payer: Self-pay

## 2024-05-16 MED ORDER — ALBUTEROL SULFATE HFA 108 (90 BASE) MCG/ACT IN AERS
2.0000 | INHALATION_SPRAY | RESPIRATORY_TRACT | 0 refills | Status: AC
  Filled 2024-05-16: qty 6.7, 16d supply, fill #0

## 2024-05-17 ENCOUNTER — Other Ambulatory Visit (HOSPITAL_COMMUNITY): Payer: Self-pay

## 2024-05-17 ENCOUNTER — Encounter: Payer: Self-pay | Admitting: Family Medicine

## 2024-05-17 ENCOUNTER — Other Ambulatory Visit: Payer: Self-pay

## 2024-05-18 ENCOUNTER — Encounter (HOSPITAL_COMMUNITY): Payer: Self-pay

## 2024-05-18 ENCOUNTER — Other Ambulatory Visit (HOSPITAL_COMMUNITY): Payer: Self-pay

## 2025-03-21 ENCOUNTER — Encounter: Admitting: Family Medicine
